# Patient Record
Sex: Female | Born: 1965 | Race: White | Hispanic: No | Marital: Married | Smoking: Never smoker
Health system: Southern US, Community
[De-identification: ages and names within clinical notes are randomized; demographics above are authoritative.]

## PROBLEM LIST (undated history)

## (undated) DIAGNOSIS — F3281 Premenstrual dysphoric disorder: Secondary | ICD-10-CM

## (undated) DIAGNOSIS — C801 Malignant (primary) neoplasm, unspecified: Secondary | ICD-10-CM

## (undated) DIAGNOSIS — E039 Hypothyroidism, unspecified: Secondary | ICD-10-CM

## (undated) DIAGNOSIS — M1990 Unspecified osteoarthritis, unspecified site: Secondary | ICD-10-CM

## (undated) DIAGNOSIS — G4730 Sleep apnea, unspecified: Secondary | ICD-10-CM

## (undated) DIAGNOSIS — E7800 Pure hypercholesterolemia, unspecified: Secondary | ICD-10-CM

## (undated) DIAGNOSIS — T7840XA Allergy, unspecified, initial encounter: Secondary | ICD-10-CM

## (undated) DIAGNOSIS — E079 Disorder of thyroid, unspecified: Secondary | ICD-10-CM

## (undated) DIAGNOSIS — D649 Anemia, unspecified: Secondary | ICD-10-CM

## (undated) HISTORY — PX: BASAL CELL CARCINOMA EXCISION: SHX1214

## (undated) HISTORY — DX: Premenstrual dysphoric disorder: F32.81

## (undated) HISTORY — DX: Allergy, unspecified, initial encounter: T78.40XA

## (undated) HISTORY — DX: Pure hypercholesterolemia, unspecified: E78.00

## (undated) HISTORY — DX: Unspecified osteoarthritis, unspecified site: M19.90

## (undated) HISTORY — PX: TONSILLECTOMY: SUR1361

## (undated) HISTORY — DX: Sleep apnea, unspecified: G47.30

---

## 1998-01-03 ENCOUNTER — Other Ambulatory Visit: Admission: RE | Admit: 1998-01-03 | Discharge: 1998-01-03 | Payer: Self-pay | Admitting: Obstetrics and Gynecology

## 1998-02-18 ENCOUNTER — Other Ambulatory Visit: Admission: RE | Admit: 1998-02-18 | Discharge: 1998-02-18 | Payer: Self-pay | Admitting: Obstetrics and Gynecology

## 1998-03-07 ENCOUNTER — Encounter: Payer: Self-pay | Admitting: Obstetrics and Gynecology

## 1998-03-07 ENCOUNTER — Ambulatory Visit (HOSPITAL_COMMUNITY): Admission: RE | Admit: 1998-03-07 | Discharge: 1998-03-07 | Payer: Self-pay | Admitting: Obstetrics and Gynecology

## 1998-04-03 ENCOUNTER — Inpatient Hospital Stay (HOSPITAL_COMMUNITY): Admission: AD | Admit: 1998-04-03 | Discharge: 1998-04-03 | Payer: Self-pay | Admitting: Gynecology

## 1998-04-10 ENCOUNTER — Inpatient Hospital Stay (HOSPITAL_COMMUNITY): Admission: AD | Admit: 1998-04-10 | Discharge: 1998-04-10 | Payer: Self-pay | Admitting: Obstetrics and Gynecology

## 1998-05-25 ENCOUNTER — Inpatient Hospital Stay (HOSPITAL_COMMUNITY): Admission: AD | Admit: 1998-05-25 | Discharge: 1998-05-25 | Payer: Self-pay | Admitting: Obstetrics and Gynecology

## 1998-06-01 ENCOUNTER — Inpatient Hospital Stay (HOSPITAL_COMMUNITY): Admission: AD | Admit: 1998-06-01 | Discharge: 1998-06-01 | Payer: Self-pay | Admitting: Obstetrics and Gynecology

## 1998-07-15 ENCOUNTER — Other Ambulatory Visit: Admission: RE | Admit: 1998-07-15 | Discharge: 1998-07-15 | Payer: Self-pay | Admitting: Gynecology

## 1999-02-12 ENCOUNTER — Inpatient Hospital Stay (HOSPITAL_COMMUNITY): Admission: AD | Admit: 1999-02-12 | Discharge: 1999-02-14 | Payer: Self-pay | Admitting: Obstetrics and Gynecology

## 1999-02-16 ENCOUNTER — Encounter: Admission: RE | Admit: 1999-02-16 | Discharge: 1999-05-17 | Payer: Self-pay | Admitting: Gynecology

## 1999-03-28 ENCOUNTER — Other Ambulatory Visit: Admission: RE | Admit: 1999-03-28 | Discharge: 1999-03-28 | Payer: Self-pay | Admitting: Gynecology

## 1999-11-07 ENCOUNTER — Ambulatory Visit (HOSPITAL_COMMUNITY): Admission: RE | Admit: 1999-11-07 | Discharge: 1999-11-07 | Payer: Self-pay | Admitting: Obstetrics and Gynecology

## 1999-11-07 ENCOUNTER — Encounter: Payer: Self-pay | Admitting: Obstetrics and Gynecology

## 2000-03-27 ENCOUNTER — Other Ambulatory Visit: Admission: RE | Admit: 2000-03-27 | Discharge: 2000-03-27 | Payer: Self-pay | Admitting: Obstetrics and Gynecology

## 2000-11-19 ENCOUNTER — Ambulatory Visit (HOSPITAL_COMMUNITY): Admission: RE | Admit: 2000-11-19 | Discharge: 2000-11-19 | Payer: Self-pay | Admitting: Obstetrics and Gynecology

## 2000-11-19 ENCOUNTER — Encounter: Payer: Self-pay | Admitting: Obstetrics and Gynecology

## 2001-04-02 ENCOUNTER — Other Ambulatory Visit: Admission: RE | Admit: 2001-04-02 | Discharge: 2001-04-02 | Payer: Self-pay | Admitting: Obstetrics and Gynecology

## 2001-12-02 ENCOUNTER — Ambulatory Visit (HOSPITAL_COMMUNITY): Admission: RE | Admit: 2001-12-02 | Discharge: 2001-12-02 | Payer: Self-pay | Admitting: Obstetrics and Gynecology

## 2001-12-02 ENCOUNTER — Encounter: Payer: Self-pay | Admitting: Obstetrics and Gynecology

## 2001-12-06 ENCOUNTER — Inpatient Hospital Stay (HOSPITAL_COMMUNITY): Admission: AD | Admit: 2001-12-06 | Discharge: 2001-12-06 | Payer: Self-pay | Admitting: Obstetrics and Gynecology

## 2002-05-06 ENCOUNTER — Other Ambulatory Visit: Admission: RE | Admit: 2002-05-06 | Discharge: 2002-05-06 | Payer: Self-pay | Admitting: Obstetrics and Gynecology

## 2003-01-29 ENCOUNTER — Ambulatory Visit (HOSPITAL_COMMUNITY): Admission: RE | Admit: 2003-01-29 | Discharge: 2003-01-29 | Payer: Self-pay | Admitting: Obstetrics and Gynecology

## 2003-01-29 ENCOUNTER — Encounter: Payer: Self-pay | Admitting: Obstetrics and Gynecology

## 2003-05-17 ENCOUNTER — Other Ambulatory Visit: Admission: RE | Admit: 2003-05-17 | Discharge: 2003-05-17 | Payer: Self-pay | Admitting: Obstetrics and Gynecology

## 2004-02-01 ENCOUNTER — Ambulatory Visit (HOSPITAL_COMMUNITY): Admission: RE | Admit: 2004-02-01 | Discharge: 2004-02-01 | Payer: Self-pay | Admitting: Obstetrics and Gynecology

## 2004-05-30 ENCOUNTER — Other Ambulatory Visit: Admission: RE | Admit: 2004-05-30 | Discharge: 2004-05-30 | Payer: Self-pay | Admitting: Obstetrics and Gynecology

## 2005-02-01 ENCOUNTER — Ambulatory Visit (HOSPITAL_COMMUNITY): Admission: RE | Admit: 2005-02-01 | Discharge: 2005-02-01 | Payer: Self-pay | Admitting: Obstetrics and Gynecology

## 2005-03-08 ENCOUNTER — Ambulatory Visit: Payer: Self-pay | Admitting: Internal Medicine

## 2005-06-11 ENCOUNTER — Other Ambulatory Visit: Admission: RE | Admit: 2005-06-11 | Discharge: 2005-06-11 | Payer: Self-pay | Admitting: Obstetrics and Gynecology

## 2006-02-04 ENCOUNTER — Ambulatory Visit (HOSPITAL_COMMUNITY): Admission: RE | Admit: 2006-02-04 | Discharge: 2006-02-04 | Payer: Self-pay | Admitting: Obstetrics and Gynecology

## 2006-03-08 ENCOUNTER — Ambulatory Visit: Payer: Self-pay | Admitting: Internal Medicine

## 2006-04-10 ENCOUNTER — Ambulatory Visit: Payer: Self-pay | Admitting: Internal Medicine

## 2006-06-12 ENCOUNTER — Other Ambulatory Visit: Admission: RE | Admit: 2006-06-12 | Discharge: 2006-06-12 | Payer: Self-pay | Admitting: Obstetrics and Gynecology

## 2006-07-09 ENCOUNTER — Ambulatory Visit: Payer: Self-pay | Admitting: Internal Medicine

## 2007-02-10 ENCOUNTER — Encounter: Admission: RE | Admit: 2007-02-10 | Discharge: 2007-02-10 | Payer: Self-pay | Admitting: Obstetrics and Gynecology

## 2007-07-16 ENCOUNTER — Other Ambulatory Visit: Admission: RE | Admit: 2007-07-16 | Discharge: 2007-07-16 | Payer: Self-pay | Admitting: Obstetrics and Gynecology

## 2007-12-31 ENCOUNTER — Encounter: Admission: RE | Admit: 2007-12-31 | Discharge: 2007-12-31 | Payer: Self-pay | Admitting: Obstetrics and Gynecology

## 2008-02-18 ENCOUNTER — Encounter: Admission: RE | Admit: 2008-02-18 | Discharge: 2008-02-18 | Payer: Self-pay | Admitting: Obstetrics and Gynecology

## 2008-02-27 ENCOUNTER — Encounter: Admission: RE | Admit: 2008-02-27 | Discharge: 2008-02-27 | Payer: Self-pay | Admitting: Obstetrics and Gynecology

## 2008-03-04 ENCOUNTER — Ambulatory Visit: Payer: Self-pay | Admitting: Internal Medicine

## 2008-07-29 ENCOUNTER — Ambulatory Visit: Payer: Self-pay | Admitting: Obstetrics and Gynecology

## 2008-07-29 ENCOUNTER — Encounter: Payer: Self-pay | Admitting: Obstetrics and Gynecology

## 2008-07-29 ENCOUNTER — Other Ambulatory Visit: Admission: RE | Admit: 2008-07-29 | Discharge: 2008-07-29 | Payer: Self-pay | Admitting: Obstetrics and Gynecology

## 2009-01-10 ENCOUNTER — Encounter: Payer: Self-pay | Admitting: Internal Medicine

## 2009-03-08 ENCOUNTER — Encounter: Admission: RE | Admit: 2009-03-08 | Discharge: 2009-03-08 | Payer: Self-pay | Admitting: Obstetrics and Gynecology

## 2009-06-22 ENCOUNTER — Ambulatory Visit: Payer: Self-pay | Admitting: Obstetrics and Gynecology

## 2009-08-03 ENCOUNTER — Other Ambulatory Visit: Admission: RE | Admit: 2009-08-03 | Discharge: 2009-08-03 | Payer: Self-pay | Admitting: Obstetrics and Gynecology

## 2009-08-03 ENCOUNTER — Ambulatory Visit: Payer: Self-pay | Admitting: Obstetrics and Gynecology

## 2010-03-20 ENCOUNTER — Encounter: Admission: RE | Admit: 2010-03-20 | Discharge: 2010-03-20 | Payer: Self-pay | Admitting: Obstetrics and Gynecology

## 2010-05-21 LAB — CONVERTED CEMR LAB
ALT: 16 units/L (ref 0–40)
Alkaline Phosphatase: 46 units/L (ref 39–117)
Basophils Absolute: 0 10*3/uL (ref 0.0–0.1)
CO2: 26 meq/L (ref 19–32)
Calcium: 9.2 mg/dL (ref 8.4–10.5)
GFR calc non Af Amer: 73 mL/min
Glomerular Filtration Rate, Af Am: 89 mL/min/{1.73_m2}
Glucose, Bld: 83 mg/dL (ref 70–99)
Lymphocytes Relative: 16.5 % (ref 12.0–46.0)
MCV: 83.2 fL (ref 78.0–100.0)
Monocytes Absolute: 0.2 10*3/uL (ref 0.2–0.7)
Monocytes Relative: 3.1 % (ref 3.0–11.0)
Platelets: 259 10*3/uL (ref 150–400)
Potassium: 4.2 meq/L (ref 3.5–5.1)
RBC: 4.44 M/uL (ref 3.87–5.11)
Sodium: 140 meq/L (ref 135–145)
TSH: 2.34 microintl units/mL (ref 0.35–5.50)
Total Protein: 6.7 g/dL (ref 6.0–8.3)
WBC: 7.8 10*3/uL (ref 4.5–10.5)

## 2010-05-23 ENCOUNTER — Other Ambulatory Visit: Payer: Self-pay | Admitting: Dermatology

## 2010-08-30 ENCOUNTER — Other Ambulatory Visit (HOSPITAL_COMMUNITY)
Admission: RE | Admit: 2010-08-30 | Discharge: 2010-08-30 | Disposition: A | Discharge status: Home / Self Care | Payer: BC Managed Care – PPO | Source: Ambulatory Visit | Attending: Obstetrics and Gynecology | Admitting: Obstetrics and Gynecology

## 2010-08-30 ENCOUNTER — Encounter (INDEPENDENT_AMBULATORY_CARE_PROVIDER_SITE_OTHER): Payer: BC Managed Care – PPO | Admitting: Obstetrics and Gynecology

## 2010-08-30 ENCOUNTER — Other Ambulatory Visit: Payer: Self-pay | Admitting: Obstetrics and Gynecology

## 2010-08-30 DIAGNOSIS — Z01419 Encounter for gynecological examination (general) (routine) without abnormal findings: Secondary | ICD-10-CM

## 2010-08-30 DIAGNOSIS — Z124 Encounter for screening for malignant neoplasm of cervix: Secondary | ICD-10-CM | POA: Insufficient documentation

## 2010-08-30 DIAGNOSIS — B373 Candidiasis of vulva and vagina: Secondary | ICD-10-CM

## 2010-09-25 ENCOUNTER — Other Ambulatory Visit (INDEPENDENT_AMBULATORY_CARE_PROVIDER_SITE_OTHER): Payer: BC Managed Care – PPO

## 2010-09-25 DIAGNOSIS — Z13220 Encounter for screening for lipoid disorders: Secondary | ICD-10-CM

## 2011-01-04 ENCOUNTER — Other Ambulatory Visit: Payer: Self-pay | Admitting: *Deleted

## 2011-01-04 MED ORDER — FLUTICASONE PROPIONATE 50 MCG/ACT NA SUSP: 1.0000 | Freq: Every day | NASAL | Status: DC

## 2011-02-14 ENCOUNTER — Other Ambulatory Visit: Payer: Self-pay | Admitting: Obstetrics and Gynecology

## 2011-02-14 DIAGNOSIS — Z1231 Encounter for screening mammogram for malignant neoplasm of breast: Secondary | ICD-10-CM

## 2011-03-29 ENCOUNTER — Ambulatory Visit
Admission: RE | Admit: 2011-03-29 | Discharge: 2011-03-29 | Disposition: A | Discharge status: Home / Self Care | Payer: BC Managed Care – PPO | Source: Ambulatory Visit | Attending: Obstetrics and Gynecology | Admitting: Obstetrics and Gynecology

## 2011-03-29 DIAGNOSIS — Z1231 Encounter for screening mammogram for malignant neoplasm of breast: Secondary | ICD-10-CM

## 2011-04-11 ENCOUNTER — Ambulatory Visit (INDEPENDENT_AMBULATORY_CARE_PROVIDER_SITE_OTHER): Payer: BC Managed Care – PPO | Admitting: Family Medicine

## 2011-04-11 ENCOUNTER — Telehealth: Payer: Self-pay

## 2011-04-11 ENCOUNTER — Encounter: Payer: Self-pay | Admitting: Family Medicine

## 2011-04-11 VITALS — BP 110/65 | HR 87 | Temp 99.2°F | Ht 63.5 in | Wt 195.6 lb

## 2011-04-11 DIAGNOSIS — J111 Influenza due to unidentified influenza virus with other respiratory manifestations: Secondary | ICD-10-CM

## 2011-04-11 MED ORDER — OSELTAMIVIR PHOSPHATE 75 MG PO CAPS: 75.0000 mg | ORAL_CAPSULE | Freq: Two times a day (BID) | ORAL | Status: AC

## 2011-04-11 MED ORDER — GUAIFENESIN-CODEINE 100-10 MG/5ML PO SYRP: 10.0000 mL | ORAL_SOLUTION | Freq: Three times a day (TID) | ORAL | Status: AC | PRN

## 2011-04-11 NOTE — Patient Instructions (Signed)
This is the flu You can return to work 24 hrs after you are fever free Alternate tylenol and ibuprofen every 4 hrs as needed for pain and fever Drink plenty of fluids REST! Take the Tamiflu as directed Use the cough meds as needed Call with any questions or concerns Hang in there! Happy Holidays!

## 2011-04-11 NOTE — Progress Notes (Signed)
  Subjective:    Patient ID: Jordan Vang, female    DOB: 08-Apr-1966, 45 y.o.   MRN: [id number]  HPI Flu- sxs started Monday morning.  + cough, body aches, chills, watery eyes.  Tm- 'low 100s'.  No known sick contacts.   Review of Systems For ROS see HPI     Objective:   Physical Exam  Vitals reviewed. Constitutional: She appears well-developed and well-nourished. No distress.  HENT:  Head: Normocephalic and atraumatic.  Nose: Nose normal.  Mouth/Throat: Oropharynx is clear and moist. No oropharyngeal exudate.       TMs normal bilaterally No TTP over sinuses  Eyes: Conjunctivae and EOM are normal. Pupils are equal, round, and reactive to light.  Neck: Normal range of motion. Neck supple.  Pulmonary/Chest: Effort normal and breath sounds normal. No respiratory distress. She has no wheezes. She has no rales.       + dry cough  Lymphadenopathy:    She has no cervical adenopathy.  Skin: Skin is warm.          Assessment & Plan:

## 2011-04-11 NOTE — Telephone Encounter (Signed)
Message left on voicemail: Flu-like symptoms, ? If OV needed  I called and spoke with patient, patient with body aches,fever,chills and cough since Monday, patient advise she will need OV to be tested for flu. Patient to see Dr.Tabori at 2:15 today.

## 2011-04-15 NOTE — Assessment & Plan Note (Signed)
Flu test +.  Start Tamiflu.  Reviewed supportive care and red flags that should prompt return.  Pt expressed understanding and is in agreement w/ plan.

## 2011-05-04 ENCOUNTER — Telehealth: Payer: Self-pay

## 2011-05-04 NOTE — Telephone Encounter (Signed)
Pt states she was seen on 04/11/11 and diagnosed with flu.  Pt states she was given Tamiflu and is feeling better.  Pt would like to know if she needs to get a flu shot.  Pt aware the flu shot is still needed.

## 2011-06-30 LAB — HM PAP SMEAR

## 2011-08-22 ENCOUNTER — Encounter: Payer: Self-pay | Admitting: Gynecology

## 2011-08-22 DIAGNOSIS — F3281 Premenstrual dysphoric disorder: Secondary | ICD-10-CM | POA: Insufficient documentation

## 2011-08-24 ENCOUNTER — Telehealth: Payer: Self-pay | Admitting: Internal Medicine

## 2011-08-24 ENCOUNTER — Ambulatory Visit (INDEPENDENT_AMBULATORY_CARE_PROVIDER_SITE_OTHER): Payer: BC Managed Care – PPO | Admitting: Internal Medicine

## 2011-08-24 ENCOUNTER — Encounter: Payer: Self-pay | Admitting: Internal Medicine

## 2011-08-24 VITALS — BP 118/76 | HR 61 | Temp 98.2°F | Wt 191.4 lb

## 2011-08-24 DIAGNOSIS — T148XXA Other injury of unspecified body region, initial encounter: Secondary | ICD-10-CM

## 2011-08-24 DIAGNOSIS — W57XXXA Bitten or stung by nonvenomous insect and other nonvenomous arthropods, initial encounter: Secondary | ICD-10-CM

## 2011-08-24 DIAGNOSIS — J310 Chronic rhinitis: Secondary | ICD-10-CM

## 2011-08-24 DIAGNOSIS — H1010 Acute atopic conjunctivitis, unspecified eye: Secondary | ICD-10-CM

## 2011-08-24 DIAGNOSIS — T148 Other injury of unspecified body region: Secondary | ICD-10-CM

## 2011-08-24 MED ORDER — DOXYCYCLINE HYCLATE 100 MG PO TABS: 100.0000 mg | ORAL_TABLET | Freq: Two times a day (BID) | ORAL | Status: AC

## 2011-08-24 NOTE — Telephone Encounter (Signed)
Caller: Rekisha/Patient; PCP: Marga Melnick; CB#: [phone]; Call regarding Tick Bite Monday- 08/09/11 found tick on waist. She pulled it out and now has a little red mark. Flushed tick, not sure if she got whole thing.  Having Symptoms of Fatigue, achiness, Sore throat and Congestion onset- 08/19/11. Taking Muscinex- headaches on and off but was having them before bite d/t vision/ problems with contacts. Care advice per Bites and Stings Protocol and appnt scheduled for 1100- 08/24/11.

## 2011-08-24 NOTE — Progress Notes (Signed)
  Subjective:    Patient ID: Jordan Vang, female    DOB: 1966-01-27, 46 y.o.   MRN: [id number]  HPI 15 days ago she was bitten by a tick; she had no significant sequela until 4/29 when she developed myalgias , arthralgias as well as fatigue. Subsequently she developed some head & chest  congestion    Review of Systems she denies frontal headache, facial pain, dental pain, earache or otic discharge. She's had some yellow nasal drainage in the morning. She describes chest tightness without sputum production. She's had some nausea related to postnasal drainage. She states "I would not be here except for the tick bite".  She is using Allegra and Flonase for itchy eyes and sneezing. She also has eye drops.     Objective:   Physical Exam General appearance:good health ;well nourished; no acute distress or increased work of breathing is present.  No  lymphadenopathy about the head, neck, or axilla noted.   Eyes: No conjunctival inflammation or lid edema is present.   Ears:  External ear exam shows no significant lesions or deformities.  Otoscopic examination reveals clear canals, tympanic membranes are intact bilaterally without bulging, retraction, inflammation or discharge.  Nose:  External nasal examination shows no deformity or inflammation. Nasal mucosa are pink and moist without lesions or exudates. No septal dislocation or deviation.No obstruction to airflow.   Oral exam: Dental hygiene is good; lips and gums are healthy appearing.There is no oropharyngeal erythema or exudate noted.   Neck:  No deformities, masses, or tenderness noted.   Supple with full range of motion without pain.   Heart:  Normal rate and regular rhythm. S1 and S2 normal without gallop, murmur, click, rub or other extra sounds.   Lungs:Chest clear to auscultation; no wheezes, rhonchi,rales ,or rubs present.No increased work of breathing.    Extremities:  No cyanosis, edema, or clubbing  noted    Skin: Warm & dry  . 4 x 3 mm area of erythema above the right hip          Assessment & Plan:  #1 tick bite 15 days ago; no significant cellulitis  #2 respiratory tract symptoms with significant extrinsic component.  Plan: See orders and recommendations

## 2011-08-24 NOTE — Patient Instructions (Signed)
Plain Mucinex for thick secretions ;force NON dairy fluids . Use a Neti pot daily as needed for sinus congestion; going from open side to congested side . Nasal cleansing in the shower as discussed. Make sure that all residual soap is removed to prevent irritation. Fluticasone 1 spray in each nostril twice a day as needed. Use the "crossover" technique as discussed.  Avoid sun while on the doxycycline

## 2011-09-04 ENCOUNTER — Ambulatory Visit (INDEPENDENT_AMBULATORY_CARE_PROVIDER_SITE_OTHER): Payer: BC Managed Care – PPO | Admitting: Obstetrics and Gynecology

## 2011-09-04 ENCOUNTER — Encounter: Payer: Self-pay | Admitting: Obstetrics and Gynecology

## 2011-09-04 VITALS — BP 120/76 | Ht 63.0 in | Wt 189.0 lb

## 2011-09-04 DIAGNOSIS — Z01419 Encounter for gynecological examination (general) (routine) without abnormal findings: Secondary | ICD-10-CM

## 2011-09-04 DIAGNOSIS — E7800 Pure hypercholesterolemia, unspecified: Secondary | ICD-10-CM

## 2011-09-04 LAB — CBC WITH DIFFERENTIAL/PLATELET
Basophils Absolute: 0.1 10*3/uL (ref 0.0–0.1)
Basophils Relative: 1 % (ref 0–1)
HCT: 37.6 % (ref 36.0–46.0)
MCHC: 32.7 g/dL (ref 30.0–36.0)
Monocytes Absolute: 0.6 10*3/uL (ref 0.1–1.0)
Neutro Abs: 5.5 10*3/uL (ref 1.7–7.7)
Neutrophils Relative %: 65 % (ref 43–77)
Platelets: 309 10*3/uL (ref 150–400)
RDW: 13.8 % (ref 11.5–15.5)

## 2011-09-04 MED ORDER — FLUTICASONE PROPIONATE 50 MCG/ACT NA SUSP: 1.0000 | Freq: Every day | NASAL | Status: DC

## 2011-09-04 MED ORDER — DROSPIRENONE-ETHINYL ESTRADIOL 3-0.03 MG PO TABS: 1.0000 | ORAL_TABLET | Freq: Every day | ORAL | Status: DC

## 2011-09-04 MED ORDER — SERTRALINE HCL 25 MG PO TABS: 25.0000 mg | ORAL_TABLET | Freq: Every day | ORAL | Status: DC

## 2011-09-04 NOTE — Patient Instructions (Signed)
Return fasting for lipid profile. 

## 2011-09-04 NOTE — Progress Notes (Signed)
Patient came to see me today for her annual GYN exam. Her periods are fine on birth control pills. She is having no abnormal bleeding. She is having no pelvic pain. She takes Zoloft for PMDD with excellent results but it makes her tired. She really only has 2 days of PMDD but takes Zoloft every day of the year. She is up-to-date on mammograms. Last year her cholesterol was fine but her triglycerides were elevated at 349. The patient did not fast today. She is having no other GYN problems. Patient uses Flonase for seasonally with excellent results.  Physical examination: Kennon Portela present. HEENT within normal limits. Neck: Thyroid not large. No masses. Supraclavicular nodes: not enlarged. Breasts: Examined in both sitting and lying  position. No skin changes and no masses. Abdomen: Soft no guarding rebound or masses or hernia. Pelvic: External: Within normal limits. BUS: Within normal limits. Vaginal:within normal limits. Good estrogen effect. No evidence of cystocele rectocele or enterocele. Cervix: clean. Uterus: Normal size and shape. Adnexa: No masses. Rectovaginal exam: Confirmatory and negative. Extremities: Within normal limits.  Assessment: #1. PMDD #2. Elevated triglycerides #3. Allergies  Plan: Patient will only take her Zoloft cyclically to avoid the fatigue. She will continue yearly mammograms. She'll return fasting for lipid profile. She will continue Flonase that she uses seasonally.

## 2011-09-05 LAB — URINALYSIS W MICROSCOPIC + REFLEX CULTURE
Bacteria, UA: NONE SEEN
Bilirubin Urine: NEGATIVE
Casts: NONE SEEN
Crystals: NONE SEEN
Glucose, UA: NEGATIVE mg/dL
Hgb urine dipstick: NEGATIVE
Ketones, ur: NEGATIVE mg/dL
Specific Gravity, Urine: 1.018 (ref 1.005–1.030)
pH: 6 (ref 5.0–8.0)

## 2011-09-12 ENCOUNTER — Telehealth: Payer: Self-pay | Admitting: *Deleted

## 2011-09-12 NOTE — Telephone Encounter (Signed)
Pt informed with the below note. 

## 2011-09-12 NOTE — Telephone Encounter (Signed)
I. Would try just to switch to peak times now. I doubt if she'll have side effects. If she starts to feel funny in any way call me back and we will try differently.

## 2011-09-12 NOTE — Telephone Encounter (Signed)
Pt is currently taking her Zoloft 25 mg for her PMDD. She said at OV you spoke with her about taking medication only peak times of the months, she would like to know if she would need to wean herself on medication or could she just stop Zoloft and take at the peak times of the month? She has been taking medication daily for sometime now. Please advise

## 2011-09-19 ENCOUNTER — Other Ambulatory Visit: Payer: BC Managed Care – PPO

## 2011-09-19 DIAGNOSIS — E7800 Pure hypercholesterolemia, unspecified: Secondary | ICD-10-CM

## 2011-09-19 LAB — LIPID PANEL
LDL Cholesterol: 128 mg/dL — ABNORMAL HIGH (ref 0–99)
Triglycerides: 203 mg/dL — ABNORMAL HIGH (ref <150)

## 2011-09-20 ENCOUNTER — Other Ambulatory Visit: Payer: Self-pay | Admitting: *Deleted

## 2011-09-20 DIAGNOSIS — E7800 Pure hypercholesterolemia, unspecified: Secondary | ICD-10-CM

## 2011-11-01 ENCOUNTER — Other Ambulatory Visit: Payer: Self-pay | Admitting: Dermatology

## 2012-02-22 ENCOUNTER — Other Ambulatory Visit: Payer: Self-pay | Admitting: Obstetrics and Gynecology

## 2012-02-22 DIAGNOSIS — Z1231 Encounter for screening mammogram for malignant neoplasm of breast: Secondary | ICD-10-CM

## 2012-05-23 ENCOUNTER — Ambulatory Visit: Payer: BC Managed Care – PPO

## 2012-05-30 ENCOUNTER — Ambulatory Visit
Admission: RE | Admit: 2012-05-30 | Discharge: 2012-05-30 | Disposition: A | Discharge status: Home / Self Care | Payer: BC Managed Care – PPO | Source: Ambulatory Visit | Attending: Obstetrics and Gynecology | Admitting: Obstetrics and Gynecology

## 2012-05-30 DIAGNOSIS — Z1231 Encounter for screening mammogram for malignant neoplasm of breast: Secondary | ICD-10-CM

## 2012-08-29 ENCOUNTER — Encounter: Payer: Self-pay | Admitting: Gynecology

## 2012-09-16 ENCOUNTER — Encounter: Payer: Self-pay | Admitting: Gynecology

## 2012-09-16 ENCOUNTER — Ambulatory Visit (INDEPENDENT_AMBULATORY_CARE_PROVIDER_SITE_OTHER): Payer: BC Managed Care – PPO | Admitting: Gynecology

## 2012-09-16 VITALS — BP 124/76 | Ht 63.0 in | Wt 189.0 lb

## 2012-09-16 DIAGNOSIS — Z309 Encounter for contraceptive management, unspecified: Secondary | ICD-10-CM

## 2012-09-16 DIAGNOSIS — F3281 Premenstrual dysphoric disorder: Secondary | ICD-10-CM

## 2012-09-16 DIAGNOSIS — N951 Menopausal and female climacteric states: Secondary | ICD-10-CM

## 2012-09-16 DIAGNOSIS — N943 Premenstrual tension syndrome: Secondary | ICD-10-CM

## 2012-09-16 DIAGNOSIS — Z01419 Encounter for gynecological examination (general) (routine) without abnormal findings: Secondary | ICD-10-CM

## 2012-09-16 DIAGNOSIS — Z13220 Encounter for screening for lipoid disorders: Secondary | ICD-10-CM

## 2012-09-16 LAB — CBC WITH DIFFERENTIAL/PLATELET
Basophils Relative: 1 % (ref 0–1)
HCT: 36.8 % (ref 36.0–46.0)
Hemoglobin: 12.5 g/dL (ref 12.0–15.0)
Lymphocytes Relative: 26 % (ref 12–46)
MCHC: 34 g/dL (ref 30.0–36.0)
MCV: 79.8 fL (ref 78.0–100.0)
Monocytes Absolute: 0.5 10*3/uL (ref 0.1–1.0)
Monocytes Relative: 6 % (ref 3–12)
Neutro Abs: 5 10*3/uL (ref 1.7–7.7)

## 2012-09-16 LAB — COMPREHENSIVE METABOLIC PANEL
Albumin: 3.6 g/dL (ref 3.5–5.2)
Alkaline Phosphatase: 51 U/L (ref 39–117)
BUN: 12 mg/dL (ref 6–23)
Creat: 0.75 mg/dL (ref 0.50–1.10)
Glucose, Bld: 83 mg/dL (ref 70–99)
Potassium: 4.1 mEq/L (ref 3.5–5.3)
Total Bilirubin: 0.4 mg/dL (ref 0.3–1.2)

## 2012-09-16 LAB — LIPID PANEL
Cholesterol: 235 mg/dL — ABNORMAL HIGH (ref 0–200)
HDL: 57 mg/dL (ref >39)
Triglycerides: 179 mg/dL — ABNORMAL HIGH (ref <150)
VLDL: 36 mg/dL (ref 0–40)

## 2012-09-16 MED ORDER — SERTRALINE HCL 25 MG PO TABS: 25.0000 mg | ORAL_TABLET | Freq: Every day | ORAL | Status: DC

## 2012-09-16 MED ORDER — DROSPIRENONE-ETHINYL ESTRADIOL 3-0.03 MG PO TABS: 1.0000 | ORAL_TABLET | Freq: Every day | ORAL | Status: DC

## 2012-09-16 NOTE — Patient Instructions (Signed)
Followup in 1 year for exam. Followup for Mirena IUD if you choose.

## 2012-09-16 NOTE — Progress Notes (Signed)
Jordan Vang 08/12/1965 [id number]        47 y.o.  G1P1001 for annual exam.  Former patient of Dr. Eda Vang. Several issues noted below.  Past medical history,surgical history, medications, allergies, family history and social history were all reviewed and documented in the EPIC chart. ROS:  Was performed and pertinent positives and negatives are included in the history.  Exam: Jordan Vang assistant Filed Vitals:   09/16/12 0959  BP: 124/76  Height: 5\' 3"  (1.6 m)  Weight: 189 lb (85.73 kg)   General appearance  Normal Skin grossly normal Head/Neck normal with no cervical or supraclavicular adenopathy thyroid normal Lungs  clear Cardiac RR, without RMG Abdominal  soft, nontender, without masses, organomegaly or hernia Breasts  examined lying and sitting without masses, retractions, discharge or axillary adenopathy. Pelvic  Ext/BUS/vagina  normal   Cervix  normal   Uterus  anteverted, normal size, shape and contour, midline and mobile nontender   Adnexa  Without masses or tenderness    Anus and perineum  normal   Rectovaginal  normal sphincter tone without palpated masses or tenderness.    Assessment/Plan:  47 y.o. G28P1001 female for annual exam.   1. Yaz BCPs. Patient on Yaz equivalent BCPs.  Has been for years. I reviewed the issues and risks of BCPs in general and possible increased risk with Yaz to include stroke heart attack DVT. Alternative contraception options were reviewed to include IUD. I gave her literature on the Mirena IUD to consider. I refilled her Dianah Field today as she wants to continue and accepts the risks. Options for every other to every third month withdrawal discussed. Off brand labeling issues reviewed. 2. PMDD. Takes Zoloft 25 mg daily. Does well with this and wants to continue. Has been using for several years. Attempted to try taking only premenstrually and had side effects when she stopped them and prefers to take them daily.  I refilled her time to year. Having no  side effect issues with these. 3. Occasional hot flash. Does not seem worse during the pill free week. We'll check baseline TSH. 4. Mammography 05/2012. Continued annual mammography. SBE monthly reviewed. 5. Pap smear 2012. No Pap smear done today. No history of abnormal Pap smears. Plan repeat Pap smear next year at a 3 year interval. 6. Health maintenance. Baseline CBC comprehensive metabolic panel lipid profile urinalysis ordered along with her TSH. Followup 1 year, sooner she wants to proceed with the Mirena IUD.    Jordan Lords MD, 10:37 AM 09/16/2012  #6a

## 2012-09-17 ENCOUNTER — Other Ambulatory Visit: Payer: Self-pay | Admitting: Gynecology

## 2012-09-17 ENCOUNTER — Telehealth: Payer: Self-pay | Admitting: Gynecology

## 2012-09-17 DIAGNOSIS — E785 Hyperlipidemia, unspecified: Secondary | ICD-10-CM

## 2012-09-17 LAB — URINALYSIS W MICROSCOPIC + REFLEX CULTURE
Bacteria, UA: NONE SEEN
Bilirubin Urine: NEGATIVE
Casts: NONE SEEN
Crystals: NONE SEEN
Hgb urine dipstick: NEGATIVE
Ketones, ur: NEGATIVE mg/dL
Specific Gravity, Urine: 1.023 (ref 1.005–1.030)
Urobilinogen, UA: 0.2 mg/dL (ref 0.0–1.0)

## 2012-09-17 NOTE — Telephone Encounter (Signed)
09/17/12-Pt was advised today that her [facility] will pay for Mirena only after she has met her $5,450.00 deductible. To date only $370.86 met so she would have to pay $1,455.00 the total cost of the Mirena & insertion. She will get back to [place] if she wants to proceed.WL

## 2012-09-30 ENCOUNTER — Telehealth: Payer: Self-pay | Admitting: Internal Medicine

## 2012-09-30 MED ORDER — CIPROFLOXACIN HCL 500 MG PO TABS: 500.0000 mg | ORAL_TABLET | Freq: Two times a day (BID) | ORAL | Status: DC

## 2012-09-30 NOTE — Telephone Encounter (Signed)
Same cipro Rx OK

## 2012-09-30 NOTE — Telephone Encounter (Signed)
Left message on VM informing patient rx sent in

## 2012-09-30 NOTE — Telephone Encounter (Signed)
Jordan Vang/Patient [phone] called to request Rx for Cipro.  Leaving 10/04/12 for week in friend's home in [place].  Friend suggested she have Cipro in case of illness.  [employer]. Please call back to advise if MD approves or disapproves having Rx on hand.

## 2012-12-29 ENCOUNTER — Other Ambulatory Visit: Payer: Self-pay

## 2012-12-29 MED ORDER — FLUTICASONE PROPIONATE 50 MCG/ACT NA SUSP: 1.0000 | Freq: Every day | NASAL | Status: DC

## 2012-12-29 NOTE — Telephone Encounter (Signed)
Former Patient of Dr. Reece Agar.  Saw Dr. Velvet Bathe on 09/15/12 for CE.  At 09/04/11 CE Dr. Reece Agar wrote "Patient uses Flonase for seasonally with excellent results."

## 2013-03-11 ENCOUNTER — Other Ambulatory Visit: Payer: BC Managed Care – PPO

## 2013-03-11 DIAGNOSIS — E785 Hyperlipidemia, unspecified: Secondary | ICD-10-CM

## 2013-03-11 LAB — LIPID PANEL: HDL: 60 mg/dL (ref >39)

## 2013-04-23 HISTORY — PX: KNEE SURGERY: SHX244

## 2013-04-29 ENCOUNTER — Other Ambulatory Visit: Payer: Self-pay

## 2013-04-29 DIAGNOSIS — Z1231 Encounter for screening mammogram for malignant neoplasm of breast: Secondary | ICD-10-CM

## 2013-06-05 ENCOUNTER — Ambulatory Visit: Payer: BC Managed Care – PPO

## 2013-06-19 ENCOUNTER — Ambulatory Visit: Payer: BC Managed Care – PPO

## 2013-06-29 ENCOUNTER — Encounter: Payer: Self-pay | Admitting: Internal Medicine

## 2013-06-29 ENCOUNTER — Ambulatory Visit (INDEPENDENT_AMBULATORY_CARE_PROVIDER_SITE_OTHER): Payer: BC Managed Care – PPO | Admitting: Internal Medicine

## 2013-06-29 VITALS — BP 110/65 | Temp 98.0°F | Resp 14

## 2013-06-29 DIAGNOSIS — J209 Acute bronchitis, unspecified: Secondary | ICD-10-CM

## 2013-06-29 MED ORDER — AZITHROMYCIN 250 MG PO TABS: ORAL_TABLET | ORAL | Status: DC

## 2013-06-29 MED ORDER — HYDROCODONE-HOMATROPINE 5-1.5 MG/5ML PO SYRP: 5.0000 mL | ORAL_SOLUTION | Freq: Four times a day (QID) | ORAL | Status: DC | PRN

## 2013-06-29 NOTE — Progress Notes (Signed)
   Subjective:    Patient ID: Jordan Vang, female    DOB: 16-Jan-1966, 48 y.o.   MRN: [id number]  HPI  Symptoms began 06/23/13 as minor rhinitis, itchy, watery eyes, and sneezing as well as low-grade fever, chills, sweats. Significant symptoms included sore throat and chest congestion. Her cough is productive of clear to yellow sputum.  She had been exposed to sick work associates  Delsym, Human resources officer, Porcupine, DayQuil, Mucinex were of minimal benefit. Nonsteroidals were of more help than the other agents  At this time she continues to have some mild frontal sinus headache pain as well as some maxillary sinus area pain. Sore throat continues. The major issue remains the cough with clear yellow sputum. She's had some mild wheezing shortness of breath.  She also describes myalgias and arthralgias. She did take the flu shot   Review of Systems   There is no associated otic pain or discharge  The sinus symptoms are pressure without severe pain or nasal purulence.    Objective:   Physical Exam  General appearance:good health ;well nourished; no acute distress or increased work of breathing is present.  No  lymphadenopathy about the head, neck, or axilla noted.   Eyes: No conjunctival inflammation or lid edema is present. There is no scleral icterus.  Ears:  External ear exam shows no significant lesions or deformities.  Otoscopic examination reveals clear canals, tympanic membranes are intact bilaterally without bulging, retraction, inflammation or discharge.  Nose:  External nasal examination shows no deformity or inflammation. Nasal mucosa are pink and moist without lesions or exudates. No septal dislocation or deviation.No obstruction to airflow.   Oral exam: Dental hygiene is good; lips and gums are healthy appearing.There is no oropharyngeal erythema or exudate noted.   Neck:  No deformities, thyromegaly, masses, or tenderness noted.   Supple with full range of motion without pain.    Heart:  Normal rate and regular rhythm. S1 and S2 normal without gallop, murmur, click, rub or other extra sounds.   Lungs:Chest clear to auscultation; no wheezes, rhonchi,rales ,or rubs present.No increased work of breathing. Paroxysms of severe racking cough which was nonproductive    Extremities:  No cyanosis, edema, or clubbing  noted    Skin: Warm & dry w/o jaundice or tenting.          Assessment & Plan:   #1 acute bronchitis  #2 upper respiratory tract congestion without rhinosinusitis  Plan: See orders

## 2013-06-29 NOTE — Patient Instructions (Signed)

## 2013-06-29 NOTE — Progress Notes (Signed)
Pre visit review using our clinic review tool, if applicable. No additional management support is needed unless otherwise documented below in the visit note. 

## 2013-07-02 ENCOUNTER — Other Ambulatory Visit: Payer: Self-pay | Admitting: Internal Medicine

## 2013-07-02 ENCOUNTER — Telehealth: Payer: Self-pay | Admitting: Internal Medicine

## 2013-07-02 MED ORDER — BENZONATATE 200 MG PO CAPS: 200.0000 mg | ORAL_CAPSULE | Freq: Three times a day (TID) | ORAL | Status: DC | PRN

## 2013-07-02 NOTE — Telephone Encounter (Signed)
Patient called to inquire about being rx's Tessalon cough syrup to manage her cough during the day. She says that she does not take the Hydrocodone cough syrup during the day because she is afraid she might get dizzy. She has been told by friends that Tessalon might help and wants to know if Dr. Linna Darner will prescribe. Please advise.

## 2013-07-06 ENCOUNTER — Telehealth: Payer: Self-pay | Admitting: *Deleted

## 2013-07-06 NOTE — Telephone Encounter (Signed)
Pt called requesting last Tetanus.  After review of chart there is no Tetanus documented.  Left message for pt to return cal to [facility].

## 2013-07-10 ENCOUNTER — Ambulatory Visit: Payer: BC Managed Care – PPO

## 2013-07-28 ENCOUNTER — Ambulatory Visit (INDEPENDENT_AMBULATORY_CARE_PROVIDER_SITE_OTHER): Payer: BC Managed Care – PPO | Admitting: Gynecology

## 2013-07-28 DIAGNOSIS — Z23 Encounter for immunization: Secondary | ICD-10-CM

## 2013-07-30 ENCOUNTER — Ambulatory Visit
Admission: RE | Admit: 2013-07-30 | Discharge: 2013-07-30 | Disposition: A | Discharge status: Home / Self Care | Payer: BC Managed Care – PPO | Source: Ambulatory Visit

## 2013-07-30 DIAGNOSIS — Z1231 Encounter for screening mammogram for malignant neoplasm of breast: Secondary | ICD-10-CM

## 2013-09-18 ENCOUNTER — Encounter: Payer: Self-pay | Admitting: Gynecology

## 2013-09-18 ENCOUNTER — Ambulatory Visit (INDEPENDENT_AMBULATORY_CARE_PROVIDER_SITE_OTHER): Payer: BC Managed Care – PPO | Admitting: Gynecology

## 2013-09-18 ENCOUNTER — Other Ambulatory Visit (HOSPITAL_COMMUNITY)
Admission: RE | Admit: 2013-09-18 | Discharge: 2013-09-18 | Disposition: A | Discharge status: Home / Self Care | Payer: BC Managed Care – PPO | Source: Ambulatory Visit | Attending: Gynecology | Admitting: Gynecology

## 2013-09-18 VITALS — BP 120/76 | Ht 63.0 in | Wt 197.0 lb

## 2013-09-18 DIAGNOSIS — F3281 Premenstrual dysphoric disorder: Secondary | ICD-10-CM

## 2013-09-18 DIAGNOSIS — Z1151 Encounter for screening for human papillomavirus (HPV): Secondary | ICD-10-CM | POA: Insufficient documentation

## 2013-09-18 DIAGNOSIS — Z01419 Encounter for gynecological examination (general) (routine) without abnormal findings: Secondary | ICD-10-CM | POA: Insufficient documentation

## 2013-09-18 DIAGNOSIS — N943 Premenstrual tension syndrome: Secondary | ICD-10-CM

## 2013-09-18 MED ORDER — DROSPIRENONE-ETHINYL ESTRADIOL 3-0.03 MG PO TABS: 1.0000 | ORAL_TABLET | Freq: Every day | ORAL | Status: DC

## 2013-09-18 MED ORDER — SERTRALINE HCL 25 MG PO TABS: 25.0000 mg | ORAL_TABLET | Freq: Every day | ORAL | Status: DC

## 2013-09-18 NOTE — Progress Notes (Signed)
Jordan Vang 19-Apr-1966 [id number]        48 y.o.  G1P1001 for annual exam.  Doing well. Several issues noted below.  Past medical history,surgical history, problem list, medications, allergies, family history and social history were all reviewed and documented as reviewed in the EPIC chart.  ROS:  12 system ROS performed with pertinent positives and negatives included in the history, assessment and plan.  Included Systems: General, HEENT, Neck, Cardiovascular, Pulmonary, Gastrointestinal, Genitourinary, Musculoskeletal, Dermatologic, Endocrine, Hematological, Neurologic, Psychiatric Additional significant findings :  None   Exam: Kim assistant Filed Vitals:   09/18/13 0914  BP: 120/76  Height: 5\' 3"  (1.6 m)  Weight: 197 lb (89.359 kg)   General appearance:  Normal affect, orientation and appearance. Skin: Grossly normal HEENT: Without gross lesions.  No cervical or supraclavicular adenopathy. Thyroid normal.  Lungs:  Clear without wheezing, rales or rhonchi Cardiac: RR, without RMG Abdominal:  Soft, nontender, without masses, guarding, rebound, organomegaly or hernia Breasts:  Examined lying and sitting without masses, retractions, discharge or axillary adenopathy. Pelvic:  Ext/BUS/vagina normal  Cervix normal. Pap/HPV  Uterus anteverted, normal size, shape and contour, midline and mobile nontender   Adnexa  Without masses or tenderness    Anus and perineum  Normal   Rectovaginal  Normal sphincter tone without palpated masses or tenderness.    Assessment/Plan:  48 y.o. G26P1001 female for annual exam with regular menses, Yaz BCPs.   1. Yaz BCPs. I again reviewed the increased risk of stroke heart attack DVT associated with oral contraceptives, possibly advancing age and drospirenone. Options for alternative contraception reviewed or switching to a different pill. Patient has been on this for years, doing well and wants to continue. She clearly understands the issues and risks and  I refilled her x1 year. 2. PMDD. On Zoloft 25 mg daily doing well and wanting to continue. Refill x1 year prescribed. 3. Pap smear 2012. Pap smear/HPV today. No history of abnormal Pap smears previously. Plan repeat in 3-5 year interval is normal. 4. Mammography 07/2013. Continue with annual mammography. SBE monthly reviewed. 5. Health maintenance. Is in the process of arranging appointment with primary physician and will have her lab work done at her office. She does have a borderline cholesterol/LDL she can followup with them for this. Followup one year, sooner as needed.   Note: This document was prepared with digital dictation and possible smart phrase technology. Any transcriptional errors that result from this process are unintentional.   Anastasio Auerbach MD, 9:55 AM 09/18/2013

## 2013-09-18 NOTE — Addendum Note (Signed)
Addended by: Nelva Nay on: 09/18/2013 10:09 AM   Modules accepted: Orders

## 2013-09-18 NOTE — Addendum Note (Signed)
Addended by: Nelva Nay on: 09/18/2013 10:05 AM   Modules accepted: Orders

## 2013-09-18 NOTE — Patient Instructions (Signed)
Drospirenone; Ethinyl Estradiol tablets What is this medicine? DROSPIRENONE; ETHINYL ESTRADIOL (dro SPY re nown; ETH in il es tra DYE ole) is an oral contraceptive (birth control pill). This medicine combines two types of female hormones, an estrogen and a progestin. It is used to prevent ovulation and pregnancy. This medicine may be used for other purposes; ask your health care provider or pharmacist if you have questions. COMMON BRAND NAME(S): Angelica Chessman  What should I tell my health care provider before I take this medicine? They need to know if you have or ever had any of these conditions: -abnormal vaginal bleeding -adrenal gland disease -blood vessel disease or blood clots -breast, cervical, endometrial, ovarian, liver, or uterine cancer -diabetes -gallbladder disease -heart disease or recent heart attack -high blood pressure -high cholesterol -high potassium level -kidney disease -liver disease -migraine headaches -stroke -systemic lupus erythematosus (SLE) -tobacco smoker -an unusual or allergic reaction to estrogens, progestins, or other medicines, foods, dyes, or preservatives -pregnant or trying to get pregnant -breast-feeding How should I use this medicine? Take this medicine by mouth. To reduce nausea, this medicine may be taken with food. Follow the directions on the prescription label. Take this medicine at the same time each day and in the order directed on the package. Do not take your medicine more often than directed. A patient package insert for the product will be given with each prescription and refill. Read this sheet carefully each time. The sheet may change frequently. Talk to your pediatrician regarding the use of this medicine in children. Special care may be needed. This medicine has been used in female children who have started having menstrual periods. Overdosage: If you think you have taken too much of  this medicine contact a poison control center or emergency room at once. NOTE: This medicine is only for you. Do not share this medicine with others. What if I miss a dose? If you miss a dose, refer to the patient information sheet you received with your medicine for direction. If you miss more than one pill, this medicine may not be as effective and you may need to use another form of birth control. What may interact with this medicine? -acetaminophen -antibiotics or medicines for infections, especially rifampin, rifabutin, rifapentine, and griseofulvin, and possibly penicillins or tetracyclines -aprepitant -ascorbic acid (vitamin C) -atorvastatin -barbiturate medicines, such as phenobarbital -bosentan -carbamazepine -caffeine -clofibrate -cyclosporine -dantrolene -doxercalciferol -felbamate -grapefruit juice -hydrocortisone -medicines for anxiety or sleeping problems, such as diazepam or temazepam -medicines for diabetes, including pioglitazone -mineral oil -modafinil -mycophenolate -nefazodone -oxcarbazepine -phenytoin -prednisolone -ritonavir or other medicines for HIV infection or AIDS -rosuvastatin -selegiline -soy isoflavones supplements -St. John's wort -tamoxifen or raloxifene -theophylline -thyroid hormones -topiramate -warfarin This product is different from other birth control pills because it contains the progestin drospirenone. Drospirenone may increase potassium levels. Interactions with other drugs may increase the chance of an elevated potassium level. You may need blood tests to check your potassium level. Drugs that can increase the potassium level include: -certain medications for high blood pressure or heart conditions (examples include ACE-inhibitors and also Angiotensin-II receptor blockers, and Eplerenone -dietary salt substitutes (these may contain potassium) -heparin -NSAIDs (antiinflammatory drugs), if they are taken long-term and daily, like for  arthritis -potassium supplements -some 'water pills' (diuretics like amiloride, spironolactone or triamterene) This list may not describe all possible interactions. Give your health care provider a list of all the medicines, herbs, non-prescription drugs, or  dietary supplements you use. Also tell them if you smoke, drink alcohol, or use illegal drugs. Some items may interact with your medicine. What should I watch for while using this medicine? Visit your doctor or health care professional for regular checks on your progress. You will need a regular breast and pelvic exam and Pap smear while on this medicine. Use an additional method of contraception during the first cycle that you take these tablets. If you have any reason to think you are pregnant, stop taking this medicine right away and contact your doctor or health care professional. If you are taking this medicine for hormone related problems, it may take several cycles of use to see improvement in your condition. Smoking increases the risk of getting a blood clot or having a stroke while you are taking birth control pills, especially if you are more than 49 years old. You are strongly advised not to smoke. This medicine can make your body retain fluid, making your fingers, hands, or ankles swell. Your blood pressure can go up. Contact your doctor or health care professional if you feel you are retaining fluid. This medicine can make you more sensitive to the sun. Keep out of the sun. If you cannot avoid being in the sun, wear protective clothing and use sunscreen. Do not use sun lamps or tanning beds/booths. If you wear contact lenses and notice visual changes, or if the lenses begin to feel uncomfortable, consult your eye care specialist. In some women, tenderness, swelling, or minor bleeding of the gums may occur. Notify your dentist if this happens. Brushing and flossing your teeth regularly may help limit this. See your dentist regularly and  inform your dentist of the medicines you are taking. If you are going to have elective surgery, you may need to stop taking this medicine before the surgery. Consult your health care professional for advice. This medicine does not protect you against HIV infection (AIDS) or any other sexually transmitted diseases. What side effects may I notice from receiving this medicine? Side effects that you should report to your doctor or health care professional as soon as possible: -allergic reactions like skin rash, itching or hives, swelling of the face, lips, or tongue -breast tissue changes or discharge -changes in vision -chest pain -confusion, trouble speaking or understanding -dark urine -general ill feeling or flu-like symptoms -light-colored stools -nausea, vomiting -pain, swelling, warmth in the leg -right upper belly pain -severe headaches -shortness of breath -sudden numbness or weakness of the face, arm or leg -trouble walking, dizziness, loss of balance or coordination -unusual vaginal bleeding -yellowing of the eyes or skin Side effects that usually do not require medical attention (report to your doctor or health care professional if they continue or are bothersome): -acne -brown spots on the face -change in appetite -change in sexual desire -depressed mood or mood swings -fluid retention and swelling -stomach cramps or bloating -unusually weak or tired -weight gain This list may not describe all possible side effects. Call your doctor for medical advice about side effects. You may report side effects to FDA at 1-800-FDA-1088. Where should I keep my medicine? Keep out of the reach of children. Store at room temperature between 15 and 30 degrees C (59 and 86 degrees F). Throw away any unused medicine after the expiration date. NOTE: This sheet is a summary. It may not cover all possible information. If you have questions about this medicine, talk to your doctor, pharmacist,  or health care provider.    2014, [employer]. (2008-03-25 13:02:54)  

## 2013-09-19 LAB — URINALYSIS W MICROSCOPIC + REFLEX CULTURE
BACTERIA UA: NONE SEEN
Bilirubin Urine: NEGATIVE
Casts: NONE SEEN
Crystals: NONE SEEN
GLUCOSE, UA: NEGATIVE mg/dL
HGB URINE DIPSTICK: NEGATIVE
KETONES UR: NEGATIVE mg/dL
Leukocytes, UA: NEGATIVE
Nitrite: NEGATIVE
PH: 6 (ref 5.0–8.0)
Protein, ur: NEGATIVE mg/dL
Specific Gravity, Urine: 1.03 (ref 1.005–1.030)
Squamous Epithelial / LPF: NONE SEEN
Urobilinogen, UA: 0.2 mg/dL (ref 0.0–1.0)

## 2013-10-28 ENCOUNTER — Encounter: Payer: Self-pay | Admitting: Internal Medicine

## 2013-10-28 ENCOUNTER — Ambulatory Visit (INDEPENDENT_AMBULATORY_CARE_PROVIDER_SITE_OTHER): Payer: BC Managed Care – PPO | Admitting: Internal Medicine

## 2013-10-28 VITALS — BP 102/80 | HR 82 | Temp 98.5°F | Ht 63.0 in | Wt 196.2 lb

## 2013-10-28 DIAGNOSIS — C4401 Basal cell carcinoma of skin of lip: Secondary | ICD-10-CM | POA: Insufficient documentation

## 2013-10-28 DIAGNOSIS — Z0000 Encounter for general adult medical examination without abnormal findings: Secondary | ICD-10-CM

## 2013-10-28 DIAGNOSIS — E669 Obesity, unspecified: Secondary | ICD-10-CM | POA: Insufficient documentation

## 2013-10-28 DIAGNOSIS — E7800 Pure hypercholesterolemia, unspecified: Secondary | ICD-10-CM

## 2013-10-28 NOTE — Patient Instructions (Signed)
Your next office appointment will be determined based upon review of your pending labs . Those instructions will be transmitted to you through My Chart  OR  by mail 

## 2013-10-28 NOTE — Progress Notes (Signed)
Pre visit review using our clinic review tool, if applicable. No additional management support is needed unless otherwise documented below in the visit note. 

## 2013-10-28 NOTE — Progress Notes (Signed)
   Subjective:    Patient ID: Jordan Vang, female    DOB: November 29, 1965, 48 y.o.   MRN: [id number]  HPI  She is here for a physical;acute issues denied.  A modified heart healthy diet is followed; exercise encompasses 30-45 minutes 2-4 times per week as  walking without symptoms.  Family history is ? for premature coronary disease. Advanced cholesterol testing reveals  LDL goal is less than 100 ; ideally < 70. To date no statin.  Low dose ASA not taken   Review of Systems Specifically denied are  chest pain, palpitations, dyspnea,edema, PND or claudication.        Objective:   Physical Exam Gen.: Healthy and well-nourished in appearance. Alert, appropriate and cooperative throughout exam. Appears younger than stated age  Head: Normocephalic without obvious abnormalities Eyes: No corneal or conjunctival inflammation noted. Pupils equal round reactive to light and accommodation. Extraocular motion intact. Ears: External  ear exam reveals no significant lesions or deformities. Canals clear .TMs normal. Hearing is grossly normal bilaterally. Nose: External nasal exam reveals no deformity or inflammation. Nasal mucosa are pink and moist. No lesions or exudates noted.   Mouth: Oral mucosa and oropharynx reveal no lesions or exudates. Teeth in good repair. Neck: No deformities, masses, or tenderness noted. Range of motion & Thyroid normal.. Lungs: Normal respiratory effort; chest expands symmetrically. Lungs are clear to auscultation without rales, wheezes, or increased work of breathing. Heart: Normal rate and rhythm. Normal S1 and S2. No gallop, click, or rub. S4 w/o murmur. Abdomen: Bowel sounds normal; abdomen soft and nontender. No masses, organomegaly or hernias noted. Genitalia:  as per Gyn                                  Musculoskeletal/extremities: No deformity or scoliosis noted of  the thoracic or lumbar spine.  No clubbing, cyanosis, edema, or significant extremity  deformity  noted. Range of motion normal .Tone & strength normal. Hand joints normal  Fingernail / toenail health good. Able to lie down & sit up w/o help. Negative SLR bilaterally Vascular: Carotid, radial artery, dorsalis pedis and  posterior tibial pulses are full and equal. No bruits present. Neurologic: Alert and oriented x3. Deep tendon reflexes symmetrical and normal.  Gait normal.      Skin: Intact without suspicious lesions or rashes.Vascular nevus 5th L toe medially. Lymph: No cervical, axillary lymphadenopathy present. Psych: Mood and affect are normal. Normally interactive                                                                                        Assessment & Plan:  #1 comprehensive physical exam; no acute findings  Plan: see Orders  & Recommendations

## 2013-11-06 ENCOUNTER — Other Ambulatory Visit (INDEPENDENT_AMBULATORY_CARE_PROVIDER_SITE_OTHER): Payer: BC Managed Care – PPO

## 2013-11-06 DIAGNOSIS — Z0000 Encounter for general adult medical examination without abnormal findings: Secondary | ICD-10-CM

## 2013-11-06 LAB — CBC WITH DIFFERENTIAL/PLATELET
BASOS ABS: 0 10*3/uL (ref 0.0–0.1)
Basophils Relative: 0.4 % (ref 0.0–3.0)
EOS ABS: 0.1 10*3/uL (ref 0.0–0.7)
Eosinophils Relative: 1.6 % (ref 0.0–5.0)
HCT: 39.1 % (ref 36.0–46.0)
Hemoglobin: 13.1 g/dL (ref 12.0–15.0)
LYMPHS PCT: 26.8 % (ref 12.0–46.0)
Lymphs Abs: 2.5 10*3/uL (ref 0.7–4.0)
MCHC: 33.5 g/dL (ref 30.0–36.0)
MCV: 82.8 fl (ref 78.0–100.0)
MONOS PCT: 6.2 % (ref 3.0–12.0)
Monocytes Absolute: 0.6 10*3/uL (ref 0.1–1.0)
NEUTROS ABS: 6.1 10*3/uL (ref 1.4–7.7)
Neutrophils Relative %: 65 % (ref 43.0–77.0)
PLATELETS: 308 10*3/uL (ref 150.0–400.0)
RBC: 4.73 Mil/uL (ref 3.87–5.11)
RDW: 13.6 % (ref 11.5–15.5)
WBC: 9.4 10*3/uL (ref 4.0–10.5)

## 2013-11-06 LAB — LIPID PANEL
CHOLESTEROL: 224 mg/dL — AB (ref 0–200)
HDL: 61.2 mg/dL (ref >39.00)
LDL Cholesterol: 119 mg/dL — ABNORMAL HIGH (ref 0–99)
NONHDL: 162.8
Total CHOL/HDL Ratio: 4
Triglycerides: 220 mg/dL — ABNORMAL HIGH (ref 0.0–149.0)
VLDL: 44 mg/dL — ABNORMAL HIGH (ref 0.0–40.0)

## 2013-11-06 LAB — BASIC METABOLIC PANEL
BUN: 16 mg/dL (ref 6–23)
CHLORIDE: 107 meq/L (ref 96–112)
CO2: 24 meq/L (ref 19–32)
Calcium: 9.3 mg/dL (ref 8.4–10.5)
Creatinine, Ser: 0.8 mg/dL (ref 0.4–1.2)
GFR: 77.76 mL/min (ref >60.00)
GLUCOSE: 92 mg/dL (ref 70–99)
POTASSIUM: 4.3 meq/L (ref 3.5–5.1)
Sodium: 137 mEq/L (ref 135–145)

## 2013-11-06 LAB — HEPATIC FUNCTION PANEL
ALBUMIN: 3.5 g/dL (ref 3.5–5.2)
ALK PHOS: 58 U/L (ref 39–117)
ALT: 15 U/L (ref 0–35)
AST: 13 U/L (ref 0–37)
BILIRUBIN TOTAL: 0.4 mg/dL (ref 0.2–1.2)
Bilirubin, Direct: 0.1 mg/dL (ref 0.0–0.3)
Total Protein: 6.9 g/dL (ref 6.0–8.3)

## 2013-11-06 LAB — TSH: TSH: 6.22 u[IU]/mL — ABNORMAL HIGH (ref 0.35–4.50)

## 2013-11-13 ENCOUNTER — Other Ambulatory Visit: Payer: Self-pay | Admitting: Internal Medicine

## 2013-11-13 ENCOUNTER — Telehealth: Payer: Self-pay

## 2013-11-13 DIAGNOSIS — R946 Abnormal results of thyroid function studies: Secondary | ICD-10-CM

## 2013-11-13 NOTE — Telephone Encounter (Signed)
Message copied by Shelly Coss on Fri Nov 13, 2013  8:09 AM ------      Message from: Hendricks Limes      Created: Fri Nov 13, 2013  7:22 AM       For some reason EMR won't let me release labs.Please copy labs & add following comments:      Risk of premature heart attack or stroke increases as LDL or BAD cholesterol rises, especially if there is family history of heart attack in males before 71 or women before 50. Based on your prior advanced testing, your LDL goal is < 100 , ideally < 70.Your present LDL increases long term heart attack or stroke risk 19 %.The best dietary  information on cholesterol is Dr Nunzio Cory book Eat, Drink & Be Healthy.      The most common cause of elevated triglycerides (TG) is the ingestion of sugar from high fructose corn syrup sources added to processed foods & drinks.  Eat a low-fat diet with lots of fruits and vegetables, up to 7-9 servings per day. Consume less than  30  Grams (preferably ZERO) of sugar per day from foods & drinks with High Fructose Corn Syrup (HFCS) sugar as #1,2,3 or # 4 on label.[employer], [employer] do not carry products with HFCS.      TSH (Thyroid Stimulating Hormone) normal range = 0.35- 4.50. Ideal value is 1-3.         A value > 4.50 suggests possible early  HYPOthyroid state from decreased production of thyroid gland hormones. Please repeat TSH with Free T4 to confirm thyroid function status.Hopp       ------

## 2013-11-13 NOTE — Telephone Encounter (Signed)
Mailed along with labs

## 2013-11-19 ENCOUNTER — Other Ambulatory Visit (INDEPENDENT_AMBULATORY_CARE_PROVIDER_SITE_OTHER): Payer: BC Managed Care – PPO

## 2013-11-19 DIAGNOSIS — R946 Abnormal results of thyroid function studies: Secondary | ICD-10-CM

## 2013-11-19 LAB — TSH: TSH: 4.57 u[IU]/mL — ABNORMAL HIGH (ref 0.35–4.50)

## 2013-11-19 LAB — T4, FREE: Free T4: 0.8 ng/dL (ref 0.60–1.60)

## 2013-11-21 ENCOUNTER — Other Ambulatory Visit: Payer: Self-pay | Admitting: Internal Medicine

## 2013-11-21 DIAGNOSIS — R946 Abnormal results of thyroid function studies: Secondary | ICD-10-CM

## 2014-01-27 ENCOUNTER — Other Ambulatory Visit: Payer: Self-pay | Admitting: Gynecology

## 2014-01-27 NOTE — Telephone Encounter (Signed)
Dr.Gottesgen prescribed on OV 09/04/2011 "Patient uses Flonase for seasonally with excellent results."

## 2014-02-22 ENCOUNTER — Encounter: Payer: Self-pay | Admitting: Internal Medicine

## 2014-05-27 ENCOUNTER — Other Ambulatory Visit: Payer: Self-pay | Admitting: Internal Medicine

## 2014-05-27 DIAGNOSIS — Z20828 Contact with and (suspected) exposure to other viral communicable diseases: Secondary | ICD-10-CM

## 2014-05-27 MED ORDER — OSELTAMIVIR PHOSPHATE 75 MG PO CAPS: 75.0000 mg | ORAL_CAPSULE | Freq: Every day | ORAL | Status: DC

## 2014-06-28 ENCOUNTER — Other Ambulatory Visit: Payer: Self-pay

## 2014-06-28 DIAGNOSIS — Z1231 Encounter for screening mammogram for malignant neoplasm of breast: Secondary | ICD-10-CM

## 2014-06-28 DIAGNOSIS — Z803 Family history of malignant neoplasm of breast: Secondary | ICD-10-CM

## 2014-07-06 ENCOUNTER — Ambulatory Visit (INDEPENDENT_AMBULATORY_CARE_PROVIDER_SITE_OTHER): Payer: BLUE CROSS/BLUE SHIELD

## 2014-07-06 ENCOUNTER — Ambulatory Visit (INDEPENDENT_AMBULATORY_CARE_PROVIDER_SITE_OTHER): Payer: BLUE CROSS/BLUE SHIELD | Admitting: Podiatry

## 2014-07-06 ENCOUNTER — Encounter: Payer: Self-pay | Admitting: Podiatry

## 2014-07-06 VITALS — BP 108/67 | HR 81 | Resp 18

## 2014-07-06 DIAGNOSIS — M205X1 Other deformities of toe(s) (acquired), right foot: Secondary | ICD-10-CM | POA: Diagnosis not present

## 2014-07-06 DIAGNOSIS — R52 Pain, unspecified: Secondary | ICD-10-CM

## 2014-07-06 DIAGNOSIS — M779 Enthesopathy, unspecified: Secondary | ICD-10-CM | POA: Diagnosis not present

## 2014-07-06 MED ORDER — TRIAMCINOLONE ACETONIDE 10 MG/ML IJ SUSP
10.0000 mg | Freq: Once | INTRAMUSCULAR | Status: AC
  Administered 2014-07-06: 10 mg

## 2014-07-06 NOTE — Progress Notes (Signed)
Subjective:     Patient ID: Jordan Vang, female   DOB: Aug 28, 1965, 49 y.o.   MRN: [id number]  HPI patient states I have a lot of pain on the outside of my right foot that makes shoe gear at times uncomfortable and there has been fluid in it. It started about 2-3 months ago when I was in [place] other systems reviewed and are negative.      Objective:   Physical Exam  Constitutional: She is oriented to person, place, and time.  Cardiovascular: Intact distal pulses.   Musculoskeletal: Normal range of motion.  Neurological: She is oriented to person, place, and time.  Skin: Skin is warm.  Nursing note and vitals reviewed.  neurovascular status intact with muscle strength adequate and range of motion subtalar and midtarsal joint within normal limits. Patient's noted to have inflammation and fluid around the fifth metatarsal head right and it is mildly to moderately tender when pressed and red and sore especially with certain types of shoes. 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 Today I went ahead and I discussed treatment options and discussed wider shoes and soaks. I did inject the capsule with 3 mg Kenalog 5 mg Xylocaine and advised on possible bursal resection or tailor bunionectomy if symptoms persist

## 2014-07-06 NOTE — Progress Notes (Signed)
   Subjective:    Patient ID: Jordan Vang, female    DOB: Apr 03, 1966, 49 y.o.   MRN: [id number]  HPI LATE January THIS YEAR WE WENT TO [place] AND A PLACE POPPED UP ON MY RIGHT 5TH TOE AND HURTS WHEN I WALK AND IS SORE AND TENDER    Review of Systems     Objective:   Physical Exam        Assessment & Plan:

## 2014-07-14 ENCOUNTER — Ambulatory Visit: Payer: BLUE CROSS/BLUE SHIELD | Admitting: Podiatry

## 2014-07-19 ENCOUNTER — Ambulatory Visit: Payer: BLUE CROSS/BLUE SHIELD | Admitting: Podiatry

## 2014-08-02 ENCOUNTER — Encounter: Payer: Self-pay | Admitting: Internal Medicine

## 2014-08-02 ENCOUNTER — Ambulatory Visit (INDEPENDENT_AMBULATORY_CARE_PROVIDER_SITE_OTHER): Payer: BLUE CROSS/BLUE SHIELD | Admitting: Internal Medicine

## 2014-08-02 VITALS — BP 120/70 | HR 77 | Temp 98.2°F | Ht 63.0 in | Wt 200.2 lb

## 2014-08-02 DIAGNOSIS — R7989 Other specified abnormal findings of blood chemistry: Secondary | ICD-10-CM | POA: Insufficient documentation

## 2014-08-02 DIAGNOSIS — R946 Abnormal results of thyroid function studies: Secondary | ICD-10-CM | POA: Diagnosis not present

## 2014-08-02 DIAGNOSIS — E7800 Pure hypercholesterolemia, unspecified: Secondary | ICD-10-CM

## 2014-08-02 NOTE — Patient Instructions (Signed)
Please follow a Mediaterranean type diet  (many good cook books readily available) or review Dr Nunzio Cory book Eat, Alcorn for best  dietary cholesterol information & options.   Continue excellent cardiovascular exercise program 30-45 minutes 3-4 times per week.   An advanced cholesterol panel (NMR Lipoprofile Lipid Panel)  & TSH after 4 months of nutritional & exercise changes is best way to optimally assess long LDL risk.

## 2014-08-02 NOTE — Progress Notes (Signed)
Pre visit review using our clinic review tool, if applicable. No additional management support is needed unless otherwise documented below in the visit note. 

## 2014-08-02 NOTE — Progress Notes (Signed)
   Subjective:    Patient ID: Jordan Vang, female    DOB: 11/08/1965, 49 y.o.   MRN: [id number]  HPI She is going to [place] in  July for a mission trip.She is inquiring as to  which immunizations & medications are indicated. I have recommended that she go to the [facility] for optimal assessment and recommendations.  In reviewing the chart she does have significant dyslipidemia. Based on an NMR LipoProfile 2007 her LDL goal is less than 100, ideally less than 70. Her most recent lipid panel in July 2015 revealed triglycerides of 220; HDL 61.2; LDL of 119. TSH was therapeutic at that time.TSH was high normal with value of 4.57.  She is on modified heart healthy diet eating red meat once a week. She is walking biking and engaged in yoga 5-6 hours per week without symptoms.  Her father had a stroke at 72. Maternal grandfather had heart disease ; she's unsure of his age @ onset.  Review of Systems  Chest pain, palpitations, tachycardia, exertional dyspnea, paroxysmal nocturnal dyspnea, claudication or edema are absent.      Objective:   Physical Exam  Appears healthy and well-nourished & in no acute distress  No carotid bruits are present.No neck vein distention present at 10 - 15 degrees. Thyroid normal to palpation  Heart rhythm and rate are normal with no gallop or murmur  Chest is clear with no increased work of breathing  There is no evidence of aortic aneurysm or renal artery bruits  Abdomen soft with no organomegaly or masses. No HJR  No clubbing, cyanosis or edema present.  Pedal pulses are intact   No ischemic skin changes are present . Fingernails healthy   Alert and oriented. Strength, tone, DTRs reflexes normal        Assessment & Plan:  #1 pending mission trip; referral made to the travel clinic  #2 dyslipidemia  Plan: See after visit summary

## 2014-08-06 ENCOUNTER — Ambulatory Visit
Admission: RE | Admit: 2014-08-06 | Discharge: 2014-08-06 | Disposition: A | Discharge status: Home / Self Care | Payer: BLUE CROSS/BLUE SHIELD | Source: Ambulatory Visit

## 2014-08-06 DIAGNOSIS — Z1231 Encounter for screening mammogram for malignant neoplasm of breast: Secondary | ICD-10-CM

## 2014-08-06 DIAGNOSIS — Z803 Family history of malignant neoplasm of breast: Secondary | ICD-10-CM

## 2014-08-10 ENCOUNTER — Other Ambulatory Visit: Payer: Self-pay | Admitting: Gynecology

## 2014-08-10 DIAGNOSIS — R928 Other abnormal and inconclusive findings on diagnostic imaging of breast: Secondary | ICD-10-CM

## 2014-08-12 ENCOUNTER — Ambulatory Visit
Admission: RE | Admit: 2014-08-12 | Discharge: 2014-08-12 | Disposition: A | Discharge status: Home / Self Care | Payer: BLUE CROSS/BLUE SHIELD | Source: Ambulatory Visit | Attending: Gynecology | Admitting: Gynecology

## 2014-08-12 DIAGNOSIS — R928 Other abnormal and inconclusive findings on diagnostic imaging of breast: Secondary | ICD-10-CM

## 2014-09-17 ENCOUNTER — Encounter: Payer: Self-pay | Admitting: Podiatry

## 2014-09-17 ENCOUNTER — Ambulatory Visit (INDEPENDENT_AMBULATORY_CARE_PROVIDER_SITE_OTHER): Payer: BLUE CROSS/BLUE SHIELD | Admitting: Podiatry

## 2014-09-17 VITALS — BP 111/65 | HR 69 | Resp 14

## 2014-09-17 DIAGNOSIS — M205X1 Other deformities of toe(s) (acquired), right foot: Secondary | ICD-10-CM

## 2014-09-17 DIAGNOSIS — M779 Enthesopathy, unspecified: Secondary | ICD-10-CM

## 2014-09-17 MED ORDER — TRIAMCINOLONE ACETONIDE 10 MG/ML IJ SUSP
10.0000 mg | Freq: Once | INTRAMUSCULAR | Status: AC
  Administered 2014-09-17: 10 mg

## 2014-09-18 NOTE — Progress Notes (Signed)
Subjective:     Patient ID: Jordan Vang, female   DOB: 03-17-1966, 49 y.o.   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

## 2014-09-24 ENCOUNTER — Ambulatory Visit (INDEPENDENT_AMBULATORY_CARE_PROVIDER_SITE_OTHER): Payer: BLUE CROSS/BLUE SHIELD | Admitting: Gynecology

## 2014-09-24 ENCOUNTER — Encounter: Payer: Self-pay | Admitting: Gynecology

## 2014-09-24 VITALS — BP 132/88 | Ht 63.0 in | Wt 198.0 lb

## 2014-09-24 DIAGNOSIS — Z01419 Encounter for gynecological examination (general) (routine) without abnormal findings: Secondary | ICD-10-CM

## 2014-09-24 MED ORDER — DROSPIRENONE-ETHINYL ESTRADIOL 3-0.03 MG PO TABS: 1.0000 | ORAL_TABLET | Freq: Every day | ORAL | Status: DC

## 2014-09-24 MED ORDER — SERTRALINE HCL 25 MG PO TABS: 25.0000 mg | ORAL_TABLET | Freq: Every day | ORAL | Status: DC

## 2014-09-24 MED ORDER — FLUTICASONE PROPIONATE 50 MCG/ACT NA SUSP: NASAL | Status: DC

## 2014-09-24 NOTE — Patient Instructions (Signed)
Recheck your blood pressure. Follow up if you decide to choose the Mirena IUD Follow up with Dr. Linna Darner in reference to your lab work and thyroid.  You may obtain a copy of any labs that were done today by logging onto MyChart as outlined in the instructions provided with your AVS (after visit summary). The office will not call with normal lab results but certainly if there are any significant abnormalities then we will contact you.   Health Maintenance, Female A healthy lifestyle and preventative care can promote health and wellness.  Maintain regular health, dental, and eye exams.  Eat a healthy diet. Foods like vegetables, fruits, whole grains, low-fat dairy products, and lean protein foods contain the nutrients you need without too many calories. Decrease your intake of foods high in solid fats, added sugars, and salt. Get information about a proper diet from your caregiver, if necessary.  Regular physical exercise is one of the most important things you can do for your health. Most adults should get at least 150 minutes of moderate-intensity exercise (any activity that increases your heart rate and causes you to sweat) each week. In addition, most adults need muscle-strengthening exercises on 2 or more days a week.   Maintain a healthy weight. The body mass index (BMI) is a screening tool to identify possible weight problems. It provides an estimate of body fat based on height and weight. Your caregiver can help determine your BMI, and can help you achieve or maintain a healthy weight. For adults 20 years and older:  A BMI below 18.5 is considered underweight.  A BMI of 18.5 to 24.9 is normal.  A BMI of 25 to 29.9 is considered overweight.  A BMI of 30 and above is considered obese.  Maintain normal blood lipids and cholesterol by exercising and minimizing your intake of saturated fat. Eat a balanced diet with plenty of fruits and vegetables. Blood tests for lipids and cholesterol  should begin at age 52 and be repeated every 5 years. If your lipid or cholesterol levels are high, you are over 50, or you are a high risk for heart disease, you may need your cholesterol levels checked more frequently.Ongoing high lipid and cholesterol levels should be treated with medicines if diet and exercise are not effective.  If you smoke, find out from your caregiver how to quit. If you do not use tobacco, do not start.  Lung cancer screening is recommended for adults aged 36 80 years who are at high risk for developing lung cancer because of a history of smoking. Yearly low-dose computed tomography (CT) is recommended for people who have at least a 30-pack-year history of smoking and are a current smoker or have quit within the past 15 years. A pack year of smoking is smoking an average of 1 pack of cigarettes a day for 1 year (for example: 1 pack a day for 30 years or 2 packs a day for 15 years). Yearly screening should continue until the smoker has stopped smoking for at least 15 years. Yearly screening should also be stopped for people who develop a health problem that would prevent them from having lung cancer treatment.  If you are pregnant, do not drink alcohol. If you are breastfeeding, be very cautious about drinking alcohol. If you are not pregnant and choose to drink alcohol, do not exceed 1 drink per day. One drink is considered to be 12 ounces (355 mL) of beer, 5 ounces (148 mL) of wine, or 1.5  ounces (44 mL) of liquor.  Avoid use of street drugs. Do not share needles with anyone. Ask for help if you need support or instructions about stopping the use of drugs.  High blood pressure causes heart disease and increases the risk of stroke. Blood pressure should be checked at least every 1 to 2 years. Ongoing high blood pressure should be treated with medicines, if weight loss and exercise are not effective.  If you are 96 to 50 years old, ask your caregiver if you should take aspirin  to prevent strokes.  Diabetes screening involves taking a blood sample to check your fasting blood sugar level. This should be done once every 3 years, after age 29, if you are within normal weight and without risk factors for diabetes. Testing should be considered at a younger age or be carried out more frequently if you are overweight and have at least 1 risk factor for diabetes.  Breast cancer screening is essential preventative care for women. You should practice "breast self-awareness." This means understanding the normal appearance and feel of your breasts and may include breast self-examination. Any changes detected, no matter how small, should be reported to a caregiver. Women in their 71s and 30s should have a clinical breast exam (CBE) by a caregiver as part of a regular health exam every 1 to 3 years. After age 49, women should have a CBE every year. Starting at age 32, women should consider having a mammogram (breast X-ray) every year. Women who have a family history of breast cancer should talk to their caregiver about genetic screening. Women at a high risk of breast cancer should talk to their caregiver about having an MRI and a mammogram every year.  Breast cancer gene (BRCA)-related cancer risk assessment is recommended for women who have family members with BRCA-related cancers. BRCA-related cancers include breast, ovarian, tubal, and peritoneal cancers. Having family members with these cancers may be associated with an increased risk for harmful changes (mutations) in the breast cancer genes BRCA1 and BRCA2. Results of the assessment will determine the need for genetic counseling and BRCA1 and BRCA2 testing.  The Pap test is a screening test for cervical cancer. Women should have a Pap test starting at age 33. Between ages 43 and 51, Pap tests should be repeated every 2 years. Beginning at age 36, you should have a Pap test every 3 years as long as the past 3 Pap tests have been normal. If  you had a hysterectomy for a problem that was not cancer or a condition that could lead to cancer, then you no longer need Pap tests. If you are between ages 75 and 34, and you have had normal Pap tests going back 10 years, you no longer need Pap tests. If you have had past treatment for cervical cancer or a condition that could lead to cancer, you need Pap tests and screening for cancer for at least 20 years after your treatment. If Pap tests have been discontinued, risk factors (such as a new sexual partner) need to be reassessed to determine if screening should be resumed. Some women have medical problems that increase the chance of getting cervical cancer. In these cases, your caregiver may recommend more frequent screening and Pap tests.  The human papillomavirus (HPV) test is an additional test that may be used for cervical cancer screening. The HPV test looks for the virus that can cause the cell changes on the cervix. The cells collected during the Pap test  can be tested for HPV. The HPV test could be used to screen women aged 34 years and older, and should be used in women of any age who have unclear Pap test results. After the age of 74, women should have HPV testing at the same frequency as a Pap test.  Colorectal cancer can be detected and often prevented. Most routine colorectal cancer screening begins at the age of 2 and continues through age 85. However, your caregiver may recommend screening at an earlier age if you have risk factors for colon cancer. On a yearly basis, your caregiver may provide home test kits to check for hidden blood in the stool. Use of a small camera at the end of a tube, to directly examine the colon (sigmoidoscopy or colonoscopy), can detect the earliest forms of colorectal cancer. Talk to your caregiver about this at age 8, when routine screening begins. Direct examination of the colon should be repeated every 5 to 10 years through age 28, unless early forms of  pre-cancerous polyps or small growths are found.  Hepatitis C blood testing is recommended for all people born from [birth year] through 1966 and any individual with known risks for hepatitis C.  Practice safe sex. Use condoms and avoid high-risk sexual practices to reduce the spread of sexually transmitted infections (STIs). Sexually active women aged 31 and younger should be checked for Chlamydia, which is a common sexually transmitted infection. Older women with new or multiple partners should also be tested for Chlamydia. Testing for other STIs is recommended if you are sexually active and at increased risk.  Osteoporosis is a disease in which the bones lose minerals and strength with aging. This can result in serious bone fractures. The risk of osteoporosis can be identified using a bone density scan. Women ages 59 and over and women at risk for fractures or osteoporosis should discuss screening with their caregivers. Ask your caregiver whether you should be taking a calcium supplement or vitamin D to reduce the rate of osteoporosis.  Menopause can be associated with physical symptoms and risks. Hormone replacement therapy is available to decrease symptoms and risks. You should talk to your caregiver about whether hormone replacement therapy is right for you.  Use sunscreen. Apply sunscreen liberally and repeatedly throughout the day. You should seek shade when your shadow is shorter than you. Protect yourself by wearing long sleeves, pants, a wide-brimmed hat, and sunglasses year round, whenever you are outdoors.  Notify your caregiver of new moles or changes in moles, especially if there is a change in shape or color. Also notify your caregiver if a mole is larger than the size of a pencil eraser.  Stay current with your immunizations. Document Released: 10/23/2010 Document Revised: 08/04/2012 Document Reviewed: 10/23/2010 [facility] Patient Information 2014 [place].

## 2014-09-24 NOTE — Progress Notes (Signed)
Jordan Vang Sep 19, 1965 [id number]        49 y.o.  G1P1001 for annual exam.  Several issues noted below.  Past medical history,surgical history, problem list, medications, allergies, family history and social history were all reviewed and documented as reviewed in the EPIC chart.  ROS:  Performed with pertinent positives and negatives included in the history, assessment and plan.   Additional significant findings :  none   Exam: Jordan Vang Vitals:   09/24/14 0925  BP: 132/88  Height: 5\' 3"  (1.6 m)  Weight: 198 lb (89.812 kg)   General appearance:  Normal affect, orientation and appearance. Skin: Grossly normal HEENT: Without gross lesions.  No cervical or supraclavicular adenopathy. Thyroid normal.  Lungs:  Clear without wheezing, rales or rhonchi Cardiac: RR, without RMG Abdominal:  Soft, nontender, without masses, guarding, rebound, organomegaly or hernia Breasts:  Examined lying and sitting without masses, retractions, discharge or axillary adenopathy. Pelvic:  Ext/BUS/vagina normal  Cervix normal  Uterus anteverted, normal size, shape and contour, midline and mobile nontender   Adnexa  Without masses or tenderness    Anus and perineum  Normal   Rectovaginal  Normal sphincter tone without palpated masses or tenderness.    Assessment/Plan:  49 y.o. [name] for annual exam with menses every 3 months on Yaz BCP equivalent.   1. Birth control.  Patient using Yaz equivalent with every 3 month withdrawal. I again reviewed the whole issue of birth control pills with increasing age and increasing risk of thrombosis such as stroke heart attack DVT. Also reviewed increased risk in drospirenone pills. Alternatives to include switching to a different pill or using a different form of contraception such as IUD was reviewed. I discussed renal IUD in detail and placement procedure. Patient wants to think of this but at this point wants to continue on the as clearly  understanding the issues and risks. Refill 1 year provided. 2. Pap smear/HPV negative 2015. No history of significant abnormal Pap smears previously. Plan repeat Pap smear at 3-5 year interval per current screening guidelines. 3. Mammography 07/2014. Continue with annual mammography. 4. PMDD. Patient using Zoloft 25 mg daily with great control of her mood swings. Wants to continue. Refill 1 year provided. 5. Flonase refill provided at her request. Has used chronically with good results. 6. Health maintenance. Patient actively sees Dr. Linna Darner. Is due to go back and have her cholesterol rechecked and her thyroid. She has had a mildly elevated TSH of the last 2 years. She is going to discuss this with him.  Blood pressure mildly elevated today at 132/88. Reports never having an issue before. A bastard have this rechecked in the non-exam situation. If it remains elevated she'll follow up with Dr. Linna Darner reference to this. Follow up for IUD if she decides to pursue this otherwise annually.     Anastasio Auerbach MD, 9:50 AM 09/24/2014

## 2014-10-18 ENCOUNTER — Telehealth: Payer: Self-pay | Admitting: Internal Medicine

## 2014-10-18 NOTE — Telephone Encounter (Signed)
Patient is traveling to [place] 10/30/2014. The travel nurse recommended she get her Hep B vaccine. She's never had one before. She has two questions:  1- can she get it for this reason??  2- since she will only have time for the first portion before 10/30/2014, will she still have protection without the second dose?   Please call the patient to let her know.

## 2014-10-18 NOTE — Telephone Encounter (Signed)
Please advise 

## 2014-10-18 NOTE — Telephone Encounter (Signed)
The [facility] is the best option as it can provide up to the minute assessments of any communicable disease risk in that area.  I did that before going to [place] and [place]. [employer]

## 2015-02-10 ENCOUNTER — Ambulatory Visit (INDEPENDENT_AMBULATORY_CARE_PROVIDER_SITE_OTHER): Payer: BLUE CROSS/BLUE SHIELD

## 2015-02-10 ENCOUNTER — Telehealth: Payer: Self-pay | Admitting: Internal Medicine

## 2015-02-10 DIAGNOSIS — Z23 Encounter for immunization: Secondary | ICD-10-CM | POA: Diagnosis not present

## 2015-03-31 ENCOUNTER — Telehealth: Payer: Self-pay | Admitting: Internal Medicine

## 2015-03-31 NOTE — Telephone Encounter (Signed)
LVM informing pt that labs were entered back in April for TSH and Cholesterol

## 2015-03-31 NOTE — Telephone Encounter (Signed)
Is requesting lab orders to be entered to recheck thyroid level and cholesterol.

## 2015-04-08 ENCOUNTER — Other Ambulatory Visit (INDEPENDENT_AMBULATORY_CARE_PROVIDER_SITE_OTHER): Payer: BLUE CROSS/BLUE SHIELD

## 2015-04-08 ENCOUNTER — Other Ambulatory Visit: Payer: Self-pay | Admitting: Internal Medicine

## 2015-04-08 ENCOUNTER — Telehealth: Payer: Self-pay | Admitting: Emergency Medicine

## 2015-04-08 DIAGNOSIS — R7989 Other specified abnormal findings of blood chemistry: Secondary | ICD-10-CM

## 2015-04-08 DIAGNOSIS — E7800 Pure hypercholesterolemia, unspecified: Secondary | ICD-10-CM | POA: Diagnosis not present

## 2015-04-08 DIAGNOSIS — R946 Abnormal results of thyroid function studies: Secondary | ICD-10-CM | POA: Diagnosis not present

## 2015-04-08 LAB — BASIC METABOLIC PANEL
BUN: 13 mg/dL (ref 6–23)
CALCIUM: 9.3 mg/dL (ref 8.4–10.5)
CO2: 26 meq/L (ref 19–32)
Chloride: 105 mEq/L (ref 96–112)
Creatinine, Ser: 0.84 mg/dL (ref 0.40–1.20)
GFR: 76.25 mL/min (ref >60.00)
GLUCOSE: 85 mg/dL (ref 70–99)
POTASSIUM: 4.2 meq/L (ref 3.5–5.1)
SODIUM: 138 meq/L (ref 135–145)

## 2015-04-08 LAB — LIPID PANEL
CHOL/HDL RATIO: 4
Cholesterol: 211 mg/dL — ABNORMAL HIGH (ref 0–200)
HDL: 56.5 mg/dL (ref >39.00)
NONHDL: 154.67
Triglycerides: 209 mg/dL — ABNORMAL HIGH (ref 0.0–149.0)
VLDL: 41.8 mg/dL — AB (ref 0.0–40.0)

## 2015-04-08 LAB — LDL CHOLESTEROL, DIRECT: LDL DIRECT: 120 mg/dL

## 2015-04-08 LAB — HEPATIC FUNCTION PANEL
ALT: 10 U/L (ref 0–35)
AST: 11 U/L (ref 0–37)
Albumin: 3.6 g/dL (ref 3.5–5.2)
Alkaline Phosphatase: 55 U/L (ref 39–117)
BILIRUBIN TOTAL: 0.3 mg/dL (ref 0.2–1.2)
Bilirubin, Direct: 0 mg/dL (ref 0.0–0.3)
Total Protein: 6.6 g/dL (ref 6.0–8.3)

## 2015-04-08 LAB — TSH: TSH: 3.79 u[IU]/mL (ref 0.35–4.50)

## 2015-04-08 NOTE — Telephone Encounter (Signed)
Pt came into lab, labs were expired, labs have been reordered.

## 2015-04-08 NOTE — Telephone Encounter (Signed)
Per Dr Linus Orn, Lipid Panel added due to insurance purposes.

## 2015-04-12 LAB — NMR LIPOPROFILE WITH LIPIDS
CHOLESTEROL, TOTAL: 222 mg/dL — AB (ref 100–199)
HDL Particle Number: 32.5 umol/L (ref >=30.5)
HDL Size: 9.7 nm (ref >=9.2)
HDL-C: 62 mg/dL (ref >39)
LARGE HDL: 14.8 umol/L (ref >=4.8)
LARGE VLDL-P: 3.6 nmol/L — AB (ref <=2.7)
LDL (calc): 117 mg/dL — ABNORMAL HIGH (ref 0–99)
LDL PARTICLE NUMBER: 1933 nmol/L — AB (ref <1000)
LDL SIZE: 20.8 nm (ref >=20.8)
LP-IR Score: 41 (ref <=45)
SMALL LDL PARTICLE NUMBER: 630 nmol/L — AB (ref <=527)
TRIGLYCERIDES: 217 mg/dL — AB (ref 0–149)
VLDL Size: 52.7 nm — ABNORMAL HIGH (ref <=46.6)

## 2015-07-11 ENCOUNTER — Other Ambulatory Visit: Payer: Self-pay

## 2015-07-11 DIAGNOSIS — Z1231 Encounter for screening mammogram for malignant neoplasm of breast: Secondary | ICD-10-CM

## 2015-08-11 ENCOUNTER — Ambulatory Visit
Admission: RE | Admit: 2015-08-11 | Discharge: 2015-08-11 | Disposition: A | Discharge status: Home / Self Care | Payer: BLUE CROSS/BLUE SHIELD | Source: Ambulatory Visit

## 2015-08-11 DIAGNOSIS — Z1231 Encounter for screening mammogram for malignant neoplasm of breast: Secondary | ICD-10-CM

## 2015-08-15 ENCOUNTER — Ambulatory Visit (INDEPENDENT_AMBULATORY_CARE_PROVIDER_SITE_OTHER): Payer: BLUE CROSS/BLUE SHIELD | Admitting: Internal Medicine

## 2015-08-15 ENCOUNTER — Other Ambulatory Visit (INDEPENDENT_AMBULATORY_CARE_PROVIDER_SITE_OTHER): Payer: BLUE CROSS/BLUE SHIELD

## 2015-08-15 ENCOUNTER — Encounter: Payer: Self-pay | Admitting: Internal Medicine

## 2015-08-15 VITALS — BP 118/84 | HR 73 | Temp 98.5°F | Resp 18 | Ht 63.0 in | Wt 202.0 lb

## 2015-08-15 DIAGNOSIS — R5383 Other fatigue: Secondary | ICD-10-CM

## 2015-08-15 LAB — CBC
HCT: 37.9 % (ref 36.0–46.0)
Hemoglobin: 12.8 g/dL (ref 12.0–15.0)
MCHC: 33.8 g/dL (ref 30.0–36.0)
MCV: 80.8 fl (ref 78.0–100.0)
PLATELETS: 302 10*3/uL (ref 150.0–400.0)
RBC: 4.69 Mil/uL (ref 3.87–5.11)
RDW: 13.7 % (ref 11.5–15.5)
WBC: 10.2 10*3/uL (ref 4.0–10.5)

## 2015-08-15 LAB — T4, FREE: FREE T4: 0.7 ng/dL (ref 0.60–1.60)

## 2015-08-15 LAB — VITAMIN B12: VITAMIN B 12: 207 pg/mL — AB (ref 211–911)

## 2015-08-15 LAB — FERRITIN: Ferritin: 25.5 ng/mL (ref 10.0–291.0)

## 2015-08-15 LAB — FOLATE: FOLATE: 18.4 ng/mL (ref >5.9)

## 2015-08-15 LAB — TSH: TSH: 4 u[IU]/mL (ref 0.35–4.50)

## 2015-08-15 NOTE — Patient Instructions (Signed)
We will check the blood work today and get you in for the colonoscopy.  Iron-Rich Diet Iron is a mineral that helps your body to produce hemoglobin. Hemoglobin is a protein in your red blood cells that carries oxygen to your body's tissues. Eating too little iron may cause you to feel weak and tired, and it can increase your risk for infection. Eating enough iron is necessary for your body's metabolism, muscle function, and nervous system. Iron is naturally found in many foods. It can also be added to foods or fortified in foods. There are two types of dietary iron:  Heme iron. Heme iron is absorbed by the body more easily than nonheme iron. Heme iron is found in meat, poultry, and fish.  Nonheme iron. Nonheme iron is found in dietary supplements, iron-fortified grains, beans, and vegetables. You may need to follow an iron-rich diet if:  You have been diagnosed with iron deficiency or iron-deficiency anemia.  You have a condition that prevents you from absorbing dietary iron, such as:  Infection in your intestines.  Celiac disease. This involves long-lasting (chronic) inflammation of your intestines.  You do not eat enough iron.  You eat a diet that is high in foods that impair iron absorption.  You have lost a lot of blood.  You have heavy bleeding during your menstrual cycle.  You are pregnant. WHAT IS MY PLAN? Your health care provider may help you to determine how much iron you need per day based on your condition. Generally, when a person consumes sufficient amounts of iron in the diet, the following iron needs are met:  Men.  29-52 years old: 11 mg per day.  1-67 years old: 8 mg per day.  Women.   33-36 years old: 15 mg per day.  21-86 years old: 18 mg per day.  Over 54 years old: 8 mg per day.  Pregnant women: 27 mg per day.  Breastfeeding women: 9 mg per day. WHAT DO I NEED TO KNOW ABOUT AN IRON-RICH DIET?  Eat fresh fruits and vegetables that are high in  vitamin C along with foods that are high in iron. This will help increase the amount of iron that your body absorbs from food, especially with foods containing nonheme iron. Foods that are high in vitamin C include oranges, peppers, tomatoes, and mango.  Take iron supplements only as directed by your health care provider. Overdose of iron can be life-threatening. If you were prescribed iron supplements, take them with orange juice or a vitamin C supplement.  Cook foods in pots and pans that are made from iron.   Eat nonheme iron-containing foods alongside foods that are high in heme iron. This helps to improve your iron absorption.   Certain foods and drinks contain compounds that impair iron absorption. Avoid eating these foods in the same meal as iron-rich foods or with iron supplements. These include:  Coffee, black tea, and red wine.  Milk, dairy products, and foods that are high in calcium.  Beans, soybeans, and peas.  Whole grains.  When eating foods that contain both nonheme iron and compounds that impair iron absorption, follow these tips to absorb iron better.   Soak beans overnight before cooking.  Soak whole grains overnight and drain them before using.  Ferment flours before baking, such as using yeast in bread dough. WHAT FOODS CAN I EAT? Grains Iron-fortified breakfast cereal. Iron-fortified whole-wheat bread. Enriched rice. Sprouted grains. Vegetables Spinach. Potatoes with skin. Green peas. Broccoli. Red and green  bell peppers. Fermented vegetables. Fruits Prunes. Raisins. Oranges. Strawberries. Mango. Grapefruit. Meats and Other Protein Sources Beef liver. Oysters. Beef. Shrimp. [place]. Chicken. [place]mer. Sardines. Chickpeas. Nuts. Tofu. Beverages Tomato juice. Fresh orange juice. Prune juice. Hibiscus tea. Fortified instant breakfast shakes. Condiments Tahini. Fermented soy sauce. Sweets and Desserts Black-strap molasses.  Other Wheat germ. The  items listed above may not be a complete list of recommended foods or beverages. Contact your dietitian for more options. WHAT FOODS ARE NOT RECOMMENDED? Grains Whole grains. Bran cereal. Bran flour. Oats. Vegetables Artichokes. Brussels sprouts. Kale. Fruits Blueberries. Raspberries. Strawberries. Figs. Meats and Other Protein Sources Soybeans. Products made from soy protein. Dairy Milk. Cream. Cheese. Yogurt. Cottage cheese. Beverages Coffee. Black tea. Red wine. Sweets and Desserts Cocoa. Chocolate. Ice cream. Other Basil. Oregano. Parsley. The items listed above may not be a complete list of foods and beverages to avoid. Contact your dietitian for more information.   This information is not intended to replace advice given to you by your health care provider. Make sure you discuss any questions you have with your health care provider.   Document Released: 11/21/2004 Document Revised: 04/30/2014 Document Reviewed: 11/04/2013 Elsevier Interactive Patient Education [employer].

## 2015-08-15 NOTE — Progress Notes (Signed)
Pre visit review using our clinic review tool, if applicable. No additional management support is needed unless otherwise documented below in the visit note. 

## 2015-08-16 DIAGNOSIS — R5383 Other fatigue: Secondary | ICD-10-CM | POA: Insufficient documentation

## 2015-08-16 NOTE — Assessment & Plan Note (Signed)
Checking thyroid levels, blood counts and iron and B12 levels. Referral to GI for colonoscopy as she is due. If she does have iron deficiency this would be another reason to get colonoscopy.

## 2015-08-16 NOTE — Progress Notes (Signed)
   Subjective:    Patient ID: Jordan Vang, female    DOB: Jan 15, 1966, 50 y.o.   MRN: [id number]  HPI The patient is a 50 YO female coming in for some fatigue and exhaustion. She has switched vitamins in the last several months and hers now does not have iron in it. She is taking birth control for 3 packages then takes 1 week off and has light bleeding. No other source of blood loss. No blood in stool or black stools. Has also had some abnormal thyroid labs in the past but never needed treatment. She does have family history of thyroid problems. No other new complaints. Does also have some PMDD which is under good control and no signs of depression or anxiety that she admits to.   Review of Systems  Constitutional: Positive for activity change and fatigue. Negative for fever, chills, appetite change and unexpected weight change.  HENT: Negative.   Eyes: Negative.   Respiratory: Negative.   Cardiovascular: Negative.   Gastrointestinal: Negative.   Musculoskeletal: Negative.   Skin: Negative.   Neurological: Negative.   Psychiatric/Behavioral: Negative.       Objective:   Physical Exam  Constitutional: She is oriented to person, place, and time. She appears well-developed and well-nourished.  HENT:  Head: Normocephalic and atraumatic.  Eyes: EOM are normal.  Neck: Normal range of motion.  Cardiovascular: Normal rate and regular rhythm.   Pulmonary/Chest: Effort normal and breath sounds normal. No respiratory distress. She has no wheezes. She has no rales.  Abdominal: Soft. Bowel sounds are normal. She exhibits no distension. There is no tenderness. There is no rebound.  Musculoskeletal: She exhibits no edema.  Neurological: She is alert and oriented to person, place, and time. Coordination normal.  Skin: Skin is warm and dry.  Psychiatric: She has a normal mood and affect.   Filed Vitals:   08/15/15 1503  BP: 118/84  Pulse: 73  Temp: 98.5 F (36.9 C)  TempSrc: Oral  Resp: 18    Height: 5\' 3"  (1.6 m)  Weight: 202 lb (91.627 kg)  SpO2: 98%      Assessment & Plan:

## 2015-08-18 ENCOUNTER — Telehealth: Payer: Self-pay | Admitting: Internal Medicine

## 2015-08-18 NOTE — Telephone Encounter (Signed)
Please call patient back with lab results

## 2015-08-19 ENCOUNTER — Other Ambulatory Visit: Payer: Self-pay | Admitting: Internal Medicine

## 2015-08-19 ENCOUNTER — Telehealth: Payer: Self-pay

## 2015-08-19 MED ORDER — VITAMIN B-12 1000 MCG PO TABS: 1000.0000 ug | ORAL_TABLET | Freq: Every day | ORAL | Status: DC

## 2015-08-19 NOTE — Telephone Encounter (Signed)
[facility] called and said that Jordan Vang came in and said a Vit D RX should of been sent in. They do not see it. Could you please send it again. Thank you.

## 2015-08-19 NOTE — Telephone Encounter (Signed)
Patient saw her lab results online and will start taking B12 daily.

## 2015-08-22 ENCOUNTER — Encounter: Payer: Self-pay | Admitting: Internal Medicine

## 2015-08-22 NOTE — Telephone Encounter (Signed)
Called patient, unable to reach left message to give us a call back. 

## 2015-08-23 ENCOUNTER — Encounter: Payer: Self-pay | Admitting: Internal Medicine

## 2015-09-12 ENCOUNTER — Other Ambulatory Visit: Payer: Self-pay | Admitting: Gynecology

## 2015-09-12 NOTE — Telephone Encounter (Signed)
Annual scheduled on 10/13/15

## 2015-09-29 ENCOUNTER — Ambulatory Visit (AMBULATORY_SURGERY_CENTER): Payer: Self-pay

## 2015-09-29 VITALS — Ht 63.0 in | Wt 200.0 lb

## 2015-09-29 DIAGNOSIS — Z1211 Encounter for screening for malignant neoplasm of colon: Secondary | ICD-10-CM

## 2015-09-29 MED ORDER — NA SULFATE-K SULFATE-MG SULF 17.5-3.13-1.6 GM/177ML PO SOLN: 1.0000 | Freq: Once | ORAL | Status: DC

## 2015-09-29 NOTE — Progress Notes (Signed)
No egg or soy allergy.  No previous complications from anesthesia. No home O2. No diet meds. 

## 2015-10-07 ENCOUNTER — Encounter: Payer: Self-pay | Admitting: Internal Medicine

## 2015-10-13 ENCOUNTER — Ambulatory Visit (INDEPENDENT_AMBULATORY_CARE_PROVIDER_SITE_OTHER): Payer: BLUE CROSS/BLUE SHIELD | Admitting: Gynecology

## 2015-10-13 ENCOUNTER — Encounter: Payer: Self-pay | Admitting: Gynecology

## 2015-10-13 VITALS — BP 118/76 | Ht 63.0 in | Wt 200.0 lb

## 2015-10-13 DIAGNOSIS — Z3009 Encounter for other general counseling and advice on contraception: Secondary | ICD-10-CM | POA: Diagnosis not present

## 2015-10-13 DIAGNOSIS — Z01419 Encounter for gynecological examination (general) (routine) without abnormal findings: Secondary | ICD-10-CM

## 2015-10-13 DIAGNOSIS — N943 Premenstrual tension syndrome: Secondary | ICD-10-CM

## 2015-10-13 MED ORDER — DROSPIRENONE-ETHINYL ESTRADIOL 3-0.03 MG PO TABS: ORAL_TABLET | ORAL | Status: DC

## 2015-10-13 MED ORDER — SERTRALINE HCL 25 MG PO TABS: 25.0000 mg | ORAL_TABLET | Freq: Every day | ORAL | Status: DC

## 2015-10-13 NOTE — Patient Instructions (Signed)
Return at the end of your pill free week to check the blood level to test for early menopause.  You may obtain a copy of any labs that were done today by logging onto MyChart as outlined in the instructions provided with your AVS (after visit summary). The office will not call with normal lab results but certainly if there are any significant abnormalities then we will contact you.   Health Maintenance Adopting a healthy lifestyle and getting preventive care can go a long way to promote health and wellness. Talk with your health care provider about what schedule of regular examinations is right for you. This is a good chance for you to check in with your provider about disease prevention and staying healthy. In between checkups, there are plenty of things you can do on your own. Experts have done a lot of research about which lifestyle changes and preventive measures are most likely to keep you healthy. Ask your health care provider for more information. WEIGHT AND DIET  Eat a healthy diet  Be sure to include plenty of vegetables, fruits, low-fat dairy products, and lean protein.  Do not eat a lot of foods high in solid fats, added sugars, or salt.  Get regular exercise. This is one of the most important things you can do for your health.  Most adults should exercise for at least 150 minutes each week. The exercise should increase your heart rate and make you sweat (moderate-intensity exercise).  Most adults should also do strengthening exercises at least twice a week. This is in addition to the moderate-intensity exercise.  Maintain a healthy weight  Body mass index (BMI) is a measurement that can be used to identify possible weight problems. It estimates body fat based on height and weight. Your health care provider can help determine your BMI and help you achieve or maintain a healthy weight.  For females 78 years of age and older:   A BMI below 18.5 is considered underweight.  A BMI  of 18.5 to 24.9 is normal.  A BMI of 25 to 29.9 is considered overweight.  A BMI of 30 and above is considered obese.  Watch levels of cholesterol and blood lipids  You should start having your blood tested for lipids and cholesterol at 51 years of age, then have this test every 5 years.  You may need to have your cholesterol levels checked more often if:  Your lipid or cholesterol levels are high.  You are older than 51 years of age.  You are at high risk for heart disease.  CANCER SCREENING   Lung Cancer  Lung cancer screening is recommended for adults 3-59 years old who are at high risk for lung cancer because of a history of smoking.  A yearly low-dose CT scan of the lungs is recommended for people who:  Currently smoke.  Have quit within the past 15 years.  Have at least a 30-pack-year history of smoking. A pack year is smoking an average of one pack of cigarettes a day for 1 year.  Yearly screening should continue until it has been 15 years since you quit.  Yearly screening should stop if you develop a health problem that would prevent you from having lung cancer treatment.  Breast Cancer  Practice breast self-awareness. This means understanding how your breasts normally appear and feel.  It also means doing regular breast self-exams. Let your health care provider know about any changes, no matter how small.  If you are  in your 40s or 30s, you should have a clinical breast exam (CBE) by a health care provider every 1-3 years as part of a regular health exam.  If you are 59 or older, have a CBE every year. Also consider having a breast X-ray (mammogram) every year.  If you have a family history of breast cancer, talk to your health care provider about genetic screening.  If you are at high risk for breast cancer, talk to your health care provider about having an MRI and a mammogram every year.  Breast cancer gene (BRCA) assessment is recommended for women who  have family members with BRCA-related cancers. BRCA-related cancers include:  Breast.  Ovarian.  Tubal.  Peritoneal cancers.  Results of the assessment will determine the need for genetic counseling and BRCA1 and BRCA2 testing. Cervical Cancer Routine pelvic examinations to screen for cervical cancer are no longer recommended for nonpregnant women who are considered low risk for cancer of the pelvic organs (ovaries, uterus, and vagina) and who do not have symptoms. A pelvic examination may be necessary if you have symptoms including those associated with pelvic infections. Ask your health care provider if a screening pelvic exam is right for you.   The Pap test is the screening test for cervical cancer for women who are considered at risk.  If you had a hysterectomy for a problem that was not cancer or a condition that could lead to cancer, then you no longer need Pap tests.  If you are older than 65 years, and you have had normal Pap tests for the past 10 years, you no longer need to have Pap tests.  If you have had past treatment for cervical cancer or a condition that could lead to cancer, you need Pap tests and screening for cancer for at least 20 years after your treatment.  If you no longer get a Pap test, assess your risk factors if they change (such as having a new sexual partner). This can affect whether you should start being screened again.  Some women have medical problems that increase their chance of getting cervical cancer. If this is the case for you, your health care provider may recommend more frequent screening and Pap tests.  The human papillomavirus (HPV) test is another test that may be used for cervical cancer screening. The HPV test looks for the virus that can cause cell changes in the cervix. The cells collected during the Pap test can be tested for HPV.  The HPV test can be used to screen women 12 years of age and older. Getting tested for HPV can extend the  interval between normal Pap tests from three to five years.  An HPV test also should be used to screen women of any age who have unclear Pap test results.  After 51 years of age, women should have HPV testing as often as Pap tests.  Colorectal Cancer  This type of cancer can be detected and often prevented.  Routine colorectal cancer screening usually begins at 51 years of age and continues through 51 years of age.  Your health care provider may recommend screening at an earlier age if you have risk factors for colon cancer.  Your health care provider may also recommend using home test kits to check for hidden blood in the stool.  A small camera at the end of a tube can be used to examine your colon directly (sigmoidoscopy or colonoscopy). This is done to check for the earliest forms  of colorectal cancer.  Routine screening usually begins at age 37.  Direct examination of the colon should be repeated every 5-10 years through 51 years of age. However, you may need to be screened more often if early forms of precancerous polyps or small growths are found. Skin Cancer  Check your skin from head to toe regularly.  Tell your health care provider about any new moles or changes in moles, especially if there is a change in a mole's shape or color.  Also tell your health care provider if you have a mole that is larger than the size of a pencil eraser.  Always use sunscreen. Apply sunscreen liberally and repeatedly throughout the day.  Protect yourself by wearing long sleeves, pants, a wide-brimmed hat, and sunglasses whenever you are outside. HEART DISEASE, DIABETES, AND HIGH BLOOD PRESSURE   Have your blood pressure checked at least every 1-2 years. High blood pressure causes heart disease and increases the risk of stroke.  If you are between 80 years and 2 years old, ask your health care provider if you should take aspirin to prevent strokes.  Have regular diabetes screenings. This  involves taking a blood sample to check your fasting blood sugar level.  If you are at a normal weight and have a low risk for diabetes, have this test once every three years after 51 years of age.  If you are overweight and have a high risk for diabetes, consider being tested at a younger age or more often. PREVENTING INFECTION  Hepatitis B  If you have a higher risk for hepatitis B, you should be screened for this virus. You are considered at high risk for hepatitis B if:  You were born in a country where hepatitis B is common. Ask your health care provider which countries are considered high risk.  Your parents were born in a high-risk country, and you have not been immunized against hepatitis B (hepatitis B vaccine).  You have HIV or AIDS.  You use needles to inject street drugs.  You live with someone who has hepatitis B.  You have had sex with someone who has hepatitis B.  You get hemodialysis treatment.  You take certain medicines for conditions, including cancer, organ transplantation, and autoimmune conditions. Hepatitis C  Blood testing is recommended for:  Everyone born from [birth year] through 1966.  Anyone with known risk factors for hepatitis C. Sexually transmitted infections (STIs)  You should be screened for sexually transmitted infections (STIs) including gonorrhea and chlamydia if:  You are sexually active and are younger than 51 years of age.  You are older than 51 years of age and your health care provider tells you that you are at risk for this type of infection.  Your sexual activity has changed since you were last screened and you are at an increased risk for chlamydia or gonorrhea. Ask your health care provider if you are at risk.  If you do not have HIV, but are at risk, it may be recommended that you take a prescription medicine daily to prevent HIV infection. This is called pre-exposure prophylaxis (PrEP). You are considered at risk if:  You are  sexually active and do not regularly use condoms or know the HIV status of your partner(s).  You take drugs by injection.  You are sexually active with a partner who has HIV. Talk with your health care provider about whether you are at high risk of being infected with HIV. If you choose to begin  PrEP, you should first be tested for HIV. You should then be tested every 3 months for as long as you are taking PrEP.  PREGNANCY   If you are premenopausal and you may become pregnant, ask your health care provider about preconception counseling.  If you may become pregnant, take 400 to 800 micrograms (mcg) of folic acid every day.  If you want to prevent pregnancy, talk to your health care provider about birth control (contraception). OSTEOPOROSIS AND MENOPAUSE   Osteoporosis is a disease in which the bones lose minerals and strength with aging. This can result in serious bone fractures. Your risk for osteoporosis can be identified using a bone density scan.  If you are 20 years of age or older, or if you are at risk for osteoporosis and fractures, ask your health care provider if you should be screened.  Ask your health care provider whether you should take a calcium or vitamin D supplement to lower your risk for osteoporosis.  Menopause may have certain physical symptoms and risks.  Hormone replacement therapy may reduce some of these symptoms and risks. Talk to your health care provider about whether hormone replacement therapy is right for you.  HOME CARE INSTRUCTIONS   Schedule regular health, dental, and eye exams.  Stay current with your immunizations.   Do not use any tobacco products including cigarettes, chewing tobacco, or electronic cigarettes.  If you are pregnant, do not drink alcohol.  If you are breastfeeding, limit how much and how often you drink alcohol.  Limit alcohol intake to no more than 1 drink per day for nonpregnant women. One drink equals 12 ounces of beer, 5  ounces of wine, or 1 ounces of hard liquor.  Do not use street drugs.  Do not share needles.  Ask your health care provider for help if you need support or information about quitting drugs.  Tell your health care provider if you often feel depressed.  Tell your health care provider if you have ever been abused or do not feel safe at home. Document Released: 10/23/2010 Document Revised: 08/24/2013 Document Reviewed: 03/11/2013 [facility] Patient Information 2015 [place], [place]. This information is not intended to replace advice given to you by your health care provider. Make sure you discuss any questions you have with your health care provider.

## 2015-10-13 NOTE — Progress Notes (Signed)
    Jordan Vang [birth year] [id number]        49 y.o.  G1P1001  for annual exam.  Doing well.  Past medical history,surgical history, problem list, medications, allergies, family history and social history were all reviewed and documented as reviewed in the EPIC chart.  ROS:  Performed with pertinent positives and negatives included in the history, assessment and plan.   Additional significant findings :  Fatigue as evaluated by her primary physician   Exam: Caryn Bee assistant Filed Vitals:   10/13/15 1159  BP: 118/76  Height: 5\' 3"  (1.6 m)  Weight: 200 lb (90.719 kg)   General appearance:  Normal affect, orientation and appearance. Skin: Grossly normal HEENT: Without gross lesions.  No cervical or supraclavicular adenopathy. Thyroid normal.  Lungs:  Clear without wheezing, rales or rhonchi Cardiac: RR, without RMG Abdominal:  Soft, nontender, without masses, guarding, rebound, organomegaly or hernia Breasts:  Examined lying and sitting without masses, retractions, discharge or axillary adenopathy. Pelvic:  Ext/BUS/vagina normal  Cervix normal  Uterus anteverted, normal size, shape and contour, midline and mobile nontender   Adnexa without masses or tenderness    Anus and perineum normal   Rectovaginal normal sphincter tone without palpated masses or tenderness.    Assessment/Plan:  50 y.o. G68P1001 female for annual exam with extended withdrawal menses, oral contraceptives.   1. Patient continues on Yaz equivalent with every three-month withdrawal. Does not notice significant menopausal symptoms during her pill free week. I again reviewed the issues and risks to include increased risk of stroke heart attack DVT. Alternative birth control including IUDs reviewed. At this point the patient's comfortable continuing. Will check [place] at the end of her pill free week with future order placed. Refill 1 year prescribed. Patient is not being followed for any issues as far as  hypertension diabetes and never smoked. 2. PMS. Patient using Zoloft 25 mg daily for PMS symptoms and has done so for years. Prefers to continue and refill 1 year provided. 3. Fatigue. Currently being evaluated by her primary physician. She is going to follow up with her in reference to this. 4. Mammography 07/2015. Continue with annual mammography when due. SBE monthly reviewed. 5. Pap smear/HPV 08/2013. No Pap smear done today. No history of significant abnormal Pap smears. Discussed current screening guidelines and will repeat approaching 5 year interval. 6. Colonoscopy scheduled next week. 7. Health maintenance. No routine lab work done as this is done elsewhere. Follow up 1 year, sooner as needed.   Anastasio Auerbach MD, 12:50 PM 10/13/2015

## 2015-10-20 ENCOUNTER — Ambulatory Visit (AMBULATORY_SURGERY_CENTER): Payer: BLUE CROSS/BLUE SHIELD | Admitting: Internal Medicine

## 2015-10-20 ENCOUNTER — Encounter: Payer: Self-pay | Admitting: Internal Medicine

## 2015-10-20 VITALS — BP 108/55 | HR 53 | Temp 98.3°F | Resp 12 | Ht 63.0 in | Wt 200.0 lb

## 2015-10-20 DIAGNOSIS — Z1211 Encounter for screening for malignant neoplasm of colon: Secondary | ICD-10-CM

## 2015-10-20 MED ORDER — SODIUM CHLORIDE 0.9 % IV SOLN: 500.0000 mL | INTRAVENOUS | Status: DC

## 2015-10-20 NOTE — Op Note (Signed)
[place] Patient Name: Jordan Vang Procedure Date: 10/20/2015 9:01 AM MRN: [id number] Endoscopist: Jerene Bears , MD Age: 50 Referring MD:  Date of Birth: 15-Feb-1966 Gender: Female Account #: [id number] Procedure:                Colonoscopy Indications:              Screening for colorectal malignant neoplasm, This                            is the patient's first colonoscopy Medicines:                Monitored Anesthesia Care Procedure:                Pre-Anesthesia Assessment:                           - Prior to the procedure, a History and Physical                            was performed, and patient medications and                            allergies were reviewed. The patient's tolerance of                            previous anesthesia was also reviewed. The risks                            and benefits of the procedure and the sedation                            options and risks were discussed with the patient.                            All questions were answered, and informed consent                            was obtained. Prior Anticoagulants: The patient has                            taken no previous anticoagulant or antiplatelet                            agents. ASA Grade Assessment: II - A patient with                            mild systemic disease. After reviewing the risks                            and benefits, the patient was deemed in                            satisfactory condition to undergo the procedure.  After obtaining informed consent, the colonoscope                            was passed under direct vision. Throughout the                            procedure, the patient's blood pressure, pulse, and                            oxygen saturations were monitored continuously. The                            Model PCF-H190L [id number]) scope was introduced                            through the anus and advanced  to the the cecum,                            identified by appendiceal orifice and ileocecal                            valve. The colonoscopy was performed without                            difficulty. The patient tolerated the procedure                            well. The quality of the bowel preparation was                            excellent. The ileocecal valve, appendiceal                            orifice, and rectum were photographed. Scope In: 9:11:10 AM Scope Out: 9:19:56 AM Scope Withdrawal Time: 0 hours 6 minutes 33 seconds  Total Procedure Duration: 0 hours 8 minutes 46 seconds  Findings:                 The perianal and digital rectal examinations were                            normal.                           The entire examined colon appeared normal.                           Internal hemorrhoids were found during                            retroflexion. The hemorrhoids were small. Complications:            No immediate complications. Estimated Blood Loss:     Estimated blood loss: none. Impression:               - The entire examined colon is normal.                           -  Small internal hemorrhoids.                           - No specimens collected. Recommendation:           - Patient has a contact number available for                            emergencies. The signs and symptoms of potential                            delayed complications were discussed with the                            patient. Return to normal activities tomorrow.                            Written discharge instructions were provided to the                            patient.                           - Resume previous diet.                           - Continue present medications.                           - Repeat colonoscopy in 10 years for screening                            purposes. Jerene Bears, MD 10/20/2015 9:26:01 AM This report has been signed electronically.

## 2015-10-20 NOTE — Progress Notes (Signed)
Report to PACU, RN, vss, BBS= Clear.  

## 2015-10-20 NOTE — Patient Instructions (Signed)
YOU HAD AN ENDOSCOPIC PROCEDURE TODAY AT THE [facility]:   Refer to the procedure report that was given to you for any specific questions about what was found during the examination.  If the procedure report does not answer your questions, please call your gastroenterologist to clarify.  If you requested that your care partner not be given the details of your procedure findings, then the procedure report has been included in a sealed envelope for you to review at your convenience later.  YOU SHOULD EXPECT: Some feelings of bloating in the abdomen. Passage of more gas than usual.  Walking can help get rid of the air that was put into your GI tract during the procedure and reduce the bloating. If you had a lower endoscopy (such as a colonoscopy or flexible sigmoidoscopy) you may notice spotting of blood in your stool or on the toilet paper. If you underwent a bowel prep for your procedure, you may not have a normal bowel movement for a few days.  Please Note:  You might notice some irritation and congestion in your nose or some drainage.  This is from the oxygen used during your procedure.  There is no need for concern and it should clear up in a day or so.  SYMPTOMS TO REPORT IMMEDIATELY:   Following lower endoscopy (colonoscopy or flexible sigmoidoscopy):  Excessive amounts of blood in the stool  Significant tenderness or worsening of abdominal pains  Swelling of the abdomen that is new, acute  Fever of 100F or higher    For urgent or emergent issues, a gastroenterologist can be reached at any hour by calling [phone].   DIET: Your first meal following the procedure should be a small meal and then it is ok to progress to your normal diet. Heavy or fried foods are harder to digest and may make you feel nauseous or bloated.  Likewise, meals heavy in dairy and vegetables can increase bloating.  Drink plenty of fluids but you should avoid alcoholic beverages for 24  hours.  ACTIVITY:  You should plan to take it easy for the rest of today and you should NOT DRIVE or use heavy machinery until tomorrow (because of the sedation medicines used during the test).    FOLLOW UP: Our staff will call the number listed on your records the next business day following your procedure to check on you and address any questions or concerns that you may have regarding the information given to you following your procedure. If we do not reach you, we will leave a message.  However, if you are feeling well and you are not experiencing any problems, there is no need to return our call.  We will assume that you have returned to your regular daily activities without incident.  If any biopsies were taken you will be contacted by phone or by letter within the next 1-3 weeks.  Please call us at [phone] if you have not heard about the biopsies in 3 weeks.    SIGNATURES/CONFIDENTIALITY: You and/or your care partner have signed paperwork which will be entered into your electronic medical record.  These signatures attest to the fact that that the information above on your After Visit Summary has been reviewed and is understood.  Full responsibility of the confidentiality of this discharge information lies with you and/or your care-partner.   Resume medications. Information given on hemorrhoids and high fiber diet. 

## 2015-10-21 ENCOUNTER — Telehealth: Payer: Self-pay

## 2015-10-21 NOTE — Telephone Encounter (Signed)
  Follow up Call-  Call back number 10/20/2015  Post procedure Call Back phone  # [phone]  Permission to leave phone message Yes     Patient questions:  Do you have a fever, pain , or abdominal swelling? No. Pain Score  0 *  Have you tolerated food without any problems? Yes.    Have you been able to return to your normal activities? Yes.    Do you have any questions about your discharge instructions: Diet   No. Medications  No. Follow up visit  No.  Do you have questions or concerns about your Care? No.  Actions: * If pain score is 4 or above: No action needed, pain <4.

## 2015-10-26 DIAGNOSIS — L910 Hypertrophic scar: Secondary | ICD-10-CM | POA: Diagnosis not present

## 2015-10-26 DIAGNOSIS — L918 Other hypertrophic disorders of the skin: Secondary | ICD-10-CM | POA: Diagnosis not present

## 2015-10-26 DIAGNOSIS — L570 Actinic keratosis: Secondary | ICD-10-CM | POA: Diagnosis not present

## 2015-10-29 ENCOUNTER — Other Ambulatory Visit: Payer: Self-pay | Admitting: Gynecology

## 2015-11-21 ENCOUNTER — Other Ambulatory Visit (INDEPENDENT_AMBULATORY_CARE_PROVIDER_SITE_OTHER): Payer: BLUE CROSS/BLUE SHIELD

## 2015-11-21 ENCOUNTER — Ambulatory Visit (INDEPENDENT_AMBULATORY_CARE_PROVIDER_SITE_OTHER): Payer: BLUE CROSS/BLUE SHIELD | Admitting: Internal Medicine

## 2015-11-21 VITALS — BP 120/78 | HR 75 | Temp 98.2°F | Wt 201.0 lb

## 2015-11-21 DIAGNOSIS — R5383 Other fatigue: Secondary | ICD-10-CM

## 2015-11-21 DIAGNOSIS — E538 Deficiency of other specified B group vitamins: Secondary | ICD-10-CM

## 2015-11-21 DIAGNOSIS — R7989 Other specified abnormal findings of blood chemistry: Secondary | ICD-10-CM

## 2015-11-21 DIAGNOSIS — R946 Abnormal results of thyroid function studies: Secondary | ICD-10-CM | POA: Diagnosis not present

## 2015-11-21 LAB — VITAMIN B12: VITAMIN B 12: 413 pg/mL (ref 211–911)

## 2015-11-21 LAB — T4, FREE: Free T4: 0.68 ng/dL (ref 0.60–1.60)

## 2015-11-21 LAB — TSH: TSH: 4.58 u[IU]/mL — AB (ref 0.35–4.50)

## 2015-11-21 NOTE — Patient Instructions (Signed)
We are checking the B12 and thyroid levels today.   If the b12 is better we will try thyroid medicine to see if this helps. If we do this you take it in the morning (no food or drink for 30 minutes after) and then come back to the lab in about 4-6 weeks to check the levels.   If this is not helpful we can think about getting you checked for sleep apnea as well.

## 2015-11-21 NOTE — Progress Notes (Signed)
Pre visit review using our clinic review tool, if applicable. No additional management support is needed unless otherwise documented below in the visit note. 

## 2015-11-21 NOTE — Progress Notes (Signed)
   Subjective:    Patient ID: Jordan Vang, female    DOB: 1965/06/08, 50 y.o.   MRN: [id number]  HPI The patient is a 50 YO female coming in for follow up of her fatigue and B12 deficiency. She has started taking oral B12 medication since our last visit but is still tired. Sometimes fatigued early in the day. Feels sleepy in the afternoon as well. She felt like it was helping at first but then it has faded some. She denies other changes since last visit. No weight change. Some bowel slowness but denies constipation. Family history of thyroid problems.   Review of Systems  Constitutional: Positive for activity change and fatigue. Negative for appetite change, chills, fever and unexpected weight change.  Respiratory: Negative.   Cardiovascular: Negative.   Gastrointestinal: Negative.   Musculoskeletal: Negative.   Skin: Negative.   Neurological: Negative.       Objective:   Physical Exam  Constitutional: She is oriented to person, place, and time. She appears well-developed and well-nourished.  HENT:  Head: Normocephalic and atraumatic.  Eyes: EOM are normal.  Neck: Normal range of motion.  Cardiovascular: Normal rate and regular rhythm.   Pulmonary/Chest: Effort normal and breath sounds normal. No respiratory distress. She has no wheezes. She has no rales.  Abdominal: Soft. She exhibits no distension. There is no tenderness. There is no rebound.  Musculoskeletal: She exhibits no edema.  Neurological: She is alert and oriented to person, place, and time. Coordination normal.  Skin: Skin is warm and dry.   Vitals:   11/21/15 1607  BP: 120/78  Pulse: 75  Temp: 98.2 F (36.8 C)  SpO2: 98%  Weight: 201 lb (91.2 kg)      Assessment & Plan:

## 2015-11-22 ENCOUNTER — Other Ambulatory Visit: Payer: Self-pay | Admitting: Internal Medicine

## 2015-11-22 ENCOUNTER — Encounter: Payer: Self-pay | Admitting: Internal Medicine

## 2015-11-22 DIAGNOSIS — E038 Other specified hypothyroidism: Secondary | ICD-10-CM

## 2015-11-22 DIAGNOSIS — E538 Deficiency of other specified B group vitamins: Secondary | ICD-10-CM | POA: Insufficient documentation

## 2015-11-22 MED ORDER — LEVOTHYROXINE SODIUM 50 MCG PO TABS: 50.0000 ug | ORAL_TABLET | Freq: Every day | ORAL | 3 refills | Status: DC

## 2015-11-22 NOTE — Assessment & Plan Note (Signed)
She is taking oral replacement and checking levels today. If adequate will try thyroid replacement. If inadequate will start injections to help with levels.

## 2015-11-22 NOTE — Assessment & Plan Note (Signed)
We did discuss the fact that her levels are on the low end of normal and she may feel better with replacement.

## 2015-11-25 ENCOUNTER — Other Ambulatory Visit: Payer: BLUE CROSS/BLUE SHIELD

## 2015-11-25 DIAGNOSIS — Z3009 Encounter for other general counseling and advice on contraception: Secondary | ICD-10-CM

## 2015-11-25 LAB — FOLLICLE STIMULATING HORMONE: FSH: 28.5 m[IU]/mL

## 2016-01-03 ENCOUNTER — Other Ambulatory Visit (INDEPENDENT_AMBULATORY_CARE_PROVIDER_SITE_OTHER): Payer: BLUE CROSS/BLUE SHIELD

## 2016-01-03 DIAGNOSIS — E038 Other specified hypothyroidism: Secondary | ICD-10-CM

## 2016-01-03 LAB — TSH: TSH: 2.42 u[IU]/mL (ref 0.35–4.50)

## 2016-01-03 LAB — T4, FREE: FREE T4: 0.93 ng/dL (ref 0.60–1.60)

## 2016-02-03 ENCOUNTER — Ambulatory Visit (INDEPENDENT_AMBULATORY_CARE_PROVIDER_SITE_OTHER): Payer: BLUE CROSS/BLUE SHIELD

## 2016-02-03 DIAGNOSIS — Z23 Encounter for immunization: Secondary | ICD-10-CM | POA: Diagnosis not present

## 2016-03-19 DIAGNOSIS — D1801 Hemangioma of skin and subcutaneous tissue: Secondary | ICD-10-CM | POA: Diagnosis not present

## 2016-03-19 DIAGNOSIS — L814 Other melanin hyperpigmentation: Secondary | ICD-10-CM | POA: Diagnosis not present

## 2016-03-19 DIAGNOSIS — L821 Other seborrheic keratosis: Secondary | ICD-10-CM | POA: Diagnosis not present

## 2016-03-19 DIAGNOSIS — Z85828 Personal history of other malignant neoplasm of skin: Secondary | ICD-10-CM | POA: Diagnosis not present

## 2016-05-02 DIAGNOSIS — H04123 Dry eye syndrome of bilateral lacrimal glands: Secondary | ICD-10-CM | POA: Diagnosis not present

## 2016-05-02 DIAGNOSIS — H5213 Myopia, bilateral: Secondary | ICD-10-CM | POA: Diagnosis not present

## 2016-05-02 DIAGNOSIS — H524 Presbyopia: Secondary | ICD-10-CM | POA: Diagnosis not present

## 2016-06-27 ENCOUNTER — Other Ambulatory Visit: Payer: Self-pay | Admitting: Gynecology

## 2016-06-27 DIAGNOSIS — Z1231 Encounter for screening mammogram for malignant neoplasm of breast: Secondary | ICD-10-CM

## 2016-08-24 ENCOUNTER — Ambulatory Visit: Payer: BLUE CROSS/BLUE SHIELD

## 2016-10-12 ENCOUNTER — Ambulatory Visit
Admission: RE | Admit: 2016-10-12 | Discharge: 2016-10-12 | Disposition: A | Discharge status: Home / Self Care | Payer: BLUE CROSS/BLUE SHIELD | Source: Ambulatory Visit | Attending: Gynecology | Admitting: Gynecology

## 2016-10-12 DIAGNOSIS — Z1231 Encounter for screening mammogram for malignant neoplasm of breast: Secondary | ICD-10-CM | POA: Diagnosis not present

## 2016-10-15 ENCOUNTER — Ambulatory Visit (INDEPENDENT_AMBULATORY_CARE_PROVIDER_SITE_OTHER): Payer: BLUE CROSS/BLUE SHIELD | Admitting: Gynecology

## 2016-10-15 ENCOUNTER — Encounter: Payer: Self-pay | Admitting: Gynecology

## 2016-10-15 VITALS — BP 118/76 | Ht 63.0 in | Wt 193.0 lb

## 2016-10-15 DIAGNOSIS — N943 Premenstrual tension syndrome: Secondary | ICD-10-CM | POA: Diagnosis not present

## 2016-10-15 DIAGNOSIS — Z01419 Encounter for gynecological examination (general) (routine) without abnormal findings: Secondary | ICD-10-CM | POA: Diagnosis not present

## 2016-10-15 MED ORDER — SERTRALINE HCL 25 MG PO TABS: 25.0000 mg | ORAL_TABLET | Freq: Every day | ORAL | 4 refills | Status: DC

## 2016-10-15 NOTE — Progress Notes (Signed)
    Jordan Vang Keep [birth year] [id number]        50 y.o.  G1P1001 for annual exam.  Doing well.  Past medical history,surgical history, problem list, medications, allergies, family history and social history were all reviewed and documented as reviewed in the EPIC chart.  ROS:  Performed with pertinent positives and negatives included in the history, assessment and plan.   Additional significant findings :  None   Exam: Caryn Bee assistant Vitals:   10/15/16 0904  BP: 118/76  Weight: 193 lb (87.5 kg)  Height: 5\' 3"  (1.6 m)   Body mass index is 34.19 kg/m.  General appearance:  Normal affect, orientation and appearance. Skin: Grossly normal HEENT: Without gross lesions.  No cervical or supraclavicular adenopathy. Thyroid normal.  Lungs:  Clear without wheezing, rales or rhonchi Cardiac: RR, without RMG Abdominal:  Soft, nontender, without masses, guarding, rebound, organomegaly or hernia Breasts:  Examined lying and sitting without masses, retractions, discharge or axillary adenopathy. Pelvic:  Ext, BUS, Vagina: Normal  Cervix: Normal  Uterus: Anteverted, normal size, shape and contour, midline and mobile nontender   Adnexa: Without masses or tenderness    Anus and perineum: Normal   Rectovaginal: Normal sphincter tone without palpated masses or tenderness.    Assessment/Plan:  51 y.o. G96P1001 female for annual exam with every three-month menses, Yaz oral contraceptives.   1. Yaz oral contraceptives. Had end of pill free week FSH this past year at 49. Recommended patient stop her birth control pills now and keep a bleeding calendar and symptom calendar. Use backup contraception for now. If remains without menses and without significant menopausal symptoms will follow. 1 year without menses contraceptive recommendations from [employer] discussed. If irregular bleeding or significant symptoms of menopause patient will call to discuss treatment options. Risks of thrombosis with  continuing oral contraceptives discussed to include stroke heart attack DVT. 2. PMS. Uses Zoloft 25 mg daily and has done so for years with good results. Refill 1 year provided. We'll continue for now until perimenopausal timeframe resolved. 3. Mammography 09/2016. Continue with annual mammography next year. SBE monthly reviewed. Breast exam normal today. 4. Pap smear/HPV 2015. No Pap smear done today. No history of abnormal Pap smears. Plan repeat Pap smear at 5 year interval per current screening guidelines. 5. Colonoscopy 2017. Repeat at their recommended interval. 6. Health maintenance. No routine lab work done as patient reports is done elsewhere. Follow up 1 year, sooner as needed.   Anastasio Auerbach MD, 9:31 AM 10/15/2016

## 2016-10-15 NOTE — Patient Instructions (Signed)
Stop the birth control pills as we discussed. Call me if you have any significant symptoms or bleeding.

## 2016-11-22 ENCOUNTER — Telehealth: Payer: Self-pay | Admitting: Internal Medicine

## 2016-11-23 MED ORDER — LEVOTHYROXINE SODIUM 50 MCG PO TABS: 50.0000 ug | ORAL_TABLET | Freq: Every day | ORAL | 1 refills | Status: DC

## 2016-11-23 NOTE — Telephone Encounter (Signed)
Per office policy sent enough med to local pharmacy until appt.../lmb  

## 2016-11-23 NOTE — Telephone Encounter (Signed)
Pt called regarding this  CPE set up for 01/04/27 w/[facility]  Please send in

## 2017-01-03 ENCOUNTER — Encounter: Payer: BLUE CROSS/BLUE SHIELD | Admitting: Internal Medicine

## 2017-02-09 ENCOUNTER — Ambulatory Visit (INDEPENDENT_AMBULATORY_CARE_PROVIDER_SITE_OTHER): Payer: BLUE CROSS/BLUE SHIELD

## 2017-02-09 DIAGNOSIS — Z23 Encounter for immunization: Secondary | ICD-10-CM | POA: Diagnosis not present

## 2017-02-13 ENCOUNTER — Encounter: Payer: BLUE CROSS/BLUE SHIELD | Admitting: Internal Medicine

## 2017-03-07 DIAGNOSIS — G4719 Other hypersomnia: Secondary | ICD-10-CM | POA: Diagnosis not present

## 2017-03-07 DIAGNOSIS — E039 Hypothyroidism, unspecified: Secondary | ICD-10-CM | POA: Diagnosis not present

## 2017-03-07 DIAGNOSIS — E7800 Pure hypercholesterolemia, unspecified: Secondary | ICD-10-CM | POA: Diagnosis not present

## 2017-03-07 DIAGNOSIS — E669 Obesity, unspecified: Secondary | ICD-10-CM | POA: Diagnosis not present

## 2017-04-02 DIAGNOSIS — L821 Other seborrheic keratosis: Secondary | ICD-10-CM | POA: Diagnosis not present

## 2017-04-02 DIAGNOSIS — D1801 Hemangioma of skin and subcutaneous tissue: Secondary | ICD-10-CM | POA: Diagnosis not present

## 2017-04-02 DIAGNOSIS — L814 Other melanin hyperpigmentation: Secondary | ICD-10-CM | POA: Diagnosis not present

## 2017-04-02 DIAGNOSIS — Z85828 Personal history of other malignant neoplasm of skin: Secondary | ICD-10-CM | POA: Diagnosis not present

## 2017-04-03 DIAGNOSIS — R0681 Apnea, not elsewhere classified: Secondary | ICD-10-CM | POA: Diagnosis not present

## 2017-04-03 DIAGNOSIS — G4719 Other hypersomnia: Secondary | ICD-10-CM | POA: Diagnosis not present

## 2017-05-06 DIAGNOSIS — G4733 Obstructive sleep apnea (adult) (pediatric): Secondary | ICD-10-CM | POA: Diagnosis not present

## 2017-05-13 DIAGNOSIS — G4733 Obstructive sleep apnea (adult) (pediatric): Secondary | ICD-10-CM | POA: Diagnosis not present

## 2017-05-16 DIAGNOSIS — G4733 Obstructive sleep apnea (adult) (pediatric): Secondary | ICD-10-CM | POA: Diagnosis not present

## 2017-06-03 ENCOUNTER — Ambulatory Visit
Admission: RE | Admit: 2017-06-03 | Discharge: 2017-06-03 | Disposition: A | Discharge status: Home / Self Care | Payer: BLUE CROSS/BLUE SHIELD | Source: Ambulatory Visit | Attending: Internal Medicine | Admitting: Internal Medicine

## 2017-06-03 ENCOUNTER — Other Ambulatory Visit: Payer: Self-pay | Admitting: Internal Medicine

## 2017-06-03 DIAGNOSIS — M5412 Radiculopathy, cervical region: Secondary | ICD-10-CM | POA: Diagnosis not present

## 2017-06-03 DIAGNOSIS — Z8639 Personal history of other endocrine, nutritional and metabolic disease: Secondary | ICD-10-CM

## 2017-06-03 DIAGNOSIS — M542 Cervicalgia: Secondary | ICD-10-CM | POA: Diagnosis not present

## 2017-06-04 ENCOUNTER — Other Ambulatory Visit: Payer: Self-pay | Admitting: Internal Medicine

## 2017-06-04 DIAGNOSIS — M5412 Radiculopathy, cervical region: Secondary | ICD-10-CM

## 2017-06-06 DIAGNOSIS — G4733 Obstructive sleep apnea (adult) (pediatric): Secondary | ICD-10-CM | POA: Diagnosis not present

## 2017-06-13 ENCOUNTER — Other Ambulatory Visit: Payer: BLUE CROSS/BLUE SHIELD

## 2017-06-13 DIAGNOSIS — G4733 Obstructive sleep apnea (adult) (pediatric): Secondary | ICD-10-CM | POA: Diagnosis not present

## 2017-07-01 DIAGNOSIS — M542 Cervicalgia: Secondary | ICD-10-CM | POA: Diagnosis not present

## 2017-07-01 DIAGNOSIS — M79642 Pain in left hand: Secondary | ICD-10-CM | POA: Diagnosis not present

## 2017-07-01 DIAGNOSIS — M25512 Pain in left shoulder: Secondary | ICD-10-CM | POA: Diagnosis not present

## 2017-07-01 DIAGNOSIS — M79641 Pain in right hand: Secondary | ICD-10-CM | POA: Diagnosis not present

## 2017-07-01 DIAGNOSIS — M25511 Pain in right shoulder: Secondary | ICD-10-CM | POA: Diagnosis not present

## 2017-07-02 DIAGNOSIS — M4722 Other spondylosis with radiculopathy, cervical region: Secondary | ICD-10-CM | POA: Diagnosis not present

## 2017-07-02 DIAGNOSIS — M542 Cervicalgia: Secondary | ICD-10-CM | POA: Diagnosis not present

## 2017-07-08 DIAGNOSIS — M542 Cervicalgia: Secondary | ICD-10-CM | POA: Diagnosis not present

## 2017-07-08 DIAGNOSIS — M4722 Other spondylosis with radiculopathy, cervical region: Secondary | ICD-10-CM | POA: Diagnosis not present

## 2017-07-11 DIAGNOSIS — G4733 Obstructive sleep apnea (adult) (pediatric): Secondary | ICD-10-CM | POA: Diagnosis not present

## 2017-07-11 DIAGNOSIS — M542 Cervicalgia: Secondary | ICD-10-CM | POA: Diagnosis not present

## 2017-07-11 DIAGNOSIS — M4722 Other spondylosis with radiculopathy, cervical region: Secondary | ICD-10-CM | POA: Diagnosis not present

## 2017-07-15 DIAGNOSIS — M542 Cervicalgia: Secondary | ICD-10-CM | POA: Diagnosis not present

## 2017-07-15 DIAGNOSIS — M4722 Other spondylosis with radiculopathy, cervical region: Secondary | ICD-10-CM | POA: Diagnosis not present

## 2017-07-18 DIAGNOSIS — L739 Follicular disorder, unspecified: Secondary | ICD-10-CM | POA: Diagnosis not present

## 2017-07-18 DIAGNOSIS — L821 Other seborrheic keratosis: Secondary | ICD-10-CM | POA: Diagnosis not present

## 2017-07-18 DIAGNOSIS — L309 Dermatitis, unspecified: Secondary | ICD-10-CM | POA: Diagnosis not present

## 2017-07-19 DIAGNOSIS — M542 Cervicalgia: Secondary | ICD-10-CM | POA: Diagnosis not present

## 2017-07-19 DIAGNOSIS — M4722 Other spondylosis with radiculopathy, cervical region: Secondary | ICD-10-CM | POA: Diagnosis not present

## 2017-07-22 DIAGNOSIS — M4722 Other spondylosis with radiculopathy, cervical region: Secondary | ICD-10-CM | POA: Diagnosis not present

## 2017-07-22 DIAGNOSIS — M542 Cervicalgia: Secondary | ICD-10-CM | POA: Diagnosis not present

## 2017-07-25 DIAGNOSIS — M542 Cervicalgia: Secondary | ICD-10-CM | POA: Diagnosis not present

## 2017-07-25 DIAGNOSIS — M4722 Other spondylosis with radiculopathy, cervical region: Secondary | ICD-10-CM | POA: Diagnosis not present

## 2017-07-29 DIAGNOSIS — E7800 Pure hypercholesterolemia, unspecified: Secondary | ICD-10-CM | POA: Diagnosis not present

## 2017-07-29 DIAGNOSIS — M4722 Other spondylosis with radiculopathy, cervical region: Secondary | ICD-10-CM | POA: Diagnosis not present

## 2017-07-29 DIAGNOSIS — G4733 Obstructive sleep apnea (adult) (pediatric): Secondary | ICD-10-CM | POA: Diagnosis not present

## 2017-07-29 DIAGNOSIS — M542 Cervicalgia: Secondary | ICD-10-CM | POA: Diagnosis not present

## 2017-07-29 DIAGNOSIS — R635 Abnormal weight gain: Secondary | ICD-10-CM | POA: Diagnosis not present

## 2017-08-01 DIAGNOSIS — M4722 Other spondylosis with radiculopathy, cervical region: Secondary | ICD-10-CM | POA: Diagnosis not present

## 2017-08-01 DIAGNOSIS — M542 Cervicalgia: Secondary | ICD-10-CM | POA: Diagnosis not present

## 2017-08-11 DIAGNOSIS — G4733 Obstructive sleep apnea (adult) (pediatric): Secondary | ICD-10-CM | POA: Diagnosis not present

## 2017-08-15 ENCOUNTER — Other Ambulatory Visit: Payer: Self-pay | Admitting: Orthopedic Surgery

## 2017-08-15 DIAGNOSIS — M5030 Other cervical disc degeneration, unspecified cervical region: Secondary | ICD-10-CM | POA: Diagnosis not present

## 2017-08-15 DIAGNOSIS — M79601 Pain in right arm: Secondary | ICD-10-CM

## 2017-08-15 DIAGNOSIS — M79602 Pain in left arm: Principal | ICD-10-CM

## 2017-08-20 ENCOUNTER — Ambulatory Visit
Admission: RE | Admit: 2017-08-20 | Discharge: 2017-08-20 | Disposition: A | Discharge status: Home / Self Care | Payer: BLUE CROSS/BLUE SHIELD | Source: Ambulatory Visit | Attending: Orthopedic Surgery | Admitting: Orthopedic Surgery

## 2017-08-20 DIAGNOSIS — M4802 Spinal stenosis, cervical region: Secondary | ICD-10-CM | POA: Diagnosis not present

## 2017-08-20 DIAGNOSIS — M79601 Pain in right arm: Secondary | ICD-10-CM

## 2017-08-20 DIAGNOSIS — M5030 Other cervical disc degeneration, unspecified cervical region: Secondary | ICD-10-CM

## 2017-08-20 DIAGNOSIS — M79602 Pain in left arm: Principal | ICD-10-CM

## 2017-08-27 DIAGNOSIS — M542 Cervicalgia: Secondary | ICD-10-CM | POA: Diagnosis not present

## 2017-08-27 DIAGNOSIS — G5600 Carpal tunnel syndrome, unspecified upper limb: Secondary | ICD-10-CM | POA: Diagnosis not present

## 2017-08-30 ENCOUNTER — Other Ambulatory Visit: Payer: Self-pay | Admitting: Gynecology

## 2017-08-30 DIAGNOSIS — Z1231 Encounter for screening mammogram for malignant neoplasm of breast: Secondary | ICD-10-CM

## 2017-09-02 DIAGNOSIS — G5602 Carpal tunnel syndrome, left upper limb: Secondary | ICD-10-CM | POA: Diagnosis not present

## 2017-09-02 DIAGNOSIS — M25562 Pain in left knee: Secondary | ICD-10-CM | POA: Diagnosis not present

## 2017-09-02 DIAGNOSIS — M542 Cervicalgia: Secondary | ICD-10-CM | POA: Diagnosis not present

## 2017-09-02 DIAGNOSIS — G5601 Carpal tunnel syndrome, right upper limb: Secondary | ICD-10-CM | POA: Diagnosis not present

## 2017-09-05 DIAGNOSIS — M542 Cervicalgia: Secondary | ICD-10-CM | POA: Diagnosis not present

## 2017-09-05 DIAGNOSIS — M25562 Pain in left knee: Secondary | ICD-10-CM | POA: Diagnosis not present

## 2017-09-05 DIAGNOSIS — G5602 Carpal tunnel syndrome, left upper limb: Secondary | ICD-10-CM | POA: Diagnosis not present

## 2017-09-05 DIAGNOSIS — G5601 Carpal tunnel syndrome, right upper limb: Secondary | ICD-10-CM | POA: Diagnosis not present

## 2017-09-10 DIAGNOSIS — G4733 Obstructive sleep apnea (adult) (pediatric): Secondary | ICD-10-CM | POA: Diagnosis not present

## 2017-09-13 DIAGNOSIS — M542 Cervicalgia: Secondary | ICD-10-CM | POA: Diagnosis not present

## 2017-09-13 DIAGNOSIS — M25562 Pain in left knee: Secondary | ICD-10-CM | POA: Diagnosis not present

## 2017-09-13 DIAGNOSIS — G5601 Carpal tunnel syndrome, right upper limb: Secondary | ICD-10-CM | POA: Diagnosis not present

## 2017-09-13 DIAGNOSIS — G5602 Carpal tunnel syndrome, left upper limb: Secondary | ICD-10-CM | POA: Diagnosis not present

## 2017-09-19 DIAGNOSIS — M25562 Pain in left knee: Secondary | ICD-10-CM | POA: Diagnosis not present

## 2017-09-19 DIAGNOSIS — G5602 Carpal tunnel syndrome, left upper limb: Secondary | ICD-10-CM | POA: Diagnosis not present

## 2017-09-19 DIAGNOSIS — G5601 Carpal tunnel syndrome, right upper limb: Secondary | ICD-10-CM | POA: Diagnosis not present

## 2017-09-19 DIAGNOSIS — M542 Cervicalgia: Secondary | ICD-10-CM | POA: Diagnosis not present

## 2017-09-23 DIAGNOSIS — M25562 Pain in left knee: Secondary | ICD-10-CM | POA: Diagnosis not present

## 2017-09-23 DIAGNOSIS — G5601 Carpal tunnel syndrome, right upper limb: Secondary | ICD-10-CM | POA: Diagnosis not present

## 2017-09-23 DIAGNOSIS — M542 Cervicalgia: Secondary | ICD-10-CM | POA: Diagnosis not present

## 2017-09-23 DIAGNOSIS — G5602 Carpal tunnel syndrome, left upper limb: Secondary | ICD-10-CM | POA: Diagnosis not present

## 2017-10-02 DIAGNOSIS — M25562 Pain in left knee: Secondary | ICD-10-CM | POA: Diagnosis not present

## 2017-10-02 DIAGNOSIS — G5601 Carpal tunnel syndrome, right upper limb: Secondary | ICD-10-CM | POA: Diagnosis not present

## 2017-10-02 DIAGNOSIS — M542 Cervicalgia: Secondary | ICD-10-CM | POA: Diagnosis not present

## 2017-10-02 DIAGNOSIS — G5602 Carpal tunnel syndrome, left upper limb: Secondary | ICD-10-CM | POA: Diagnosis not present

## 2017-10-11 DIAGNOSIS — G4733 Obstructive sleep apnea (adult) (pediatric): Secondary | ICD-10-CM | POA: Diagnosis not present

## 2017-10-16 DIAGNOSIS — G5601 Carpal tunnel syndrome, right upper limb: Secondary | ICD-10-CM | POA: Diagnosis not present

## 2017-10-16 DIAGNOSIS — M542 Cervicalgia: Secondary | ICD-10-CM | POA: Diagnosis not present

## 2017-10-16 DIAGNOSIS — G5602 Carpal tunnel syndrome, left upper limb: Secondary | ICD-10-CM | POA: Diagnosis not present

## 2017-10-16 DIAGNOSIS — M25562 Pain in left knee: Secondary | ICD-10-CM | POA: Diagnosis not present

## 2017-10-17 DIAGNOSIS — Z79899 Other long term (current) drug therapy: Secondary | ICD-10-CM | POA: Diagnosis not present

## 2017-10-17 DIAGNOSIS — E7800 Pure hypercholesterolemia, unspecified: Secondary | ICD-10-CM | POA: Diagnosis not present

## 2017-10-17 DIAGNOSIS — E039 Hypothyroidism, unspecified: Secondary | ICD-10-CM | POA: Diagnosis not present

## 2017-10-17 DIAGNOSIS — G4733 Obstructive sleep apnea (adult) (pediatric): Secondary | ICD-10-CM | POA: Diagnosis not present

## 2017-10-17 DIAGNOSIS — Z0000 Encounter for general adult medical examination without abnormal findings: Secondary | ICD-10-CM | POA: Diagnosis not present

## 2017-10-18 ENCOUNTER — Ambulatory Visit
Admission: RE | Admit: 2017-10-18 | Discharge: 2017-10-18 | Disposition: A | Discharge status: Home / Self Care | Payer: BLUE CROSS/BLUE SHIELD | Source: Ambulatory Visit | Attending: Gynecology | Admitting: Gynecology

## 2017-10-18 DIAGNOSIS — Z1231 Encounter for screening mammogram for malignant neoplasm of breast: Secondary | ICD-10-CM

## 2017-10-22 ENCOUNTER — Other Ambulatory Visit: Payer: Self-pay | Admitting: Internal Medicine

## 2017-10-22 DIAGNOSIS — G4733 Obstructive sleep apnea (adult) (pediatric): Secondary | ICD-10-CM | POA: Diagnosis not present

## 2017-10-22 DIAGNOSIS — Z9189 Other specified personal risk factors, not elsewhere classified: Secondary | ICD-10-CM | POA: Diagnosis not present

## 2017-10-22 DIAGNOSIS — Z7189 Other specified counseling: Secondary | ICD-10-CM | POA: Diagnosis not present

## 2017-10-22 DIAGNOSIS — E7800 Pure hypercholesterolemia, unspecified: Secondary | ICD-10-CM

## 2017-10-22 DIAGNOSIS — G4730 Sleep apnea, unspecified: Secondary | ICD-10-CM

## 2017-10-23 DIAGNOSIS — G5601 Carpal tunnel syndrome, right upper limb: Secondary | ICD-10-CM | POA: Diagnosis not present

## 2017-10-23 DIAGNOSIS — M542 Cervicalgia: Secondary | ICD-10-CM | POA: Diagnosis not present

## 2017-10-23 DIAGNOSIS — M25562 Pain in left knee: Secondary | ICD-10-CM | POA: Diagnosis not present

## 2017-10-23 DIAGNOSIS — G5602 Carpal tunnel syndrome, left upper limb: Secondary | ICD-10-CM | POA: Diagnosis not present

## 2017-10-25 ENCOUNTER — Encounter: Payer: Self-pay | Admitting: Gynecology

## 2017-10-25 ENCOUNTER — Ambulatory Visit (INDEPENDENT_AMBULATORY_CARE_PROVIDER_SITE_OTHER): Payer: BLUE CROSS/BLUE SHIELD | Admitting: Gynecology

## 2017-10-25 VITALS — BP 124/80 | Ht 63.0 in | Wt 215.0 lb

## 2017-10-25 DIAGNOSIS — Z01419 Encounter for gynecological examination (general) (routine) without abnormal findings: Secondary | ICD-10-CM | POA: Diagnosis not present

## 2017-10-25 MED ORDER — SERTRALINE HCL 25 MG PO TABS: 25.0000 mg | ORAL_TABLET | Freq: Every day | ORAL | 4 refills | Status: DC

## 2017-10-25 NOTE — Progress Notes (Signed)
    Jordan Vang [birth year] [id number]        51 y.o.  G1P1001 for annual gynecologic exam.  Without gynecologic complaints  Past medical history,surgical history, problem list, medications, allergies, family history and social history were all reviewed and documented as reviewed in the EPIC chart.  ROS:  Performed with pertinent positives and negatives included in the history, assessment and plan.   Additional significant findings : None   Exam: Caryn Bee assistant Vitals:   10/25/17 1033  BP: 124/80  Weight: 215 lb (97.5 kg)  Height: 5\' 3"  (1.6 m)   Body mass index is 38.09 kg/m.  General appearance:  Normal affect, orientation and appearance. Skin: Grossly normal HEENT: Without gross lesions.  No cervical or supraclavicular adenopathy. Thyroid normal.  Lungs:  Clear without wheezing, rales or rhonchi Cardiac: RR, without RMG Abdominal:  Soft, nontender, without masses, guarding, rebound, organomegaly or hernia Breasts:  Examined lying and sitting without masses, retractions, discharge or axillary adenopathy. Pelvic:  Ext, BUS, Vagina: Normal  Cervix: Normal  Uterus: Anteverted, normal size, shape and contour, midline and mobile nontender   Adnexa: Without masses or tenderness    Anus and perineum: Normal   Rectovaginal: Normal sphincter tone without palpated masses or tenderness.    Assessment/Plan:  52 y.o. G64P1001 female for annual gynecologic exam.   1. Postmenopausal.  Has remained without menses since stopping her pills last year.  Has had hot flushes and sweats but these seem to be resolving.  No vaginal bleeding at all.  Continue to monitor and report any issues or bleeding. 2. PMS.  Has been using Zoloft 25 mg with good results.  Now that she has stopped menses the question as to whether she should try to wean was discussed with her.  Patient is going to decide if she wants to go ahead and do this.  Her only child is going to college this fall and I have  recommended she not wean at this point but stay on until at least next winter and then go ahead and try after the holidays.  Refill x1 year provided. 3. Mammography 09/2017.  Continue with annual mammography next year.  Breast exam normal today. 4. Pap smear/HPV 2015.  No Pap smear done today.  No history of abnormal Pap smears previously.  Plan repeat Pap smear/HPV next year at 5-year interval per current screening guidelines. 5. Colonoscopy 2017.  Repeat at their recommended interval. 6. Health maintenance.  No routine lab work done as patient does this elsewhere.  Follow-up 1 year, sooner as needed.   Anastasio Auerbach MD, 11:16 AM 10/25/2017

## 2017-10-25 NOTE — Patient Instructions (Signed)
Follow-up in 1 year for annual exam, sooner if any issues. 

## 2017-10-29 DIAGNOSIS — M25562 Pain in left knee: Secondary | ICD-10-CM | POA: Diagnosis not present

## 2017-10-29 DIAGNOSIS — M542 Cervicalgia: Secondary | ICD-10-CM | POA: Diagnosis not present

## 2017-10-29 DIAGNOSIS — G5601 Carpal tunnel syndrome, right upper limb: Secondary | ICD-10-CM | POA: Diagnosis not present

## 2017-10-29 DIAGNOSIS — G5602 Carpal tunnel syndrome, left upper limb: Secondary | ICD-10-CM | POA: Diagnosis not present

## 2017-11-04 ENCOUNTER — Other Ambulatory Visit: Payer: BLUE CROSS/BLUE SHIELD

## 2017-11-06 DIAGNOSIS — M542 Cervicalgia: Secondary | ICD-10-CM | POA: Diagnosis not present

## 2017-11-06 DIAGNOSIS — G5602 Carpal tunnel syndrome, left upper limb: Secondary | ICD-10-CM | POA: Diagnosis not present

## 2017-11-06 DIAGNOSIS — M25562 Pain in left knee: Secondary | ICD-10-CM | POA: Diagnosis not present

## 2017-11-06 DIAGNOSIS — G5601 Carpal tunnel syndrome, right upper limb: Secondary | ICD-10-CM | POA: Diagnosis not present

## 2017-11-07 ENCOUNTER — Ambulatory Visit
Admission: RE | Admit: 2017-11-07 | Discharge: 2017-11-07 | Disposition: A | Discharge status: Home / Self Care | Payer: No Typology Code available for payment source | Source: Ambulatory Visit | Attending: Internal Medicine | Admitting: Internal Medicine

## 2017-11-07 DIAGNOSIS — G4730 Sleep apnea, unspecified: Secondary | ICD-10-CM

## 2017-11-07 DIAGNOSIS — E7800 Pure hypercholesterolemia, unspecified: Secondary | ICD-10-CM

## 2017-11-10 DIAGNOSIS — G4733 Obstructive sleep apnea (adult) (pediatric): Secondary | ICD-10-CM | POA: Diagnosis not present

## 2017-11-11 DIAGNOSIS — E7800 Pure hypercholesterolemia, unspecified: Secondary | ICD-10-CM | POA: Diagnosis not present

## 2017-11-21 DIAGNOSIS — M542 Cervicalgia: Secondary | ICD-10-CM | POA: Diagnosis not present

## 2017-11-21 DIAGNOSIS — M25562 Pain in left knee: Secondary | ICD-10-CM | POA: Diagnosis not present

## 2017-11-21 DIAGNOSIS — G5602 Carpal tunnel syndrome, left upper limb: Secondary | ICD-10-CM | POA: Diagnosis not present

## 2017-11-21 DIAGNOSIS — G5601 Carpal tunnel syndrome, right upper limb: Secondary | ICD-10-CM | POA: Diagnosis not present

## 2017-11-28 DIAGNOSIS — M25562 Pain in left knee: Secondary | ICD-10-CM | POA: Diagnosis not present

## 2017-11-28 DIAGNOSIS — M542 Cervicalgia: Secondary | ICD-10-CM | POA: Diagnosis not present

## 2017-11-28 DIAGNOSIS — G5601 Carpal tunnel syndrome, right upper limb: Secondary | ICD-10-CM | POA: Diagnosis not present

## 2017-11-28 DIAGNOSIS — G5602 Carpal tunnel syndrome, left upper limb: Secondary | ICD-10-CM | POA: Diagnosis not present

## 2017-12-11 DIAGNOSIS — M542 Cervicalgia: Secondary | ICD-10-CM | POA: Diagnosis not present

## 2017-12-11 DIAGNOSIS — G5602 Carpal tunnel syndrome, left upper limb: Secondary | ICD-10-CM | POA: Diagnosis not present

## 2017-12-11 DIAGNOSIS — G5601 Carpal tunnel syndrome, right upper limb: Secondary | ICD-10-CM | POA: Diagnosis not present

## 2017-12-11 DIAGNOSIS — G4733 Obstructive sleep apnea (adult) (pediatric): Secondary | ICD-10-CM | POA: Diagnosis not present

## 2017-12-11 DIAGNOSIS — M25562 Pain in left knee: Secondary | ICD-10-CM | POA: Diagnosis not present

## 2017-12-12 DIAGNOSIS — G4733 Obstructive sleep apnea (adult) (pediatric): Secondary | ICD-10-CM | POA: Diagnosis not present

## 2017-12-18 DIAGNOSIS — M542 Cervicalgia: Secondary | ICD-10-CM | POA: Diagnosis not present

## 2017-12-18 DIAGNOSIS — G5602 Carpal tunnel syndrome, left upper limb: Secondary | ICD-10-CM | POA: Diagnosis not present

## 2017-12-18 DIAGNOSIS — M25562 Pain in left knee: Secondary | ICD-10-CM | POA: Diagnosis not present

## 2017-12-18 DIAGNOSIS — G5601 Carpal tunnel syndrome, right upper limb: Secondary | ICD-10-CM | POA: Diagnosis not present

## 2017-12-25 DIAGNOSIS — G5602 Carpal tunnel syndrome, left upper limb: Secondary | ICD-10-CM | POA: Diagnosis not present

## 2017-12-25 DIAGNOSIS — M25562 Pain in left knee: Secondary | ICD-10-CM | POA: Diagnosis not present

## 2017-12-25 DIAGNOSIS — M542 Cervicalgia: Secondary | ICD-10-CM | POA: Diagnosis not present

## 2017-12-25 DIAGNOSIS — G5601 Carpal tunnel syndrome, right upper limb: Secondary | ICD-10-CM | POA: Diagnosis not present

## 2018-01-02 DIAGNOSIS — M25562 Pain in left knee: Secondary | ICD-10-CM | POA: Diagnosis not present

## 2018-01-02 DIAGNOSIS — G5601 Carpal tunnel syndrome, right upper limb: Secondary | ICD-10-CM | POA: Diagnosis not present

## 2018-01-02 DIAGNOSIS — M542 Cervicalgia: Secondary | ICD-10-CM | POA: Diagnosis not present

## 2018-01-02 DIAGNOSIS — G5602 Carpal tunnel syndrome, left upper limb: Secondary | ICD-10-CM | POA: Diagnosis not present

## 2018-01-08 DIAGNOSIS — M25562 Pain in left knee: Secondary | ICD-10-CM | POA: Diagnosis not present

## 2018-01-08 DIAGNOSIS — G5602 Carpal tunnel syndrome, left upper limb: Secondary | ICD-10-CM | POA: Diagnosis not present

## 2018-01-08 DIAGNOSIS — G5601 Carpal tunnel syndrome, right upper limb: Secondary | ICD-10-CM | POA: Diagnosis not present

## 2018-01-08 DIAGNOSIS — M542 Cervicalgia: Secondary | ICD-10-CM | POA: Diagnosis not present

## 2018-01-10 DIAGNOSIS — G5601 Carpal tunnel syndrome, right upper limb: Secondary | ICD-10-CM | POA: Diagnosis not present

## 2018-01-10 DIAGNOSIS — G5602 Carpal tunnel syndrome, left upper limb: Secondary | ICD-10-CM | POA: Diagnosis not present

## 2018-01-10 DIAGNOSIS — M542 Cervicalgia: Secondary | ICD-10-CM | POA: Diagnosis not present

## 2018-01-10 DIAGNOSIS — M25562 Pain in left knee: Secondary | ICD-10-CM | POA: Diagnosis not present

## 2018-01-15 DIAGNOSIS — G5602 Carpal tunnel syndrome, left upper limb: Secondary | ICD-10-CM | POA: Diagnosis not present

## 2018-01-15 DIAGNOSIS — M542 Cervicalgia: Secondary | ICD-10-CM | POA: Diagnosis not present

## 2018-01-15 DIAGNOSIS — G5601 Carpal tunnel syndrome, right upper limb: Secondary | ICD-10-CM | POA: Diagnosis not present

## 2018-01-15 DIAGNOSIS — M25562 Pain in left knee: Secondary | ICD-10-CM | POA: Diagnosis not present

## 2018-01-22 DIAGNOSIS — M25562 Pain in left knee: Secondary | ICD-10-CM | POA: Diagnosis not present

## 2018-01-22 DIAGNOSIS — G5602 Carpal tunnel syndrome, left upper limb: Secondary | ICD-10-CM | POA: Diagnosis not present

## 2018-01-22 DIAGNOSIS — G5601 Carpal tunnel syndrome, right upper limb: Secondary | ICD-10-CM | POA: Diagnosis not present

## 2018-01-22 DIAGNOSIS — M542 Cervicalgia: Secondary | ICD-10-CM | POA: Diagnosis not present

## 2018-01-30 DIAGNOSIS — G5601 Carpal tunnel syndrome, right upper limb: Secondary | ICD-10-CM | POA: Diagnosis not present

## 2018-01-30 DIAGNOSIS — M542 Cervicalgia: Secondary | ICD-10-CM | POA: Diagnosis not present

## 2018-01-30 DIAGNOSIS — G5602 Carpal tunnel syndrome, left upper limb: Secondary | ICD-10-CM | POA: Diagnosis not present

## 2018-01-30 DIAGNOSIS — M25562 Pain in left knee: Secondary | ICD-10-CM | POA: Diagnosis not present

## 2018-02-06 DIAGNOSIS — G5602 Carpal tunnel syndrome, left upper limb: Secondary | ICD-10-CM | POA: Diagnosis not present

## 2018-02-06 DIAGNOSIS — G5601 Carpal tunnel syndrome, right upper limb: Secondary | ICD-10-CM | POA: Diagnosis not present

## 2018-02-06 DIAGNOSIS — M25562 Pain in left knee: Secondary | ICD-10-CM | POA: Diagnosis not present

## 2018-02-06 DIAGNOSIS — M542 Cervicalgia: Secondary | ICD-10-CM | POA: Diagnosis not present

## 2018-02-12 DIAGNOSIS — G5602 Carpal tunnel syndrome, left upper limb: Secondary | ICD-10-CM | POA: Diagnosis not present

## 2018-02-12 DIAGNOSIS — M542 Cervicalgia: Secondary | ICD-10-CM | POA: Diagnosis not present

## 2018-02-12 DIAGNOSIS — G5601 Carpal tunnel syndrome, right upper limb: Secondary | ICD-10-CM | POA: Diagnosis not present

## 2018-02-12 DIAGNOSIS — M25562 Pain in left knee: Secondary | ICD-10-CM | POA: Diagnosis not present

## 2018-02-26 DIAGNOSIS — G5601 Carpal tunnel syndrome, right upper limb: Secondary | ICD-10-CM | POA: Diagnosis not present

## 2018-02-26 DIAGNOSIS — G5602 Carpal tunnel syndrome, left upper limb: Secondary | ICD-10-CM | POA: Diagnosis not present

## 2018-02-26 DIAGNOSIS — M25562 Pain in left knee: Secondary | ICD-10-CM | POA: Diagnosis not present

## 2018-02-26 DIAGNOSIS — M542 Cervicalgia: Secondary | ICD-10-CM | POA: Diagnosis not present

## 2018-03-06 DIAGNOSIS — G5602 Carpal tunnel syndrome, left upper limb: Secondary | ICD-10-CM | POA: Diagnosis not present

## 2018-03-06 DIAGNOSIS — M542 Cervicalgia: Secondary | ICD-10-CM | POA: Diagnosis not present

## 2018-03-06 DIAGNOSIS — M25562 Pain in left knee: Secondary | ICD-10-CM | POA: Diagnosis not present

## 2018-03-06 DIAGNOSIS — G5601 Carpal tunnel syndrome, right upper limb: Secondary | ICD-10-CM | POA: Diagnosis not present

## 2018-03-10 DIAGNOSIS — E7800 Pure hypercholesterolemia, unspecified: Secondary | ICD-10-CM | POA: Diagnosis not present

## 2018-03-12 DIAGNOSIS — G5601 Carpal tunnel syndrome, right upper limb: Secondary | ICD-10-CM | POA: Diagnosis not present

## 2018-03-12 DIAGNOSIS — M25562 Pain in left knee: Secondary | ICD-10-CM | POA: Diagnosis not present

## 2018-03-12 DIAGNOSIS — G5602 Carpal tunnel syndrome, left upper limb: Secondary | ICD-10-CM | POA: Diagnosis not present

## 2018-03-12 DIAGNOSIS — M542 Cervicalgia: Secondary | ICD-10-CM | POA: Diagnosis not present

## 2018-03-17 DIAGNOSIS — R911 Solitary pulmonary nodule: Secondary | ICD-10-CM | POA: Diagnosis not present

## 2018-03-17 DIAGNOSIS — E7800 Pure hypercholesterolemia, unspecified: Secondary | ICD-10-CM | POA: Diagnosis not present

## 2018-04-01 DIAGNOSIS — L821 Other seborrheic keratosis: Secondary | ICD-10-CM | POA: Diagnosis not present

## 2018-04-01 DIAGNOSIS — Z85828 Personal history of other malignant neoplasm of skin: Secondary | ICD-10-CM | POA: Diagnosis not present

## 2018-04-01 DIAGNOSIS — L814 Other melanin hyperpigmentation: Secondary | ICD-10-CM | POA: Diagnosis not present

## 2018-04-01 DIAGNOSIS — Z86018 Personal history of other benign neoplasm: Secondary | ICD-10-CM | POA: Diagnosis not present

## 2018-05-16 DIAGNOSIS — H0100B Unspecified blepharitis left eye, upper and lower eyelids: Secondary | ICD-10-CM | POA: Diagnosis not present

## 2018-05-16 DIAGNOSIS — H52203 Unspecified astigmatism, bilateral: Secondary | ICD-10-CM | POA: Diagnosis not present

## 2018-05-16 DIAGNOSIS — H04123 Dry eye syndrome of bilateral lacrimal glands: Secondary | ICD-10-CM | POA: Diagnosis not present

## 2018-05-16 DIAGNOSIS — H0100A Unspecified blepharitis right eye, upper and lower eyelids: Secondary | ICD-10-CM | POA: Diagnosis not present

## 2018-07-13 IMAGING — MR MR CERVICAL SPINE W/O CM
4 of 6 series · 24 of 48 positions shown · non-contrast
Comparison: Cervical spine radiographs 06/03/2017.

CLINICAL DATA: 51-year-old female with 6 months of bilateral arm
and hand pain, tingling, numbness. No known injury.

EXAM:
MRI CERVICAL SPINE WITHOUT CONTRAST
TECHNIQUE: Multiplanar, multisequence MR imaging of the cervical spine was
performed. No intravenous contrast was administered.

[Series 3: T2 · sagittal · 3.3mm · 0.37mm/px · 6 of 12 slices shown (1 of 3)]
[im 1/12]
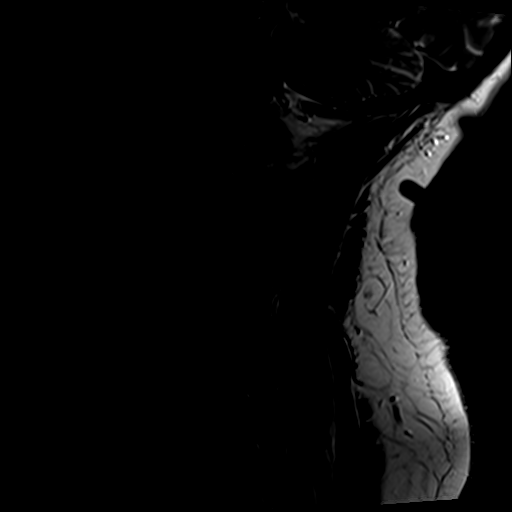
[im 3/12]
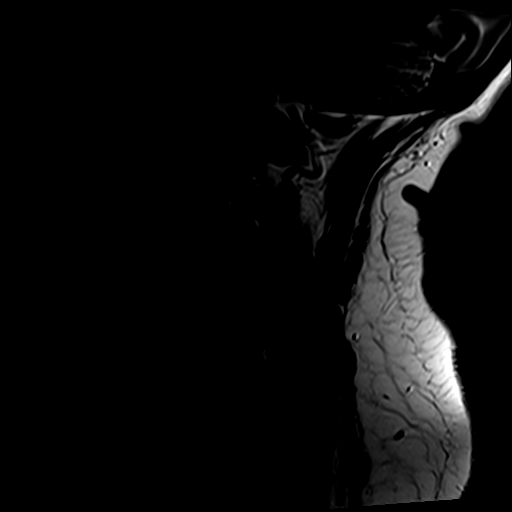
[im 5/12]
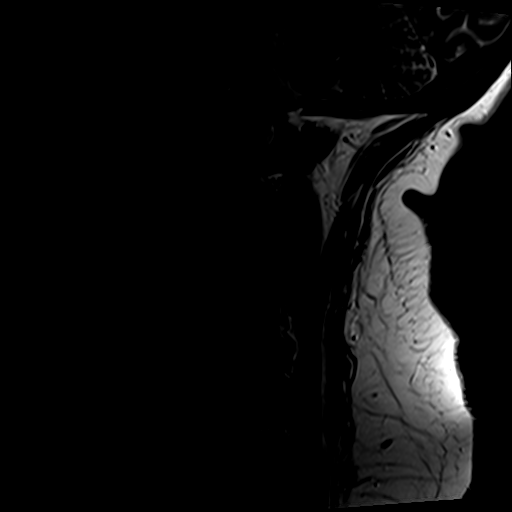
[im 7/12]
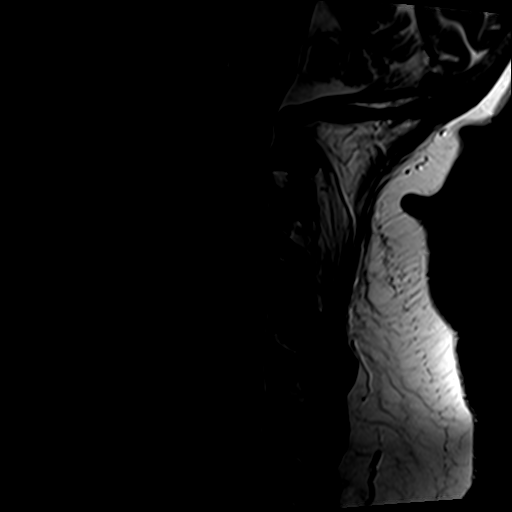
[im 9/12]
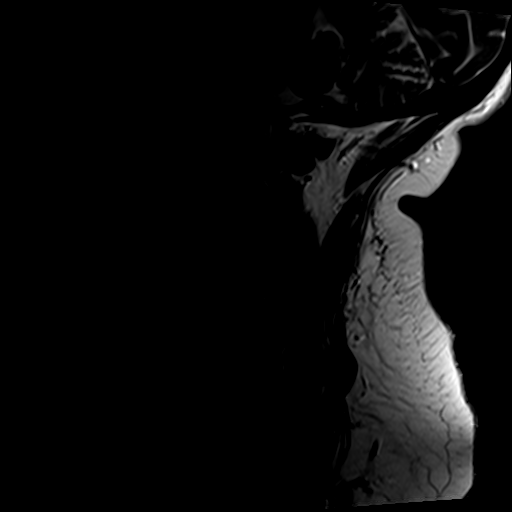
[im 12/12]
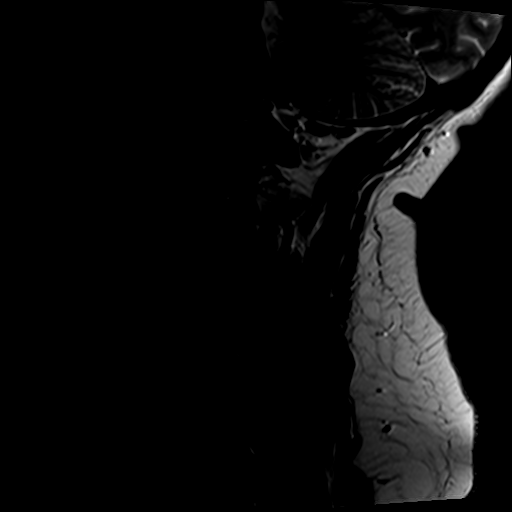

[Series 4: T1 · sagittal · 3.3mm · 0.37mm/px · 3 of 12 slices shown]
[im 3/12]
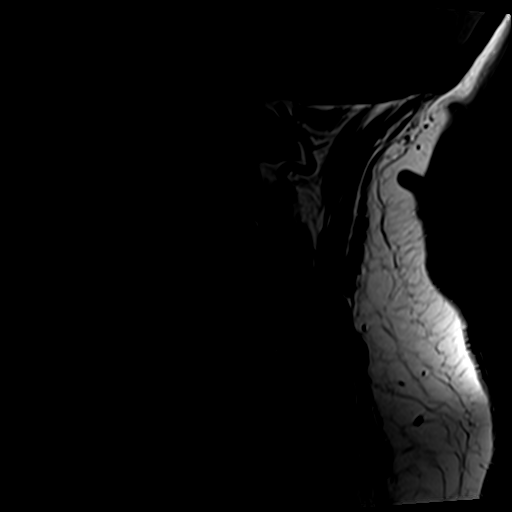
[im 7/12]
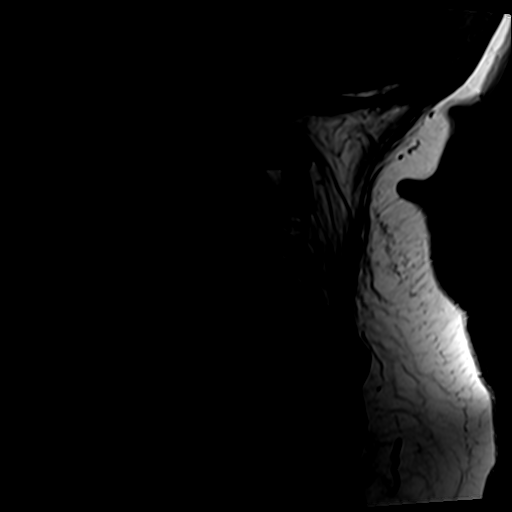
[im 12/12]
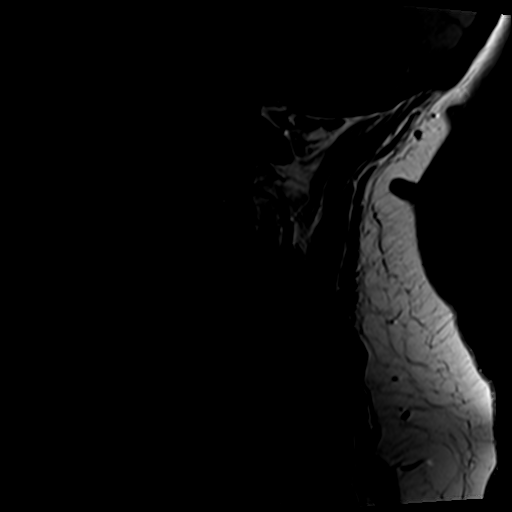

[Series 5: T2 · sagittal · 3.3mm · 0.37mm/px · 6 of 12 slices shown (2 of 3)]
[im 1/12]
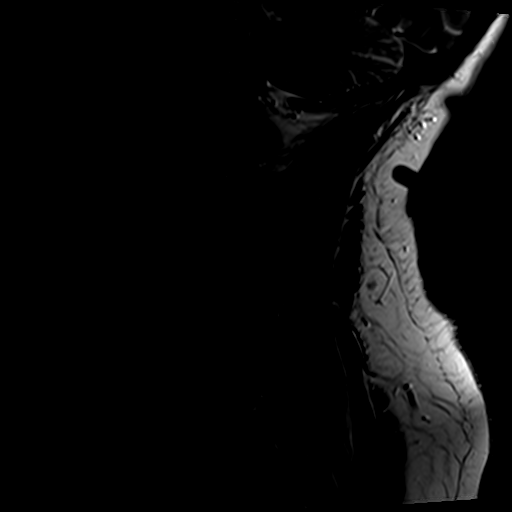
[im 3/12]
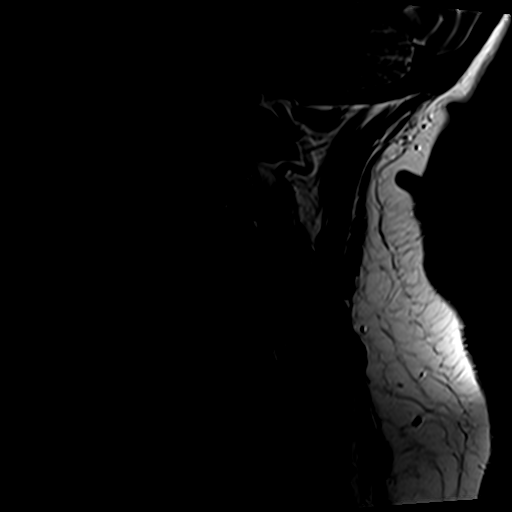
[im 5/12]
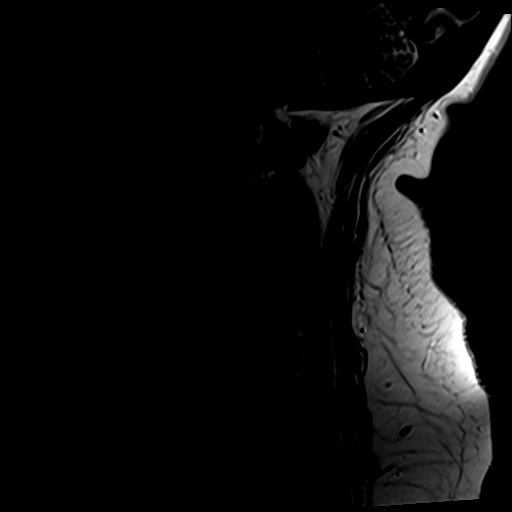
[im 7/12]
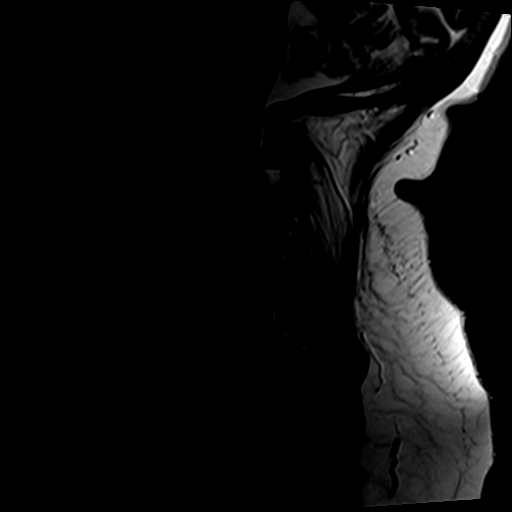
[im 9/12]
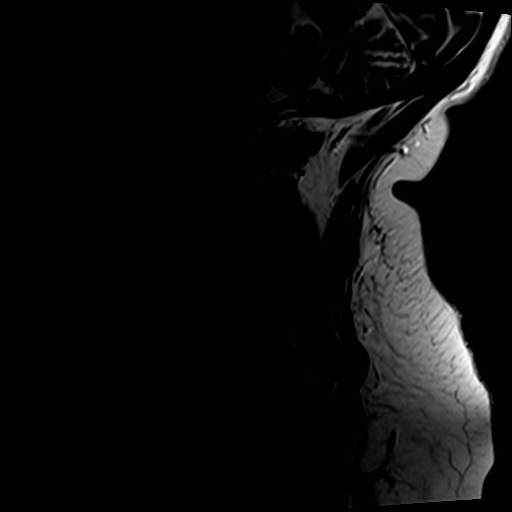
[im 12/12]
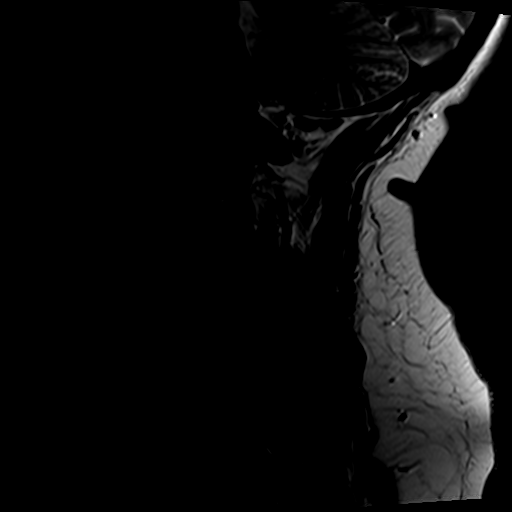

[Series 7: T2 · axial · 3.0mm · 0.70mm/px · z∈[-73,+10]mm · 9 of 24 slices shown (3 of 3)]
[im 1/24]
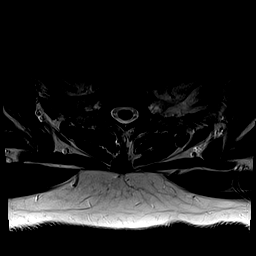
[im 5/24]
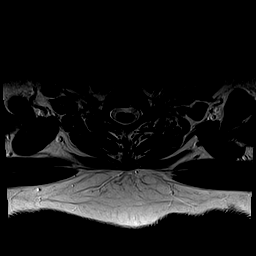
[im 7/24]
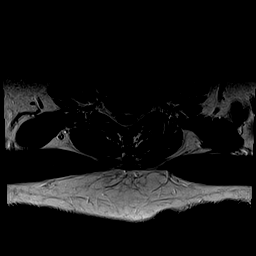
[im 11/24]
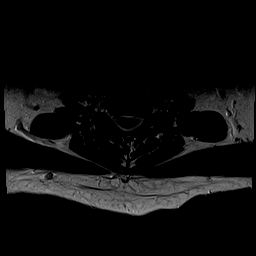
[im 13/24]
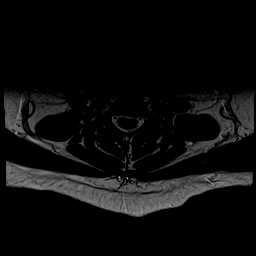
[im 17/24]
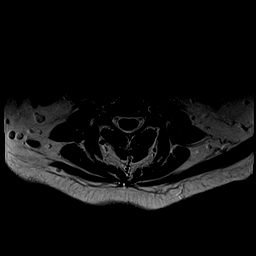
[im 19/24]
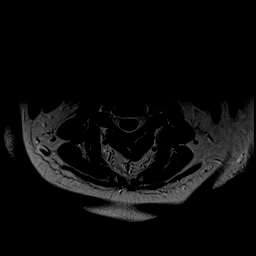
[im 21/24]
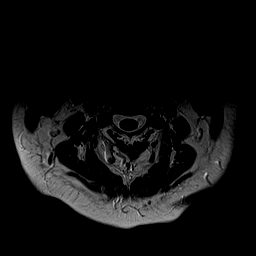
[im 24/24]
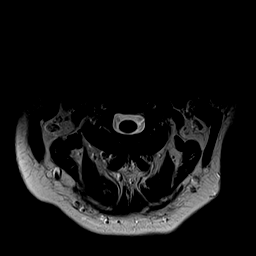

[24 of 48 positions shown; findings below may reference images not displayed]

FINDINGS: Alignment: Straightening and mild reversal of cervical lordosis. 2
millimeters of anterolisthesis of C7 on T1 is accompanied by facet
degeneration, primarily on the left.

Vertebrae: Somewhat elongated appearance of the C2 odontoid process,
but cervical spine segmentation appears to be normal. No marrow
edema or evidence of acute osseous abnormality. Visualized bone
marrow signal is within normal limits.

Cord: Spinal cord signal is within normal limits at all visualized
levels.

Posterior Fossa, vertebral arteries, paraspinal tissues:
Cervicomedullary junction is within normal limits. Negative visible
brain parenchyma. Preserved major vascular flow voids in the neck.
Negative neck soft tissues.

Disc levels:

C2-C3:  Negative.

C3-C4:  Negative.

C4-C5: Mild disc bulging slightly eccentric to the left. No
stenosis.

C5-C6: Mild disc space loss and circumferential disc bulge eccentric
to the left. Left greater than right foraminal disc and endplate
spurring. Mild facet hypertrophy. Mild spinal stenosis with no
definite cord mass effect. Moderate to severe left and mild right C6
foraminal stenosis.

C6-C7: Mild disc space loss with left eccentric disc bulging and
endplate spurring, mostly affecting the left neural foramen.
Borderline to mild spinal stenosis. Mild to moderate left C7 neural
foraminal stenosis.

C7-T1: Anterolisthesis. Moderate to severe left and mild right facet
hypertrophy. Ligament flavum hypertrophy. Mild endplate spurring. No
significant spinal stenosis. Moderate left C8 neural foraminal
stenosis.

Mild upper thoracic endplate spurring and facet hypertrophy greater
on the left. Mild left T1 and T2 neural foraminal stenosis.
IMPRESSION: 1. No acute osseous abnormality in the lumbar spine. 2 mm of
anterolisthesis at C7-T1 is associated with moderate to severe
chronic left side facet degeneration.
2. Superimposed mid and lower cervical disc and endplate
degeneration eccentric to the left. Mild spinal stenosis at C5-C6
and C6-C7. No cord signal abnormality or definite cord mass effect.
3. Moderate or severe neural foraminal stenosis at the left C6, C7,
and C8 nerve levels. Mild right foraminal stenosis.

## 2018-07-25 DIAGNOSIS — G4733 Obstructive sleep apnea (adult) (pediatric): Secondary | ICD-10-CM | POA: Diagnosis not present

## 2018-08-04 DIAGNOSIS — G4733 Obstructive sleep apnea (adult) (pediatric): Secondary | ICD-10-CM | POA: Diagnosis not present

## 2018-09-25 ENCOUNTER — Other Ambulatory Visit: Payer: Self-pay | Admitting: Gynecology

## 2018-09-25 DIAGNOSIS — Z1231 Encounter for screening mammogram for malignant neoplasm of breast: Secondary | ICD-10-CM

## 2018-10-20 DIAGNOSIS — E7800 Pure hypercholesterolemia, unspecified: Secondary | ICD-10-CM | POA: Diagnosis not present

## 2018-10-20 DIAGNOSIS — E039 Hypothyroidism, unspecified: Secondary | ICD-10-CM | POA: Diagnosis not present

## 2018-10-22 DIAGNOSIS — E7800 Pure hypercholesterolemia, unspecified: Secondary | ICD-10-CM | POA: Diagnosis not present

## 2018-10-22 DIAGNOSIS — E669 Obesity, unspecified: Secondary | ICD-10-CM | POA: Diagnosis not present

## 2018-10-22 DIAGNOSIS — E039 Hypothyroidism, unspecified: Secondary | ICD-10-CM | POA: Diagnosis not present

## 2018-10-30 ENCOUNTER — Other Ambulatory Visit: Payer: Self-pay

## 2018-10-31 ENCOUNTER — Encounter: Payer: Self-pay | Admitting: Gynecology

## 2018-10-31 ENCOUNTER — Ambulatory Visit (INDEPENDENT_AMBULATORY_CARE_PROVIDER_SITE_OTHER): Payer: BC Managed Care – PPO | Admitting: Gynecology

## 2018-10-31 VITALS — BP 118/76 | Ht 63.0 in | Wt 212.0 lb

## 2018-10-31 DIAGNOSIS — Z01419 Encounter for gynecological examination (general) (routine) without abnormal findings: Secondary | ICD-10-CM

## 2018-10-31 DIAGNOSIS — Z1151 Encounter for screening for human papillomavirus (HPV): Secondary | ICD-10-CM

## 2018-10-31 MED ORDER — SERTRALINE HCL 25 MG PO TABS: 25.0000 mg | ORAL_TABLET | Freq: Every day | ORAL | 4 refills | Status: DC

## 2018-10-31 NOTE — Patient Instructions (Signed)
Follow-up in 1 year for annual exam, sooner as needed. 

## 2018-10-31 NOTE — Addendum Note (Signed)
Addended by: Nelva Nay on: 10/31/2018 12:16 PM   Modules accepted: Orders

## 2018-10-31 NOTE — Progress Notes (Signed)
    Jordan Vang 06/27/1965 [id number]        52 y.o.  G1P1001 for annual gynecologic exam.  Without gynecologic complaints  Past medical history,surgical history, problem list, medications, allergies, family history and social history were all reviewed and documented as reviewed in the EPIC chart.  ROS:  Performed with pertinent positives and negatives included in the history, assessment and plan.   Additional significant findings : None   Exam: Caryn Bee assistant Vitals:   10/31/18 1134  BP: 118/76  Weight: 212 lb (96.2 kg)  Height: 5\' 3"  (1.6 m)   Body mass index is 37.55 kg/m.  General appearance:  Normal affect, orientation and appearance. Skin: Grossly normal HEENT: Without gross lesions.  No cervical or supraclavicular adenopathy. Thyroid normal.  Lungs:  Clear without wheezing, rales or rhonchi Cardiac: RR, without RMG Abdominal:  Soft, nontender, without masses, guarding, rebound, organomegaly or hernia Breasts:  Examined lying and sitting without masses, retractions, discharge or axillary adenopathy. Pelvic:  Ext, BUS, Vagina: Normal  Cervix: Normal  Uterus: Anteverted, normal size, shape and contour, midline and mobile nontender   Adnexa: Without masses or tenderness    Anus and perineum: Normal   Rectovaginal: Normal sphincter tone without palpated masses or tenderness.    Assessment/Plan:  53 y.o. G28P1001 female for annual gynecologic exam.   1. Postmenopausal.  No significant menopausal symptoms or any vaginal bleeding. 2. History of PMS.  On Zoloft 25 mg daily with good results.  We discussed now that she is postmenopausal whether to try to wean.  She was going to try last year but had a lot of stressors and decided to stay on it.  Given the COVID issue now I have recommended she stay on it for now.  Patient agrees.  Refill x1 year provided. 3. Mammography scheduled and she will follow-up for this.  Breast exam normal today. 4. Pap smear/HPV 2015.  Pap  smear/HPV today.  No history of abnormal Pap smears. 5. Colonoscopy 2017.  Repeat at their recommended interval. 6. DEXA never.  Will plan further into the menopause. 7. Health maintenance.  No routine lab work done as patient does this elsewhere.  Follow-up 1 year, sooner as needed.   Anastasio Auerbach MD, 12:07 PM 10/31/2018

## 2018-11-03 LAB — PAP IG AND HPV HIGH-RISK: HPV DNA High Risk: NOT DETECTED

## 2018-11-17 ENCOUNTER — Ambulatory Visit
Admission: RE | Admit: 2018-11-17 | Discharge: 2018-11-17 | Disposition: A | Discharge status: Home / Self Care | Payer: BC Managed Care – PPO | Source: Ambulatory Visit | Attending: Gynecology | Admitting: Gynecology

## 2018-11-17 ENCOUNTER — Other Ambulatory Visit: Payer: Self-pay

## 2018-11-17 DIAGNOSIS — Z1231 Encounter for screening mammogram for malignant neoplasm of breast: Secondary | ICD-10-CM

## 2018-11-26 ENCOUNTER — Telehealth: Payer: Self-pay | Admitting: *Deleted

## 2018-11-26 DIAGNOSIS — G4733 Obstructive sleep apnea (adult) (pediatric): Secondary | ICD-10-CM | POA: Diagnosis not present

## 2018-11-26 NOTE — Telephone Encounter (Signed)
Patient called and left message asking if pap smear from 10/31/18 was normal. I called and left detailed message on cell letting her know pap normal.

## 2019-01-05 DIAGNOSIS — Z79899 Other long term (current) drug therapy: Secondary | ICD-10-CM | POA: Diagnosis not present

## 2019-01-05 DIAGNOSIS — E7800 Pure hypercholesterolemia, unspecified: Secondary | ICD-10-CM | POA: Diagnosis not present

## 2019-01-05 DIAGNOSIS — E039 Hypothyroidism, unspecified: Secondary | ICD-10-CM | POA: Diagnosis not present

## 2019-01-14 ENCOUNTER — Encounter: Payer: Self-pay | Admitting: Gynecology

## 2019-01-15 DIAGNOSIS — Z79899 Other long term (current) drug therapy: Secondary | ICD-10-CM | POA: Diagnosis not present

## 2019-01-15 DIAGNOSIS — E039 Hypothyroidism, unspecified: Secondary | ICD-10-CM | POA: Diagnosis not present

## 2019-01-15 DIAGNOSIS — Z0000 Encounter for general adult medical examination without abnormal findings: Secondary | ICD-10-CM | POA: Diagnosis not present

## 2019-01-15 DIAGNOSIS — Z23 Encounter for immunization: Secondary | ICD-10-CM | POA: Diagnosis not present

## 2019-04-07 DIAGNOSIS — Z86018 Personal history of other benign neoplasm: Secondary | ICD-10-CM | POA: Diagnosis not present

## 2019-04-07 DIAGNOSIS — Z85828 Personal history of other malignant neoplasm of skin: Secondary | ICD-10-CM | POA: Diagnosis not present

## 2019-04-07 DIAGNOSIS — L821 Other seborrheic keratosis: Secondary | ICD-10-CM | POA: Diagnosis not present

## 2019-04-07 DIAGNOSIS — L814 Other melanin hyperpigmentation: Secondary | ICD-10-CM | POA: Diagnosis not present

## 2019-04-29 DIAGNOSIS — G4733 Obstructive sleep apnea (adult) (pediatric): Secondary | ICD-10-CM | POA: Diagnosis not present

## 2019-06-03 DIAGNOSIS — M75111 Incomplete rotator cuff tear or rupture of right shoulder, not specified as traumatic: Secondary | ICD-10-CM | POA: Diagnosis not present

## 2019-06-03 DIAGNOSIS — M25511 Pain in right shoulder: Secondary | ICD-10-CM | POA: Diagnosis not present

## 2019-06-16 ENCOUNTER — Other Ambulatory Visit: Payer: Self-pay | Admitting: Family Medicine

## 2019-06-16 DIAGNOSIS — M25511 Pain in right shoulder: Secondary | ICD-10-CM

## 2019-08-03 DIAGNOSIS — G4733 Obstructive sleep apnea (adult) (pediatric): Secondary | ICD-10-CM | POA: Diagnosis not present

## 2019-08-12 DIAGNOSIS — H5213 Myopia, bilateral: Secondary | ICD-10-CM | POA: Diagnosis not present

## 2019-08-12 DIAGNOSIS — H02831 Dermatochalasis of right upper eyelid: Secondary | ICD-10-CM | POA: Diagnosis not present

## 2019-08-12 DIAGNOSIS — H10413 Chronic giant papillary conjunctivitis, bilateral: Secondary | ICD-10-CM | POA: Diagnosis not present

## 2019-08-12 DIAGNOSIS — H02834 Dermatochalasis of left upper eyelid: Secondary | ICD-10-CM | POA: Diagnosis not present

## 2019-10-05 ENCOUNTER — Other Ambulatory Visit: Payer: Self-pay | Admitting: Internal Medicine

## 2019-10-05 DIAGNOSIS — Z1231 Encounter for screening mammogram for malignant neoplasm of breast: Secondary | ICD-10-CM

## 2019-10-15 DIAGNOSIS — G4733 Obstructive sleep apnea (adult) (pediatric): Secondary | ICD-10-CM | POA: Diagnosis not present

## 2019-10-28 ENCOUNTER — Ambulatory Visit: Payer: BC Managed Care – PPO | Attending: Internal Medicine

## 2019-10-28 DIAGNOSIS — Z20822 Contact with and (suspected) exposure to covid-19: Secondary | ICD-10-CM

## 2019-10-29 LAB — NOVEL CORONAVIRUS, NAA: SARS-CoV-2, NAA: NOT DETECTED

## 2019-10-29 LAB — SARS-COV-2, NAA 2 DAY TAT

## 2019-11-03 ENCOUNTER — Encounter: Payer: BC Managed Care – PPO | Admitting: Obstetrics and Gynecology

## 2019-11-03 ENCOUNTER — Encounter: Payer: BC Managed Care – PPO | Admitting: Gynecology

## 2019-11-04 ENCOUNTER — Encounter: Payer: Self-pay | Admitting: Obstetrics and Gynecology

## 2019-11-04 ENCOUNTER — Other Ambulatory Visit: Payer: Self-pay

## 2019-11-04 ENCOUNTER — Ambulatory Visit (INDEPENDENT_AMBULATORY_CARE_PROVIDER_SITE_OTHER): Payer: BC Managed Care – PPO | Admitting: Obstetrics and Gynecology

## 2019-11-04 VITALS — BP 124/82 | Ht 63.0 in | Wt 212.0 lb

## 2019-11-04 DIAGNOSIS — Z01419 Encounter for gynecological examination (general) (routine) without abnormal findings: Secondary | ICD-10-CM | POA: Diagnosis not present

## 2019-11-04 NOTE — Progress Notes (Signed)
   Jordan Vang [birth year] [id number]  SUBJECTIVE:  54 y.o. G48P1001 female for annual routine gynecologic exam and Pap smear. She has no gynecologic concerns.  Current Outpatient Medications  Medication Sig Dispense Refill  . atorvastatin (LIPITOR) 10 MG tablet Take 10 mg by mouth daily.    Marland Kitchen Fexofenadine HCl (ALLEGRA PO) Take by mouth.    . fluticasone (FLONASE) 50 MCG/ACT nasal spray USE 1 SPRAY IN EACH NOSTRIL ONCE DAILY. 16 g 3  . ibuprofen (ADVIL,MOTRIN) 200 MG tablet Take 200 mg by mouth every 6 (six) hours as needed (as needed). As needed    . levothyroxine (SYNTHROID, LEVOTHROID) 50 MCG tablet Take 1 tablet (50 mcg total) by mouth daily. Must keep 01/03/17 appt for future refills 60 tablet 1  . MELATONIN PO Take by mouth.    . sertraline (ZOLOFT) 25 MG tablet Take 1 tablet (25 mg total) by mouth daily. 90 tablet 4   No current facility-administered medications for this visit.   Allergies: Patient has no known allergies.  Patient's last menstrual period was 08/27/2016.  Past medical history,surgical history, problem list, medications, allergies, family history and social history were all reviewed and documented as reviewed in the EPIC chart.  ROS:  Feeling well. No dyspnea or chest pain on exertion.  No abdominal pain, change in bowel habits, black or bloody stools.  No urinary tract symptoms. GYN ROS: no abnormal bleeding, pelvic pain or discharge, no breast pain or new or enlarging lumps on self exam.No neurological complaints.   OBJECTIVE:  BP 124/82   Ht 5\' 3"  (1.6 m)   Wt 212 lb (96.2 kg)   LMP 08/27/2016   BMI 37.55 kg/m  The patient appears well, alert, oriented x 3, in no distress. ENT normal.  Neck supple. No cervical or supraclavicular adenopathy or thyromegaly.  Lungs are clear, good air entry, no wheezes, rhonchi or rales. S1 and S2 normal, no murmurs, regular rate and rhythm.  Abdomen soft without tenderness, guarding, mass or organomegaly.  Neurological is  normal, no focal findings.  BREAST EXAM: breasts appear normal, no suspicious masses, no skin or nipple changes or axillary nodes  PELVIC EXAM: VULVA: normal appearing vulva with no masses, tenderness or lesions, atrophic changes, VAGINA: normal appearing vagina with normal color and discharge, no lesions, CERVIX: normal appearing cervix without discharge or lesions, UTERUS: uterus is normal size, shape, consistency and nontender, ADNEXA: normal adnexa in size, nontender and no masses  Chaperone: Caryn Bee present during the examination  ASSESSMENT:  54 y.o. G1P1001 here for annual gynecologic exam  PLAN:   1. Postmenopausal.  Manageable hot flashes, no night sweats.  No vaginal bleeding. 2. History of PMDD.  Uses Zoloft 25 mg nightly which we will continue.  Refill available when she needs it she will call. 3. Pap smear/HPV 10/2018.  No significant history of abnormal Pap smears.  Next Pap smear due 2025 following the current guidelines recommending the 5 year interval. 4. Mammogram 10/2018.  Normal breast exam today.  Next mammogram looks like it scheduled for 11/24/2019.   5. Colonoscopy 2017.  Recommended that she follow up at the recommended interval.   6. DEXA never. Will plan when further into menopause. 7. Health maintenance.  No labs today as she normally has these completed elsewhere.  Return annually or sooner, prn.  Joseph Pierini MD 11/04/19

## 2019-11-09 DIAGNOSIS — J209 Acute bronchitis, unspecified: Secondary | ICD-10-CM | POA: Diagnosis not present

## 2019-11-24 ENCOUNTER — Ambulatory Visit
Admission: RE | Admit: 2019-11-24 | Discharge: 2019-11-24 | Disposition: A | Discharge status: Home / Self Care | Payer: BC Managed Care – PPO | Source: Ambulatory Visit | Attending: Internal Medicine | Admitting: Internal Medicine

## 2019-11-24 ENCOUNTER — Other Ambulatory Visit: Payer: Self-pay

## 2019-11-24 DIAGNOSIS — Z1231 Encounter for screening mammogram for malignant neoplasm of breast: Secondary | ICD-10-CM | POA: Diagnosis not present

## 2019-12-07 DIAGNOSIS — D485 Neoplasm of uncertain behavior of skin: Secondary | ICD-10-CM | POA: Diagnosis not present

## 2019-12-07 DIAGNOSIS — C44529 Squamous cell carcinoma of skin of other part of trunk: Secondary | ICD-10-CM | POA: Diagnosis not present

## 2019-12-07 DIAGNOSIS — L821 Other seborrheic keratosis: Secondary | ICD-10-CM | POA: Diagnosis not present

## 2019-12-12 DIAGNOSIS — M50322 Other cervical disc degeneration at C5-C6 level: Secondary | ICD-10-CM | POA: Diagnosis not present

## 2019-12-12 DIAGNOSIS — M47812 Spondylosis without myelopathy or radiculopathy, cervical region: Secondary | ICD-10-CM | POA: Diagnosis not present

## 2019-12-12 DIAGNOSIS — M50323 Other cervical disc degeneration at C6-C7 level: Secondary | ICD-10-CM | POA: Diagnosis not present

## 2019-12-12 DIAGNOSIS — M24852 Other specific joint derangements of left hip, not elsewhere classified: Secondary | ICD-10-CM | POA: Diagnosis not present

## 2019-12-16 DIAGNOSIS — M50323 Other cervical disc degeneration at C6-C7 level: Secondary | ICD-10-CM | POA: Diagnosis not present

## 2019-12-16 DIAGNOSIS — M50322 Other cervical disc degeneration at C5-C6 level: Secondary | ICD-10-CM | POA: Diagnosis not present

## 2019-12-16 DIAGNOSIS — M24852 Other specific joint derangements of left hip, not elsewhere classified: Secondary | ICD-10-CM | POA: Diagnosis not present

## 2019-12-16 DIAGNOSIS — M47812 Spondylosis without myelopathy or radiculopathy, cervical region: Secondary | ICD-10-CM | POA: Diagnosis not present

## 2019-12-22 DIAGNOSIS — C44529 Squamous cell carcinoma of skin of other part of trunk: Secondary | ICD-10-CM | POA: Diagnosis not present

## 2019-12-22 DIAGNOSIS — M50322 Other cervical disc degeneration at C5-C6 level: Secondary | ICD-10-CM | POA: Diagnosis not present

## 2019-12-22 DIAGNOSIS — M50323 Other cervical disc degeneration at C6-C7 level: Secondary | ICD-10-CM | POA: Diagnosis not present

## 2019-12-22 DIAGNOSIS — M47812 Spondylosis without myelopathy or radiculopathy, cervical region: Secondary | ICD-10-CM | POA: Diagnosis not present

## 2019-12-22 DIAGNOSIS — M24852 Other specific joint derangements of left hip, not elsewhere classified: Secondary | ICD-10-CM | POA: Diagnosis not present

## 2019-12-24 DIAGNOSIS — M50323 Other cervical disc degeneration at C6-C7 level: Secondary | ICD-10-CM | POA: Diagnosis not present

## 2019-12-24 DIAGNOSIS — M50322 Other cervical disc degeneration at C5-C6 level: Secondary | ICD-10-CM | POA: Diagnosis not present

## 2019-12-24 DIAGNOSIS — M47812 Spondylosis without myelopathy or radiculopathy, cervical region: Secondary | ICD-10-CM | POA: Diagnosis not present

## 2019-12-24 DIAGNOSIS — M24852 Other specific joint derangements of left hip, not elsewhere classified: Secondary | ICD-10-CM | POA: Diagnosis not present

## 2019-12-25 ENCOUNTER — Other Ambulatory Visit: Payer: Self-pay | Admitting: *Deleted

## 2019-12-25 MED ORDER — SERTRALINE HCL 25 MG PO TABS: 25.0000 mg | ORAL_TABLET | Freq: Every day | ORAL | 3 refills | Status: DC

## 2019-12-25 NOTE — Telephone Encounter (Signed)
Was prescribed at annual exam on 10/31/18

## 2019-12-31 DIAGNOSIS — M24852 Other specific joint derangements of left hip, not elsewhere classified: Secondary | ICD-10-CM | POA: Diagnosis not present

## 2019-12-31 DIAGNOSIS — M47812 Spondylosis without myelopathy or radiculopathy, cervical region: Secondary | ICD-10-CM | POA: Diagnosis not present

## 2019-12-31 DIAGNOSIS — M50322 Other cervical disc degeneration at C5-C6 level: Secondary | ICD-10-CM | POA: Diagnosis not present

## 2019-12-31 DIAGNOSIS — M50323 Other cervical disc degeneration at C6-C7 level: Secondary | ICD-10-CM | POA: Diagnosis not present

## 2020-01-01 DIAGNOSIS — H43812 Vitreous degeneration, left eye: Secondary | ICD-10-CM | POA: Diagnosis not present

## 2020-01-18 DIAGNOSIS — M1612 Unilateral primary osteoarthritis, left hip: Secondary | ICD-10-CM | POA: Diagnosis not present

## 2020-01-18 DIAGNOSIS — M25511 Pain in right shoulder: Secondary | ICD-10-CM | POA: Diagnosis not present

## 2020-01-18 DIAGNOSIS — M25552 Pain in left hip: Secondary | ICD-10-CM | POA: Diagnosis not present

## 2020-01-18 DIAGNOSIS — M7062 Trochanteric bursitis, left hip: Secondary | ICD-10-CM | POA: Diagnosis not present

## 2020-01-25 DIAGNOSIS — Z23 Encounter for immunization: Secondary | ICD-10-CM | POA: Diagnosis not present

## 2020-01-25 DIAGNOSIS — E7800 Pure hypercholesterolemia, unspecified: Secondary | ICD-10-CM | POA: Diagnosis not present

## 2020-01-25 DIAGNOSIS — Z0000 Encounter for general adult medical examination without abnormal findings: Secondary | ICD-10-CM | POA: Diagnosis not present

## 2020-01-26 DIAGNOSIS — M25511 Pain in right shoulder: Secondary | ICD-10-CM | POA: Diagnosis not present

## 2020-01-26 DIAGNOSIS — M25552 Pain in left hip: Secondary | ICD-10-CM | POA: Diagnosis not present

## 2020-01-29 ENCOUNTER — Encounter (HOSPITAL_COMMUNITY): Payer: Self-pay | Admitting: Emergency Medicine

## 2020-01-29 ENCOUNTER — Emergency Department (HOSPITAL_COMMUNITY)
Admission: EM | Admit: 2020-01-29 | Discharge: 2020-01-29 | Disposition: A | Discharge status: Home / Self Care | Payer: BC Managed Care – PPO | Attending: Emergency Medicine | Admitting: Emergency Medicine

## 2020-01-29 ENCOUNTER — Emergency Department (HOSPITAL_COMMUNITY): Payer: BC Managed Care – PPO

## 2020-01-29 DIAGNOSIS — R0600 Dyspnea, unspecified: Secondary | ICD-10-CM | POA: Diagnosis not present

## 2020-01-29 DIAGNOSIS — M79605 Pain in left leg: Secondary | ICD-10-CM | POA: Diagnosis not present

## 2020-01-29 DIAGNOSIS — R0602 Shortness of breath: Secondary | ICD-10-CM | POA: Diagnosis not present

## 2020-01-29 HISTORY — DX: Disorder of thyroid, unspecified: E07.9

## 2020-01-29 LAB — BASIC METABOLIC PANEL
Anion gap: 9 (ref 5–15)
BUN: 20 mg/dL (ref 6–20)
CO2: 26 mmol/L (ref 22–32)
Calcium: 9.5 mg/dL (ref 8.9–10.3)
Chloride: 107 mmol/L (ref 98–111)
Creatinine, Ser: 0.79 mg/dL (ref 0.44–1.00)
GFR, Estimated: >60 mL/min (ref >60)
Glucose, Bld: 74 mg/dL (ref 70–99)
Potassium: 4.1 mmol/L (ref 3.5–5.1)
Sodium: 142 mmol/L (ref 135–145)

## 2020-01-29 LAB — CBC WITH DIFFERENTIAL/PLATELET
Abs Immature Granulocytes: 0.04 10*3/uL (ref 0.00–0.07)
Basophils Absolute: 0.1 10*3/uL (ref 0.0–0.1)
Basophils Relative: 1 %
Eosinophils Absolute: 0.2 10*3/uL (ref 0.0–0.5)
Eosinophils Relative: 1 %
HCT: 39 % (ref 36.0–46.0)
Hemoglobin: 12.5 g/dL (ref 12.0–15.0)
Immature Granulocytes: 0 %
Lymphocytes Relative: 29 %
Lymphs Abs: 3.4 10*3/uL (ref 0.7–4.0)
MCH: 26.9 pg (ref 26.0–34.0)
MCHC: 32.1 g/dL (ref 30.0–36.0)
MCV: 83.9 fL (ref 80.0–100.0)
Monocytes Absolute: 0.9 10*3/uL (ref 0.1–1.0)
Monocytes Relative: 8 %
Neutro Abs: 7.4 10*3/uL (ref 1.7–7.7)
Neutrophils Relative %: 61 %
Platelets: 262 10*3/uL (ref 150–400)
RBC: 4.65 MIL/uL (ref 3.87–5.11)
RDW: 13.9 % (ref 11.5–15.5)
WBC: 12 10*3/uL — ABNORMAL HIGH (ref 4.0–10.5)
nRBC: 0 % (ref 0.0–0.2)

## 2020-01-29 LAB — TROPONIN I (HIGH SENSITIVITY): Troponin I (High Sensitivity): <2 ng/L (ref <18)

## 2020-01-29 LAB — D-DIMER, QUANTITATIVE: D-Dimer, Quant: <0.27 ug/mL-FEU (ref 0.00–0.50)

## 2020-01-29 NOTE — ED Triage Notes (Signed)
Per pt, states she recently had a steroid shot in left hip for bursitis she is now having left knee and left shin/calf pain-states she was also given a script of meloxicam but states she stopped it due to being SOB-respiratory symptoms did not resolve once she stopped med-states her ortho MD told her to come to ED for DVT work up

## 2020-01-29 NOTE — ED Provider Notes (Addendum)
[facility] DEPT Provider Note   CSN: [id number] Arrival date & time: 01/29/20  1734     History Chief Complaint  Patient presents with  . Shortness of Breath  . Leg Pain    Jordan Vang is a 54 y.o. female.  HPI   Pt states she has been feeling short of breath for the last week or so.  Pt feels like she cant take a deep breath.  No coughing.  No fever.  No chest pain.   Pt also had treatment for hip bursitis recently.  She ahd a steroid injection.  She also noticed that the pain in her left leg is now going to the left calf.  No swelling noted. She called her doctor who suggested she get checked for a blood clot.    Past Medical History:  Diagnosis Date  . Elevated cholesterol   . PMDD (premenstrual dysphoric disorder)    Dr Phineas Real  . Sleep apnea    CPAP  . Thyroid disease     Patient Active Problem List   Diagnosis Date Noted  . B12 deficiency 11/22/2015  . Fatigue 08/16/2015  . Abnormal thyroid blood test 08/02/2014  . Basal cell carcinoma, lip 10/28/2013  . Obesity (BMI 30-39.9) 10/28/2013  . Elevated cholesterol   . PMDD (premenstrual dysphoric disorder)     Past Surgical History:  Procedure Laterality Date  . BASAL CELL CARCINOMA EXCISION     Mohs  . KNEE SURGERY Right 2015  . TONSILLECTOMY       OB History    Gravida  1   Para  1   Term  1   Preterm      AB      Living  1     SAB      TAB      Ectopic      Multiple      Live Births              Family History  Problem Relation Age of Onset  . Breast cancer Mother        Age 73  . Lung cancer Mother        smoker  . Hyperlipidemia Father   . Stroke Father 58  . Heart attack Maternal Grandfather        < 55 ?  . Diabetes Neg Hx   . Colon cancer Neg Hx     Social History   Tobacco Use  . Smoking status: Never Smoker  . Smokeless tobacco: Never Used  Vaping Use  . Vaping Use: Never used  Substance Use Topics  . Alcohol use: Yes     Alcohol/week: 0.0 standard drinks    Comment: socially  . Drug use: No    Home Medications Prior to Admission medications   Medication Sig Start Date End Date Taking? Authorizing Provider  atorvastatin (LIPITOR) 10 MG tablet Take 10 mg by mouth daily.    [provider]  Fexofenadine HCl (ALLEGRA PO) Take by mouth.    [provider]  fluticasone (FLONASE) 50 MCG/ACT nasal spray USE 1 SPRAY IN EACH NOSTRIL ONCE DAILY. 10/31/15   Fontaine, Belinda Block, MD  ibuprofen (ADVIL,MOTRIN) 200 MG tablet Take 200 mg by mouth every 6 (six) hours as needed (as needed). As needed    [provider]  levothyroxine (SYNTHROID, LEVOTHROID) 50 MCG tablet Take 1 tablet (50 mcg total) by mouth daily. Must keep 01/03/17 appt for future refills 11/23/16  Hoyt Koch, MD  MELATONIN PO Take by mouth.    [provider]  sertraline (ZOLOFT) 25 MG tablet Take 1 tablet (25 mg total) by mouth daily. 12/25/19   Joseph Pierini, MD    Allergies    Patient has no known allergies.  Review of Systems   Review of Systems  All other systems reviewed and are negative.   Physical Exam Updated Vital Signs BP 129/73 (BP Location: Right Arm)   Pulse (!) 55   Temp 98.4 F (36.9 C) (Oral)   Resp 14   LMP 08/27/2016   SpO2 100%   Physical Exam Vitals and nursing note reviewed.  Constitutional:      General: She is not in acute distress.    Appearance: She is well-developed.  HENT:     Head: Normocephalic and atraumatic.     Right Ear: External ear normal.     Left Ear: External ear normal.  Eyes:     General: No scleral icterus.       Right eye: No discharge.        Left eye: No discharge.     Conjunctiva/sclera: Conjunctivae normal.  Neck:     Trachea: No tracheal deviation.  Cardiovascular:     Rate and Rhythm: Normal rate and regular rhythm.  Pulmonary:     Effort: Pulmonary effort is normal. No respiratory distress.     Breath sounds: Normal breath sounds. No  stridor. No wheezing or rales.  Abdominal:     General: Bowel sounds are normal. There is no distension.     Palpations: Abdomen is soft.     Tenderness: There is no abdominal tenderness. There is no guarding or rebound.  Musculoskeletal:        General: No tenderness.     Cervical back: Neck supple.     Right lower leg: No tenderness. No edema.     Left lower leg: No tenderness. No edema.  Skin:    General: Skin is warm and dry.     Findings: No rash.  Neurological:     Mental Status: She is alert.     Cranial Nerves: No cranial nerve deficit (no facial droop, extraocular movements intact, no slurred speech).     Sensory: No sensory deficit.     Motor: No abnormal muscle tone or seizure activity.     Coordination: Coordination normal.     ED Results / Procedures / Treatments   Labs (all labs ordered are listed, but only abnormal results are displayed) Labs Reviewed  CBC WITH DIFFERENTIAL/PLATELET - Abnormal; Notable for the following components:      Result Value   WBC 12.0 (*)    All other components within normal limits  BASIC METABOLIC PANEL  D-DIMER, QUANTITATIVE (NOT AT [facility])  TROPONIN I (HIGH SENSITIVITY)    EKG None  Radiology DG Chest 2 View  Result Date: 01/29/2020 CLINICAL DATA:  Shortness of breath EXAM: CHEST - 2 VIEW COMPARISON:  Coronary CT 11/07/2017 FINDINGS: Accounting for body habitus, the lungs are clear. No consolidation, features of edema, pneumothorax, or effusion. Pulmonary vascularity is normally distributed. The cardiomediastinal contours are unremarkable. No acute osseous or soft tissue abnormality. IMPRESSION: No acute cardiopulmonary abnormality. Electronically Signed   By: Lovena Le M.D.   On: 01/29/2020 18:35    Procedures Procedures (including critical care time)  Medications Ordered in ED Medications - No data to display  ED Course  I have reviewed the triage vital signs and the  nursing notes.  Pertinent labs & imaging results  that were available during my care of the patient were reviewed by me and considered in my medical decision making (see chart for details).  Clinical Course as of Jan 28 2137  Fri Jan 29, 2020  2046 D-dimer negative   [JK]  2114 Chest x-ray negative.   [JK]  2114 Labs unremarkable.   [JK]    Clinical Course User Index [JK] Dorie Rank, MD   MDM Rules/Calculators/A&P                          Patient presented to the ED for evaluation of shortness of breath as well as some leg pain.  Patient was instructed by her doctor to come and be evaluated for possible DVT.  On exam the patient does not have any leg swelling or tenderness.  Symptoms do not suggest DVT.  Regarding the patient's shortness of breath objectively she is not tachypneic she is not tachycardic and she has normal oxygen saturation.  Her lung sounds are clear.  Patient has been fully vaccinated for Covid and also has had her booster etiology of her dyspnea is unclear.  Chest x-ray and blood tests are normal.  D-dimer is negative and I doubt DVT and PE.  Patient was sent to the ED when Doppler studies are not available.  Overall I have very low suspicion for DVT but considering she was referred for that specifically I have ordered a follow-up Doppler test to be performed in the morning.  Recommend follow-up with her primary care doctor to discuss further testing such as pulmonary function test or echocardiogram to evaluate her breathing issues further. Final Clinical Impression(s) / ED Diagnoses Final diagnoses:  Dyspnea, unspecified type    Rx / DC Orders ED Discharge Orders         Ordered    LE VENOUS        01/29/20 2135           Dorie Rank, MD 01/29/20 2138    Dorie Rank, MD 02/15/20 [id number]

## 2020-01-29 NOTE — Discharge Instructions (Addendum)
Test today in the ED were reassuring.  Blood test did not suggest a blood clot.  Your chest x-ray was normal.  Please follow-up with your doctor to discuss further testing.  Follow-up with the ultrasound instructions to have the Doppler study performed tomorrow.

## 2020-01-30 ENCOUNTER — Ambulatory Visit (HOSPITAL_COMMUNITY): Admission: RE | Admit: 2020-01-30 | Payer: BC Managed Care – PPO | Source: Ambulatory Visit

## 2020-02-01 DIAGNOSIS — M25511 Pain in right shoulder: Secondary | ICD-10-CM | POA: Diagnosis not present

## 2020-02-01 DIAGNOSIS — M25552 Pain in left hip: Secondary | ICD-10-CM | POA: Diagnosis not present

## 2020-02-01 DIAGNOSIS — G4733 Obstructive sleep apnea (adult) (pediatric): Secondary | ICD-10-CM | POA: Diagnosis not present

## 2020-02-04 DIAGNOSIS — M25552 Pain in left hip: Secondary | ICD-10-CM | POA: Diagnosis not present

## 2020-02-04 DIAGNOSIS — M25511 Pain in right shoulder: Secondary | ICD-10-CM | POA: Diagnosis not present

## 2020-02-09 DIAGNOSIS — M25552 Pain in left hip: Secondary | ICD-10-CM | POA: Diagnosis not present

## 2020-02-09 DIAGNOSIS — H43812 Vitreous degeneration, left eye: Secondary | ICD-10-CM | POA: Diagnosis not present

## 2020-02-09 DIAGNOSIS — M25511 Pain in right shoulder: Secondary | ICD-10-CM | POA: Diagnosis not present

## 2020-02-11 DIAGNOSIS — M25511 Pain in right shoulder: Secondary | ICD-10-CM | POA: Diagnosis not present

## 2020-02-11 DIAGNOSIS — M25552 Pain in left hip: Secondary | ICD-10-CM | POA: Diagnosis not present

## 2020-02-16 DIAGNOSIS — M25511 Pain in right shoulder: Secondary | ICD-10-CM | POA: Diagnosis not present

## 2020-02-16 DIAGNOSIS — L821 Other seborrheic keratosis: Secondary | ICD-10-CM | POA: Diagnosis not present

## 2020-02-16 DIAGNOSIS — M25552 Pain in left hip: Secondary | ICD-10-CM | POA: Diagnosis not present

## 2020-02-18 DIAGNOSIS — M25511 Pain in right shoulder: Secondary | ICD-10-CM | POA: Diagnosis not present

## 2020-02-18 DIAGNOSIS — M25552 Pain in left hip: Secondary | ICD-10-CM | POA: Diagnosis not present

## 2020-02-23 DIAGNOSIS — M25552 Pain in left hip: Secondary | ICD-10-CM | POA: Diagnosis not present

## 2020-02-23 DIAGNOSIS — M25511 Pain in right shoulder: Secondary | ICD-10-CM | POA: Diagnosis not present

## 2020-03-01 DIAGNOSIS — M25552 Pain in left hip: Secondary | ICD-10-CM | POA: Diagnosis not present

## 2020-03-01 DIAGNOSIS — M25511 Pain in right shoulder: Secondary | ICD-10-CM | POA: Diagnosis not present

## 2020-03-02 DIAGNOSIS — F4320 Adjustment disorder, unspecified: Secondary | ICD-10-CM | POA: Diagnosis not present

## 2020-03-08 DIAGNOSIS — M25552 Pain in left hip: Secondary | ICD-10-CM | POA: Diagnosis not present

## 2020-03-08 DIAGNOSIS — M25511 Pain in right shoulder: Secondary | ICD-10-CM | POA: Diagnosis not present

## 2020-03-10 DIAGNOSIS — F4323 Adjustment disorder with mixed anxiety and depressed mood: Secondary | ICD-10-CM | POA: Diagnosis not present

## 2020-03-22 DIAGNOSIS — M25552 Pain in left hip: Secondary | ICD-10-CM | POA: Diagnosis not present

## 2020-03-22 DIAGNOSIS — M25511 Pain in right shoulder: Secondary | ICD-10-CM | POA: Diagnosis not present

## 2020-03-23 DIAGNOSIS — F4323 Adjustment disorder with mixed anxiety and depressed mood: Secondary | ICD-10-CM | POA: Diagnosis not present

## 2020-03-29 DIAGNOSIS — M25552 Pain in left hip: Secondary | ICD-10-CM | POA: Diagnosis not present

## 2020-03-29 DIAGNOSIS — M25511 Pain in right shoulder: Secondary | ICD-10-CM | POA: Diagnosis not present

## 2020-03-29 DIAGNOSIS — F4323 Adjustment disorder with mixed anxiety and depressed mood: Secondary | ICD-10-CM | POA: Diagnosis not present

## 2020-03-30 ENCOUNTER — Telehealth: Payer: Self-pay

## 2020-03-30 MED ORDER — SERTRALINE HCL 25 MG PO TABS: 25.0000 mg | ORAL_TABLET | Freq: Every day | ORAL | 2 refills | Status: DC

## 2020-03-30 NOTE — Telephone Encounter (Signed)
Patient called stating she needs her Setraline Rx sent to different pharmacy due to insurance.  Rx sent.  Rx at [facility] was cancelled with pharmacy.  Patient informed.

## 2020-04-05 DIAGNOSIS — M25552 Pain in left hip: Secondary | ICD-10-CM | POA: Diagnosis not present

## 2020-04-05 DIAGNOSIS — M25511 Pain in right shoulder: Secondary | ICD-10-CM | POA: Diagnosis not present

## 2020-04-11 DIAGNOSIS — M1612 Unilateral primary osteoarthritis, left hip: Secondary | ICD-10-CM | POA: Diagnosis not present

## 2020-05-03 DIAGNOSIS — G4733 Obstructive sleep apnea (adult) (pediatric): Secondary | ICD-10-CM | POA: Diagnosis not present

## 2020-05-10 DIAGNOSIS — M1612 Unilateral primary osteoarthritis, left hip: Secondary | ICD-10-CM | POA: Diagnosis not present

## 2020-05-10 DIAGNOSIS — M25552 Pain in left hip: Secondary | ICD-10-CM | POA: Diagnosis not present

## 2020-05-10 DIAGNOSIS — F4323 Adjustment disorder with mixed anxiety and depressed mood: Secondary | ICD-10-CM | POA: Diagnosis not present

## 2020-05-17 DIAGNOSIS — M25511 Pain in right shoulder: Secondary | ICD-10-CM | POA: Diagnosis not present

## 2020-05-17 DIAGNOSIS — M25552 Pain in left hip: Secondary | ICD-10-CM | POA: Diagnosis not present

## 2020-05-20 ENCOUNTER — Other Ambulatory Visit: Payer: Self-pay | Admitting: Family Medicine

## 2020-05-20 DIAGNOSIS — M25552 Pain in left hip: Secondary | ICD-10-CM

## 2020-05-23 ENCOUNTER — Ambulatory Visit
Admission: RE | Admit: 2020-05-23 | Discharge: 2020-05-23 | Disposition: A | Discharge status: Home / Self Care | Payer: BC Managed Care – PPO | Source: Ambulatory Visit | Attending: Family Medicine | Admitting: Family Medicine

## 2020-05-23 ENCOUNTER — Other Ambulatory Visit: Payer: Self-pay

## 2020-05-23 DIAGNOSIS — M24852 Other specific joint derangements of left hip, not elsewhere classified: Secondary | ICD-10-CM | POA: Diagnosis not present

## 2020-05-23 DIAGNOSIS — S76012A Strain of muscle, fascia and tendon of left hip, initial encounter: Secondary | ICD-10-CM | POA: Diagnosis not present

## 2020-05-23 DIAGNOSIS — M1612 Unilateral primary osteoarthritis, left hip: Secondary | ICD-10-CM | POA: Diagnosis not present

## 2020-05-23 DIAGNOSIS — S73192A Other sprain of left hip, initial encounter: Secondary | ICD-10-CM | POA: Diagnosis not present

## 2020-05-23 DIAGNOSIS — M25552 Pain in left hip: Secondary | ICD-10-CM

## 2020-05-31 DIAGNOSIS — M25552 Pain in left hip: Secondary | ICD-10-CM | POA: Diagnosis not present

## 2020-05-31 DIAGNOSIS — M25511 Pain in right shoulder: Secondary | ICD-10-CM | POA: Diagnosis not present

## 2020-06-08 ENCOUNTER — Ambulatory Visit: Payer: BC Managed Care – PPO | Admitting: Orthopaedic Surgery

## 2020-06-08 ENCOUNTER — Ambulatory Visit: Payer: Self-pay

## 2020-06-08 VITALS — Ht 63.0 in | Wt 226.0 lb

## 2020-06-08 DIAGNOSIS — M25552 Pain in left hip: Secondary | ICD-10-CM | POA: Diagnosis not present

## 2020-06-08 DIAGNOSIS — M1612 Unilateral primary osteoarthritis, left hip: Secondary | ICD-10-CM | POA: Insufficient documentation

## 2020-06-08 NOTE — Progress Notes (Signed)
Office Visit Note   Patient: Jordan Vang           Date of Birth: 02-12-1966           MRN: [id number] Visit Date: 06/08/2020              Requested by: Leeroy Cha, MD [address] [place],  [place] [address] PCP: Leeroy Cha, MD   Assessment & Plan: Visit Diagnoses:  1. Pain in left hip   2. Unilateral primary osteoarthritis, left hip     Plan: I did have a long and thorough discussion with her about hip replacement surgery. We discussed the risk and benefits of the surgery. We talked about the interoperative and postoperative course and about the replacement in general. I discussed what to expect at all aspects of care for hip replacement surgery. All questions and concerns were answered and addressed. She is interested in having this scheduled in the near future. We will be in touch about scheduling it. She will continue to work on weight loss as well. Her BMI is 40 and I am comfortable with her scheduling the surgery now because by the time we have on the schedule she will be less than forty given the fact that she is not heavy or closed today and this is probably contributing to her BMI being just over forty.  Follow-Up Instructions: Return for 2 weeks post-op.   Orders:  Orders Placed This Encounter  Procedures  . XR HIP UNILAT W OR W/O PELVIS 1V LEFT   No orders of the defined types were placed in this encounter.     Procedures: No procedures performed   Clinical Data: No additional findings.   Subjective: Chief Complaint  Patient presents with  . Left Hip - Pain  The patient is a very pleasant 55 year old female who comes in for evaluation treatment of known left hip osteoarthritis. This is been verified with plain films as well as the MRI. She has had an intra-articular steroid injection in that left hip under ultrasound and this did dissipate her pain for short amount of time though. Her left hip pain is daily. At this point  is definitely affecting her mobility, her quality of life and her actives daily living. She walks with a limp and does report a leg length discrepancy with that left side shorter than the right. She denies any right hip pain. She is not a diabetic. Her husband is with her today. This is been getting worse for the last several months and she is at the point of wanting to consider hip replacement surgery.  HPI  Review of Systems She currently denies a headache, chest pain, shortness of breath, fever, chills, nausea, vomiting  Objective: Vital Signs: Ht 5\' 3"  (1.6 m)   Wt 226 lb (102.5 kg)   LMP 08/27/2016   BMI 40.03 kg/m   Physical Exam She is alert and orient x3 and in no acute distress Ortho Exam Examination of her right hip is normal with fluid and full range of motion. Examination of her left painful hip is very stiff. She has pain with internal and external rotation as well and pain in the groin. When I have her lay supine there is a leg length discrepancy with her left lower extremity shorter on the left than the right. Specialty Comments:  No specialty comments available.  Imaging: XR HIP UNILAT W OR W/O PELVIS 1V LEFT  Result Date: 06/08/2020 An AP pelvis and  lateral left hip show severe end-stage arthritis of the left hip. There is complete loss of superior lateral joint space. There are sclerotic changes in the rim of the acetabulum and femoral head as well as some cystic changes.  The MRI of her left hip shows areas of full-thickness cartilage loss throughout the hip joint on the left side.  PMFS History: Patient Active Problem List   Diagnosis Date Noted  . Unilateral primary osteoarthritis, left hip 06/08/2020  . B12 deficiency 11/22/2015  . Fatigue 08/16/2015  . Abnormal thyroid blood test 08/02/2014  . Basal cell carcinoma, lip 10/28/2013  . Obesity (BMI 30-39.9) 10/28/2013  . Elevated cholesterol   . PMDD (premenstrual dysphoric disorder)    Past Medical  History:  Diagnosis Date  . Elevated cholesterol   . PMDD (premenstrual dysphoric disorder)    Dr Phineas Real  . Sleep apnea    CPAP  . Thyroid disease     Family History  Problem Relation Age of Onset  . Breast cancer Mother        Age 51  . Lung cancer Mother        smoker  . Hyperlipidemia Father   . Stroke Father 70  . Heart attack Maternal Grandfather        < 55 ?  . Diabetes Neg Hx   . Colon cancer Neg Hx     Past Surgical History:  Procedure Laterality Date  . BASAL CELL CARCINOMA EXCISION     Mohs  . KNEE SURGERY Right 2015  . TONSILLECTOMY     Social History   Occupational History  . Not on file  Tobacco Use  . Smoking status: Never Smoker  . Smokeless tobacco: Never Used  Vaping Use  . Vaping Use: Never used  Substance and Sexual Activity  . Alcohol use: Yes    Alcohol/week: 0.0 standard drinks    Comment: socially  . Drug use: No  . Sexual activity: Yes    Birth control/protection: Post-menopausal    Comment: intercourse age 53, sexual partners less than 5

## 2020-06-10 ENCOUNTER — Other Ambulatory Visit: Payer: BC Managed Care – PPO

## 2020-06-21 DIAGNOSIS — M25552 Pain in left hip: Secondary | ICD-10-CM | POA: Diagnosis not present

## 2020-06-21 DIAGNOSIS — M25511 Pain in right shoulder: Secondary | ICD-10-CM | POA: Diagnosis not present

## 2020-07-12 DIAGNOSIS — M25552 Pain in left hip: Secondary | ICD-10-CM | POA: Diagnosis not present

## 2020-07-12 DIAGNOSIS — M25511 Pain in right shoulder: Secondary | ICD-10-CM | POA: Diagnosis not present

## 2020-07-19 DIAGNOSIS — Z86018 Personal history of other benign neoplasm: Secondary | ICD-10-CM | POA: Diagnosis not present

## 2020-07-19 DIAGNOSIS — L821 Other seborrheic keratosis: Secondary | ICD-10-CM | POA: Diagnosis not present

## 2020-07-19 DIAGNOSIS — Z85828 Personal history of other malignant neoplasm of skin: Secondary | ICD-10-CM | POA: Diagnosis not present

## 2020-07-19 DIAGNOSIS — L814 Other melanin hyperpigmentation: Secondary | ICD-10-CM | POA: Diagnosis not present

## 2020-07-21 ENCOUNTER — Other Ambulatory Visit: Payer: Self-pay | Admitting: Physician Assistant

## 2020-07-21 ENCOUNTER — Other Ambulatory Visit: Payer: Self-pay

## 2020-07-29 ENCOUNTER — Encounter (HOSPITAL_COMMUNITY): Payer: Self-pay

## 2020-07-29 ENCOUNTER — Encounter (HOSPITAL_COMMUNITY)
Admission: RE | Admit: 2020-07-29 | Discharge: 2020-07-29 | Disposition: A | Discharge status: Home / Self Care | Payer: BC Managed Care – PPO | Source: Ambulatory Visit | Attending: Orthopaedic Surgery | Admitting: Orthopaedic Surgery

## 2020-07-29 ENCOUNTER — Other Ambulatory Visit (HOSPITAL_COMMUNITY): Payer: BC Managed Care – PPO

## 2020-07-29 ENCOUNTER — Other Ambulatory Visit: Payer: Self-pay

## 2020-07-29 DIAGNOSIS — Z01812 Encounter for preprocedural laboratory examination: Secondary | ICD-10-CM | POA: Diagnosis not present

## 2020-07-29 DIAGNOSIS — E039 Hypothyroidism, unspecified: Secondary | ICD-10-CM | POA: Diagnosis not present

## 2020-07-29 DIAGNOSIS — Z85828 Personal history of other malignant neoplasm of skin: Secondary | ICD-10-CM | POA: Insufficient documentation

## 2020-07-29 DIAGNOSIS — M1612 Unilateral primary osteoarthritis, left hip: Secondary | ICD-10-CM | POA: Diagnosis not present

## 2020-07-29 DIAGNOSIS — G4733 Obstructive sleep apnea (adult) (pediatric): Secondary | ICD-10-CM | POA: Diagnosis not present

## 2020-07-29 DIAGNOSIS — Z79899 Other long term (current) drug therapy: Secondary | ICD-10-CM | POA: Insufficient documentation

## 2020-07-29 HISTORY — DX: Anemia, unspecified: D64.9

## 2020-07-29 HISTORY — DX: Hypothyroidism, unspecified: E03.9

## 2020-07-29 LAB — COMPREHENSIVE METABOLIC PANEL
ALT: 28 U/L (ref 0–44)
AST: 19 U/L (ref 15–41)
Albumin: 4.2 g/dL (ref 3.5–5.0)
Alkaline Phosphatase: 58 U/L (ref 38–126)
Anion gap: 7 (ref 5–15)
BUN: 16 mg/dL (ref 6–20)
CO2: 26 mmol/L (ref 22–32)
Calcium: 9.5 mg/dL (ref 8.9–10.3)
Chloride: 108 mmol/L (ref 98–111)
Creatinine, Ser: 0.79 mg/dL (ref 0.44–1.00)
GFR, Estimated: >60 mL/min (ref >60)
Glucose, Bld: 98 mg/dL (ref 70–99)
Potassium: 4 mmol/L (ref 3.5–5.1)
Sodium: 141 mmol/L (ref 135–145)
Total Bilirubin: 0.4 mg/dL (ref 0.3–1.2)
Total Protein: 7 g/dL (ref 6.5–8.1)

## 2020-07-29 LAB — TYPE AND SCREEN
ABO/RH(D): A NEG
Antibody Screen: NEGATIVE

## 2020-07-29 LAB — CBC
HCT: 38.6 % (ref 36.0–46.0)
Hemoglobin: 12.5 g/dL (ref 12.0–15.0)
MCH: 27.2 pg (ref 26.0–34.0)
MCHC: 32.4 g/dL (ref 30.0–36.0)
MCV: 83.9 fL (ref 80.0–100.0)
Platelets: 296 10*3/uL (ref 150–400)
RBC: 4.6 MIL/uL (ref 3.87–5.11)
RDW: 13.5 % (ref 11.5–15.5)
WBC: 9.7 10*3/uL (ref 4.0–10.5)
nRBC: 0 % (ref 0.0–0.2)

## 2020-07-29 LAB — SURGICAL PCR SCREEN
MRSA, PCR: NEGATIVE
Staphylococcus aureus: POSITIVE — AB

## 2020-07-29 NOTE — Progress Notes (Signed)
Pt stated she takes Advil daily. Verbal order received from Myra Gianotti, PA-C for CMP.

## 2020-07-29 NOTE — Progress Notes (Signed)
PCP - Leeroy Cha, MD Cardiologist - Denies  PPM/ICD - Denies  Chest x-ray - N/A EKG - 02/01/20 Stress Test - Denies ECHO - Denies Cardiac Cath - Denies  Sleep Study - Yes CPAP - Yes  Patient denies being diabetic.  Blood Thinner Instructions: N/A Aspirin Instructions: N/A  ERAS Protcol - Yes PRE-SURGERY Ensure or G2- Ensure given  COVID TEST- 07/29/20; results pending.   Anesthesia review: No  Patient denies shortness of breath, fever, cough and chest pain at PAT appointment   All instructions explained to the patient, with a verbal understanding of the material. Patient agrees to go over the instructions while at home for a better understanding. Patient also instructed to self quarantine after being tested for COVID-19. The opportunity to ask questions was provided.

## 2020-07-29 NOTE — Pre-Procedure Instructions (Addendum)
Surgical Instructions:    Your procedure is scheduled on Tuesday 08/02/20 (12 PM- 1:39 PM).  Report to [facility] Main Entrance "A" at 10:00 A.M., then check in with the Admitting office.  Call this number if you have any questions prior to, or have any problems the morning of surgery:  [phone]    Remember:  Do not eat after midnight the night before your surgery.  You may drink clear liquids until 09:00 AM the morning of your surgery.   Clear liquids allowed are: Water, Non-Citrus Juices (without pulp), Carbonated Beverages, Clear Tea, Black Coffee Only, and Gatorade.  . The day of surgery (if you do NOT have diabetes):  o Drink ONE (1) Pre-Surgery Clear Ensure by 09:00 AM the morning of surgery.   o This drink was given to you during your hospital pre-op appointment visit. o Nothing else to drink after completing the Pre-Surgery Clear Ensure.         If you have questions, please contact your surgeon's office.     Take these medicines the morning of surgery with A SIP OF WATER: fexofenadine (ALLEGRA) levothyroxine (SYNTHROID, LEVOTHROID) methocarbamol (ROBAXIN) sertraline (ZOLOFT)  IF NEEDED: acetaminophen (TYLENOL)  Artificial Tear eye drops carboxymethylcellulose (REFRESH TEARS) eye drops ketotifen (ALAWAY) eye drops fluticasone (FLONASE) nasal spray    As of today, STOP taking any Aspirin (unless otherwise instructed by your surgeon) Aleve, Naproxen, Ibuprofen, Motrin, Advil, Goody's, BC's, all herbal medications, fish oil, and all vitamins.              Special instructions:   [place]- Preparing For Surgery  Before surgery, you can play an important role. Because skin is not sterile, your skin needs to be as free of germs as possible. You can reduce the number of germs on your skin by washing with CHG (chlorahexidine gluconate) Soap before surgery.  CHG is an antiseptic cleaner which kills germs and bonds with the skin to continue killing germs even after  washing.    Oral Hygiene is also important to reduce your risk of infection.  Remember - BRUSH YOUR TEETH THE MORNING OF SURGERY WITH YOUR REGULAR TOOTHPASTE  Please do not use if you have an allergy to CHG or antibacterial soaps. If your skin becomes reddened/irritated stop using the CHG.  Do not shave (including legs and underarms) for at least 48 hours prior to first CHG shower. It is OK to shave your face.  Please follow these instructions carefully.   1. Shower the NIGHT BEFORE SURGERY and the MORNING OF SURGERY  2. If you chose to wash your hair, wash your hair first as usual with your normal shampoo.  3. After you shampoo, rinse your hair and body thoroughly to remove the shampoo.  4. Wash Face and genitals (private parts) with your normal soap.   5. Use CHG Soap as you would any other liquid soap. You can apply CHG directly to the skin and wash gently with a scrungie or a clean washcloth.   6. Apply the CHG Soap to your body ONLY FROM THE NECK DOWN.  Do not use on open wounds or open sores. Avoid contact with your eyes, ears, mouth and genitals (private parts). Wash Face and genitals (private parts)  with your normal soap.   7. Wash thoroughly, paying special attention to the area where your surgery will be performed.  8. Thoroughly rinse your body with warm water from the neck down.  9. DO NOT shower/wash with your normal soap  after using and rinsing off the CHG Soap.  10. Pat yourself dry with a CLEAN TOWEL.  11. Wear CLEAN PAJAMAS to bed the night before surgery.  12. Place CLEAN SHEETS on your bed the night before your surgery.  13. DO NOT SLEEP WITH PETS.   Day of Surgery: SHOWER with CHG soap. Brush your teeth WITH YOUR REGULAR TOOTHPASTE. Wear Clean/Comfortable clothing the morning of surgery. Do not apply any deodorants/lotions.   Do not wear jewelry, make up, or nail polish. Do not shave 48 hours prior to surgery.    Do NOT Smoke (Tobacco/Vaping) or drink  Alcohol 24 hours prior to your procedure. Do not bring valuables to the hospital. [facility] is not responsible for any belongings or valuables.  If you use a CPAP at night, you may bring all equipment for your overnight stay.   Contacts, glasses, or dentures may not be worn into surgery, please bring cases for these belongings.   For patients admitted to the hospital, discharge time will be determined by your treatment team.   Patients discharged the day of surgery will not be allowed to drive home, and someone needs to stay with them for 24 hours.    Please read over the following fact sheets that you were given.

## 2020-08-01 NOTE — Progress Notes (Signed)
Anesthesia Chart Review:  Case: [id number] Date/Time: 08/02/20 1145   Procedure: LEFT TOTAL HIP ARTHROPLASTY ANTERIOR APPROACH (Left Hip)   Anesthesia type: Spinal   Pre-op diagnosis: osteoarthritis left hip   Location: [place] OR ROOM 05 / [place] OR   Surgeons: Mcarthur Rossetti, MD      DISCUSSION: Patient is a 55 year old female scheduled for the above procedure.   History includes never smoker, hypercholesterolemia, OSA (uses CPAP), hypothyroidism, anemia, skin cancer (s/p BCC excision/Mohs Procedure). BMI is consistent with obesity.   She denied chest pain, SOB, cough, fever at PAT RN visit.  07/29/20 presurgical COVID-19 has not yet resulted--I contacted [facility] who could not locate a specimen/not ever marked as received in Main Lab and no specimen tube found in PAT Lab. RN Flow Coordinator to be notified and determine timing of repeat COVID-19 testing. Patient is not currently listed a first case.  Anesthesia team to evaluate on the day of surgery.    VS: BP 101/81   Pulse 86   Temp 37.1 C (Oral)   Resp 20   Ht 5\' 3"  (1.6 m)   Wt 102.1 kg   LMP 08/27/2016   SpO2 100%   BMI 39.86 kg/m     PROVIDERS: Leeroy Cha, MD is PCP    LABS: Labs reviewed: Acceptable for surgery. CMET ordered with PAT labs since she had been taking daily Tylenol and ibuprofen (at PAT advised to hold ibuprofen prior to surgery). See DISCUSSION. (all labs ordered are listed, but only abnormal results are displayed)  Labs Reviewed  SURGICAL PCR SCREEN - Abnormal; Notable for the following components:      Result Value   Staphylococcus aureus POSITIVE (*)    All other components within normal limits  SARS CORONAVIRUS 2 (TAT 6-24 HRS)  CBC  COMPREHENSIVE METABOLIC PANEL  TYPE AND SCREEN    IMAGES: MRI Left Hip 05/23/20: IMPRESSION: 1. Moderate right hip joint degenerative changes and advanced left hip joint degenerative changes. 2. No findings for stress fracture or AVN. 3.  Degenerated and torn left hip labrum. 4. 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  CXR 01/29/20: FINDINGS: Accounting for body habitus, the lungs are clear. No consolidation, features of edema, pneumothorax, or effusion. Pulmonary vascularity is normally distributed. The cardiomediastinal contours are unremarkable. No acute osseous or soft tissue abnormality. IMPRESSION: No acute cardiopulmonary abnormality.   EKG: 01/29/20: Sinus rhythm Left anterior fascicular block Abnormal R-wave progression, early transition - LAFB new when compared to 10/28/13 tracing. Reverse r wave progression V2-3, consider lead placement/reversal.   CV: N/A   Past Medical History:  Diagnosis Date  . Anemia   . Elevated cholesterol   . Hypothyroidism   . PMDD (premenstrual dysphoric disorder)    Dr Phineas Real  . Sleep apnea    CPAP  . Thyroid disease     Past Surgical History:  Procedure Laterality Date  . BASAL CELL CARCINOMA EXCISION     Mohs  . KNEE SURGERY Right 2015  . TONSILLECTOMY      MEDICATIONS: . acetaminophen (TYLENOL) 500 MG tablet  . Artificial Tear Solution (SOOTHE XP OP)  . atorvastatin (LIPITOR) 10 MG tablet  . Calcium Carb-Cholecalciferol (CALCIUM 600 + D PO)  . carboxymethylcellulose (REFRESH TEARS) 0.5 % SOLN  . cholecalciferol (VITAMIN D3) 25 MCG (1000 UNIT) tablet  . COLLAGEN PO  . fexofenadine (ALLEGRA) 180 MG tablet  . fluticasone (FLONASE) 50 MCG/ACT nasal spray  . ibuprofen (ADVIL,MOTRIN) 200 MG tablet  . ketotifen (  ALAWAY) 0.025 % ophthalmic solution  . levothyroxine (SYNTHROID, LEVOTHROID) 50 MCG tablet  . Liniments (DEEP BLUE RELIEF EX)  . MELATONIN PO  . methocarbamol (ROBAXIN) 500 MG tablet  . Multiple Vitamin (MULTIVITAMIN WITH MINERALS) TABS tablet  . Omega-3 Fatty Acids (FISH OIL) 1000 MG CAPS  . sertraline (ZOLOFT) 25 MG tablet  . triamcinolone (KENALOG) 0.1 %  . Turmeric Curcumin 500 MG CAPS   No current facility-administered  medications for this encounter.    Myra Gianotti, PA-C Surgical Short Stay/Anesthesiology [facility] Phone [phone] [facility] Phone [phone] 08/01/2020 10:00 AM

## 2020-08-01 NOTE — Anesthesia Preprocedure Evaluation (Addendum)
Anesthesia Evaluation  Patient identified by MRN, date of birth, ID band Patient awake    Reviewed: Allergy & Precautions, NPO status , Patient's Chart, lab work & pertinent test results  Airway Mallampati: II  TM Distance: >3 FB Neck ROM: Full    Dental no notable dental hx. (+) Teeth Intact, Dental Advisory Given   Pulmonary sleep apnea and Continuous Positive Airway Pressure Ventilation ,    Pulmonary exam normal breath sounds clear to auscultation       Cardiovascular negative cardio ROS Normal cardiovascular exam Rhythm:Regular Rate:Normal     Neuro/Psych negative neurological ROS  negative psych ROS   GI/Hepatic negative GI ROS, Neg liver ROS,   Endo/Other  Hypothyroidism Morbid obesity  Renal/GU negative Renal ROS  negative genitourinary   Musculoskeletal  (+) Arthritis , Osteoarthritis,    Abdominal   Peds negative pediatric ROS (+)  Hematology negative hematology ROS (+)   Anesthesia Other Findings   Reproductive/Obstetrics negative OB ROS                            Anesthesia Physical Anesthesia Plan  ASA: III  Anesthesia Plan: Spinal   Post-op Pain Management:    Induction: Intravenous  PONV Risk Score and Plan: 2 and Ondansetron, Dexamethasone and Treatment may vary due to age or medical condition  Airway Management Planned: Simple Face Mask  Additional Equipment:   Intra-op Plan:   Post-operative Plan:   Informed Consent: I have reviewed the patients History and Physical, chart, labs and discussed the procedure including the risks, benefits and alternatives for the proposed anesthesia with the patient or authorized representative who has indicated his/her understanding and acceptance.     Dental advisory given  Plan Discussed with: CRNA and Surgeon  Anesthesia Plan Comments: (PAT note written 08/01/2020 by Myra Gianotti, PA-C. )       Anesthesia  Quick Evaluation

## 2020-08-01 NOTE — Progress Notes (Signed)
Patient will need repeat covid test DOS

## 2020-08-01 NOTE — Progress Notes (Signed)
Pt will need to be re-covid tested. Specimen collected 07/29/20 not resulted. Pt instructed to arrive DOS @ 0900 to have covid test collected on arrival. Jovita Kussmaul, RN  Coordinator made aware

## 2020-08-02 ENCOUNTER — Ambulatory Visit (HOSPITAL_COMMUNITY): Payer: BC Managed Care – PPO

## 2020-08-02 ENCOUNTER — Observation Stay (HOSPITAL_COMMUNITY)
Admission: RE | Admit: 2020-08-02 | Discharge: 2020-08-03 | Disposition: A | Discharge status: Home / Self Care | Payer: BC Managed Care – PPO | Attending: Orthopaedic Surgery | Admitting: Orthopaedic Surgery

## 2020-08-02 ENCOUNTER — Observation Stay (HOSPITAL_COMMUNITY): Payer: BC Managed Care – PPO

## 2020-08-02 ENCOUNTER — Ambulatory Visit (HOSPITAL_COMMUNITY): Payer: BC Managed Care – PPO | Admitting: Vascular Surgery

## 2020-08-02 ENCOUNTER — Encounter (HOSPITAL_COMMUNITY): Payer: Self-pay | Admitting: Orthopaedic Surgery

## 2020-08-02 ENCOUNTER — Encounter (HOSPITAL_COMMUNITY)
Admission: RE | Disposition: A | Discharge status: Home / Self Care | Payer: Self-pay | Source: Home / Self Care | Attending: Orthopaedic Surgery

## 2020-08-02 DIAGNOSIS — Z419 Encounter for procedure for purposes other than remedying health state, unspecified: Secondary | ICD-10-CM

## 2020-08-02 DIAGNOSIS — Z9989 Dependence on other enabling machines and devices: Secondary | ICD-10-CM | POA: Diagnosis not present

## 2020-08-02 DIAGNOSIS — Z471 Aftercare following joint replacement surgery: Secondary | ICD-10-CM | POA: Diagnosis not present

## 2020-08-02 DIAGNOSIS — Z96642 Presence of left artificial hip joint: Secondary | ICD-10-CM

## 2020-08-02 DIAGNOSIS — G4730 Sleep apnea, unspecified: Secondary | ICD-10-CM | POA: Diagnosis not present

## 2020-08-02 DIAGNOSIS — M1612 Unilateral primary osteoarthritis, left hip: Secondary | ICD-10-CM | POA: Diagnosis not present

## 2020-08-02 DIAGNOSIS — Z85828 Personal history of other malignant neoplasm of skin: Secondary | ICD-10-CM | POA: Insufficient documentation

## 2020-08-02 DIAGNOSIS — E039 Hypothyroidism, unspecified: Secondary | ICD-10-CM | POA: Diagnosis not present

## 2020-08-02 DIAGNOSIS — Z20822 Contact with and (suspected) exposure to covid-19: Secondary | ICD-10-CM | POA: Diagnosis not present

## 2020-08-02 DIAGNOSIS — Z79899 Other long term (current) drug therapy: Secondary | ICD-10-CM | POA: Diagnosis not present

## 2020-08-02 HISTORY — PX: TOTAL HIP ARTHROPLASTY: SHX124

## 2020-08-02 LAB — SARS CORONAVIRUS 2 BY RT PCR (HOSPITAL ORDER, PERFORMED IN ~~LOC~~ HOSPITAL LAB): SARS Coronavirus 2: NEGATIVE

## 2020-08-02 LAB — ABO/RH: ABO/RH(D): A NEG

## 2020-08-02 SURGERY — ARTHROPLASTY, HIP, TOTAL, ANTERIOR APPROACH
Anesthesia: Spinal | Site: Hip | Laterality: Left

## 2020-08-02 MED ORDER — PHENYLEPHRINE HCL-NACL 10-0.9 MG/250ML-% IV SOLN
INTRAVENOUS | Status: DC | PRN
  Administered 2020-08-02: 50 ug/min via INTRAVENOUS

## 2020-08-02 MED ORDER — ORAL CARE MOUTH RINSE: 15.0000 mL | Freq: Once | OROMUCOSAL | Status: AC

## 2020-08-02 MED ORDER — MIDAZOLAM HCL 5 MG/5ML IJ SOLN
INTRAMUSCULAR | Status: DC | PRN
  Administered 2020-08-02: 2 mg via INTRAVENOUS

## 2020-08-02 MED ORDER — HYDROMORPHONE HCL 1 MG/ML IJ SOLN: 0.2500 mg | INTRAMUSCULAR | Status: DC | PRN

## 2020-08-02 MED ORDER — VITAMIN D 25 MCG (1000 UNIT) PO TABS
1000.0000 [IU] | ORAL_TABLET | Freq: Every day | ORAL | Status: DC
  Administered 2020-08-02 – 2020-08-03 (×2): 1000 [IU] via ORAL
  Filled 2020-08-02 (×2): qty 1

## 2020-08-02 MED ORDER — MENTHOL 3 MG MT LOZG: 1.0000 | LOZENGE | OROMUCOSAL | Status: DC | PRN

## 2020-08-02 MED ORDER — ASPIRIN 81 MG PO CHEW
81.0000 mg | CHEWABLE_TABLET | Freq: Two times a day (BID) | ORAL | Status: DC
  Administered 2020-08-02 – 2020-08-03 (×2): 81 mg via ORAL
  Filled 2020-08-02 (×2): qty 1

## 2020-08-02 MED ORDER — CEFAZOLIN SODIUM-DEXTROSE 1-4 GM/50ML-% IV SOLN
1.0000 g | Freq: Four times a day (QID) | INTRAVENOUS | Status: AC
  Administered 2020-08-02 – 2020-08-03 (×2): 1 g via INTRAVENOUS
  Filled 2020-08-02 (×2): qty 50

## 2020-08-02 MED ORDER — POVIDONE-IODINE 10 % EX SWAB: 2.0000 "application " | Freq: Once | CUTANEOUS | Status: DC

## 2020-08-02 MED ORDER — METOCLOPRAMIDE HCL 5 MG PO TABS: 5.0000 mg | ORAL_TABLET | Freq: Three times a day (TID) | ORAL | Status: DC | PRN

## 2020-08-02 MED ORDER — OXYCODONE HCL 5 MG PO TABS
ORAL_TABLET | ORAL | Status: AC
  Filled 2020-08-02: qty 1

## 2020-08-02 MED ORDER — FENTANYL CITRATE (PF) 250 MCG/5ML IJ SOLN
INTRAMUSCULAR | Status: AC
  Filled 2020-08-02: qty 5

## 2020-08-02 MED ORDER — PROPOFOL 500 MG/50ML IV EMUL
INTRAVENOUS | Status: DC | PRN
  Administered 2020-08-02: 75 ug/kg/min via INTRAVENOUS

## 2020-08-02 MED ORDER — LIDOCAINE 2% (20 MG/ML) 5 ML SYRINGE
INTRAMUSCULAR | Status: AC
  Filled 2020-08-02: qty 5

## 2020-08-02 MED ORDER — PANTOPRAZOLE SODIUM 40 MG PO TBEC
40.0000 mg | DELAYED_RELEASE_TABLET | Freq: Every day | ORAL | Status: DC
  Administered 2020-08-02 – 2020-08-03 (×2): 40 mg via ORAL
  Filled 2020-08-02 (×2): qty 1

## 2020-08-02 MED ORDER — SODIUM CHLORIDE 0.9 % IV SOLN: INTRAVENOUS | Status: DC

## 2020-08-02 MED ORDER — LEVOTHYROXINE SODIUM 25 MCG PO TABS
50.0000 ug | ORAL_TABLET | Freq: Every day | ORAL | Status: DC
  Administered 2020-08-03: 50 ug via ORAL
  Filled 2020-08-02: qty 2

## 2020-08-02 MED ORDER — ONDANSETRON HCL 4 MG/2ML IJ SOLN: 4.0000 mg | Freq: Once | INTRAMUSCULAR | Status: DC | PRN

## 2020-08-02 MED ORDER — ACETAMINOPHEN 10 MG/ML IV SOLN: 1000.0000 mg | Freq: Once | INTRAVENOUS | Status: DC | PRN

## 2020-08-02 MED ORDER — OXYCODONE HCL 5 MG PO TABS
5.0000 mg | ORAL_TABLET | Freq: Once | ORAL | Status: AC | PRN
  Administered 2020-08-02: 5 mg via ORAL

## 2020-08-02 MED ORDER — ALUM & MAG HYDROXIDE-SIMETH 200-200-20 MG/5ML PO SUSP: 30.0000 mL | ORAL | Status: DC | PRN

## 2020-08-02 MED ORDER — OXYCODONE HCL 5 MG PO TABS
5.0000 mg | ORAL_TABLET | ORAL | Status: DC | PRN
  Administered 2020-08-02: 10 mg via ORAL
  Filled 2020-08-02: qty 2

## 2020-08-02 MED ORDER — ONDANSETRON HCL 4 MG/2ML IJ SOLN
INTRAMUSCULAR | Status: DC | PRN
  Administered 2020-08-02: 4 mg via INTRAVENOUS

## 2020-08-02 MED ORDER — LACTATED RINGERS IV SOLN: INTRAVENOUS | Status: DC

## 2020-08-02 MED ORDER — METHOCARBAMOL 1000 MG/10ML IJ SOLN
500.0000 mg | Freq: Four times a day (QID) | INTRAMUSCULAR | Status: DC | PRN
  Filled 2020-08-02: qty 5

## 2020-08-02 MED ORDER — PHENOL 1.4 % MT LIQD: 1.0000 | OROMUCOSAL | Status: DC | PRN

## 2020-08-02 MED ORDER — 0.9 % SODIUM CHLORIDE (POUR BTL) OPTIME
TOPICAL | Status: DC | PRN
  Administered 2020-08-02: 1000 mL

## 2020-08-02 MED ORDER — ATORVASTATIN CALCIUM 10 MG PO TABS
10.0000 mg | ORAL_TABLET | Freq: Every day | ORAL | Status: DC
  Administered 2020-08-02: 10 mg via ORAL
  Filled 2020-08-02: qty 1

## 2020-08-02 MED ORDER — MIDAZOLAM HCL 2 MG/2ML IJ SOLN
INTRAMUSCULAR | Status: AC
  Filled 2020-08-02: qty 2

## 2020-08-02 MED ORDER — HYDROMORPHONE HCL 1 MG/ML IJ SOLN
0.5000 mg | INTRAMUSCULAR | Status: DC | PRN
  Administered 2020-08-02 (×2): 1 mg via INTRAVENOUS
  Filled 2020-08-02 (×2): qty 1

## 2020-08-02 MED ORDER — ACETAMINOPHEN 325 MG PO TABS
325.0000 mg | ORAL_TABLET | Freq: Four times a day (QID) | ORAL | Status: DC | PRN
Start: 2020-08-03 — End: 2020-08-03

## 2020-08-02 MED ORDER — SERTRALINE HCL 50 MG PO TABS
25.0000 mg | ORAL_TABLET | Freq: Every day | ORAL | Status: DC
  Administered 2020-08-03: 25 mg via ORAL
  Filled 2020-08-02: qty 1

## 2020-08-02 MED ORDER — ONDANSETRON HCL 4 MG/2ML IJ SOLN
4.0000 mg | Freq: Four times a day (QID) | INTRAMUSCULAR | Status: DC | PRN
  Administered 2020-08-02: 4 mg via INTRAVENOUS
  Filled 2020-08-02: qty 2

## 2020-08-02 MED ORDER — METHOCARBAMOL 500 MG PO TABS
500.0000 mg | ORAL_TABLET | Freq: Four times a day (QID) | ORAL | Status: DC | PRN
  Administered 2020-08-02 – 2020-08-03 (×2): 500 mg via ORAL
  Filled 2020-08-02 (×3): qty 1

## 2020-08-02 MED ORDER — DIPHENHYDRAMINE HCL 12.5 MG/5ML PO ELIX
12.5000 mg | ORAL_SOLUTION | ORAL | Status: DC | PRN
  Filled 2020-08-02: qty 10

## 2020-08-02 MED ORDER — OXYCODONE HCL 5 MG PO TABS
10.0000 mg | ORAL_TABLET | ORAL | Status: DC | PRN
Start: 2020-08-02 — End: 2020-08-03
  Administered 2020-08-02 – 2020-08-03 (×4): 15 mg via ORAL
  Filled 2020-08-02 (×4): qty 3

## 2020-08-02 MED ORDER — METOCLOPRAMIDE HCL 5 MG/ML IJ SOLN: 5.0000 mg | Freq: Three times a day (TID) | INTRAMUSCULAR | Status: DC | PRN

## 2020-08-02 MED ORDER — ONDANSETRON HCL 4 MG PO TABS: 4.0000 mg | ORAL_TABLET | Freq: Four times a day (QID) | ORAL | Status: DC | PRN

## 2020-08-02 MED ORDER — DEXAMETHASONE SODIUM PHOSPHATE 10 MG/ML IJ SOLN
INTRAMUSCULAR | Status: AC
  Filled 2020-08-02: qty 1

## 2020-08-02 MED ORDER — TRANEXAMIC ACID-NACL 1000-0.7 MG/100ML-% IV SOLN
1000.0000 mg | INTRAVENOUS | Status: AC
  Administered 2020-08-02: 1000 mg via INTRAVENOUS
  Filled 2020-08-02: qty 100

## 2020-08-02 MED ORDER — ONDANSETRON HCL 4 MG/2ML IJ SOLN
INTRAMUSCULAR | Status: AC
  Filled 2020-08-02: qty 2

## 2020-08-02 MED ORDER — SODIUM CHLORIDE 0.9 % IR SOLN
Status: DC | PRN
  Administered 2020-08-02: 3000 mL

## 2020-08-02 MED ORDER — FENTANYL CITRATE (PF) 100 MCG/2ML IJ SOLN
INTRAMUSCULAR | Status: DC | PRN
  Administered 2020-08-02: 50 ug via INTRAVENOUS

## 2020-08-02 MED ORDER — OXYCODONE HCL 5 MG/5ML PO SOLN: 5.0000 mg | Freq: Once | ORAL | Status: AC | PRN

## 2020-08-02 MED ORDER — CHLORHEXIDINE GLUCONATE 0.12 % MT SOLN
15.0000 mL | Freq: Once | OROMUCOSAL | Status: AC
  Administered 2020-08-02: 15 mL via OROMUCOSAL
  Filled 2020-08-02: qty 15

## 2020-08-02 MED ORDER — DOCUSATE SODIUM 100 MG PO CAPS
100.0000 mg | ORAL_CAPSULE | Freq: Two times a day (BID) | ORAL | Status: DC
  Administered 2020-08-02 – 2020-08-03 (×3): 100 mg via ORAL
  Filled 2020-08-02 (×3): qty 1

## 2020-08-02 MED ORDER — PROPOFOL 10 MG/ML IV BOLUS
INTRAVENOUS | Status: AC
  Filled 2020-08-02: qty 20

## 2020-08-02 MED ORDER — CEFAZOLIN SODIUM-DEXTROSE 2-4 GM/100ML-% IV SOLN
2.0000 g | INTRAVENOUS | Status: AC
  Administered 2020-08-02: 2 g via INTRAVENOUS
  Filled 2020-08-02: qty 100

## 2020-08-02 MED ORDER — DEXAMETHASONE SODIUM PHOSPHATE 10 MG/ML IJ SOLN
INTRAMUSCULAR | Status: DC | PRN
  Administered 2020-08-02: 10 mg via INTRAVENOUS

## 2020-08-02 MED ORDER — PROPOFOL 10 MG/ML IV BOLUS
INTRAVENOUS | Status: DC | PRN
  Administered 2020-08-02: 20 mg via INTRAVENOUS
  Administered 2020-08-02: 10 mg via INTRAVENOUS

## 2020-08-02 MED ORDER — BUPIVACAINE IN DEXTROSE 0.75-8.25 % IT SOLN
INTRATHECAL | Status: DC | PRN
  Administered 2020-08-02: 1.8 mL via INTRATHECAL

## 2020-08-02 SURGICAL SUPPLY — 52 items
BENZOIN TINCTURE PRP APPL 2/3 (GAUZE/BANDAGES/DRESSINGS) ×2 IMPLANT
BLADE CLIPPER SURG (BLADE) IMPLANT
BLADE SAW SGTL 18X1.27X75 (BLADE) ×2 IMPLANT
COVER SURGICAL LIGHT HANDLE (MISCELLANEOUS) ×2 IMPLANT
COVER WAND RF STERILE (DRAPES) IMPLANT
CUP SECTOR GRIPTON 50MM (Cup) ×2 IMPLANT
DRAPE C-ARM 42X72 X-RAY (DRAPES) ×2 IMPLANT
DRAPE STERI IOBAN 125X83 (DRAPES) ×2 IMPLANT
DRAPE U-SHAPE 47X51 STRL (DRAPES) ×6 IMPLANT
DRSG AQUACEL AG ADV 3.5X10 (GAUZE/BANDAGES/DRESSINGS) ×2 IMPLANT
DURAPREP 26ML APPLICATOR (WOUND CARE) ×2 IMPLANT
ELECT BLADE 4.0 EZ CLEAN MEGAD (MISCELLANEOUS) ×2
ELECT BLADE 6.5 EXT (BLADE) IMPLANT
ELECT REM PT RETURN 9FT ADLT (ELECTROSURGICAL) ×2
ELECTRODE BLDE 4.0 EZ CLN MEGD (MISCELLANEOUS) ×1 IMPLANT
ELECTRODE REM PT RTRN 9FT ADLT (ELECTROSURGICAL) ×1 IMPLANT
FACESHIELD WRAPAROUND (MASK) ×6 IMPLANT
GLOVE ECLIPSE 8.0 STRL XLNG CF (GLOVE) ×2 IMPLANT
GLOVE ORTHO TXT STRL SZ7.5 (GLOVE) ×4 IMPLANT
GLOVE SRG 8 PF TXTR STRL LF DI (GLOVE) ×2 IMPLANT
GLOVE SURG UNDER POLY LF SZ8 (GLOVE) ×4
GOWN STRL REUS W/ TWL LRG LVL3 (GOWN DISPOSABLE) ×2 IMPLANT
GOWN STRL REUS W/ TWL XL LVL3 (GOWN DISPOSABLE) ×2 IMPLANT
GOWN STRL REUS W/TWL LRG LVL3 (GOWN DISPOSABLE) ×4
GOWN STRL REUS W/TWL XL LVL3 (GOWN DISPOSABLE) ×4
HANDPIECE INTERPULSE COAX TIP (DISPOSABLE) ×2
HEAD FEMORAL 32 CERAMIC (Hips) ×2 IMPLANT
KIT BASIN OR (CUSTOM PROCEDURE TRAY) ×2 IMPLANT
KIT TURNOVER KIT B (KITS) ×2 IMPLANT
LINER ACETABULAR 32X50 (Liner) ×2 IMPLANT
MANIFOLD NEPTUNE II (INSTRUMENTS) ×2 IMPLANT
NS IRRIG 1000ML POUR BTL (IV SOLUTION) ×2 IMPLANT
PACK TOTAL JOINT (CUSTOM PROCEDURE TRAY) ×2 IMPLANT
PAD ARMBOARD 7.5X6 YLW CONV (MISCELLANEOUS) ×2 IMPLANT
SET HNDPC FAN SPRY TIP SCT (DISPOSABLE) ×1 IMPLANT
STAPLER VISISTAT 35W (STAPLE) IMPLANT
STEM CORAIL KA10 (Stem) ×2 IMPLANT
STRIP CLOSURE SKIN 1/2X4 (GAUZE/BANDAGES/DRESSINGS) ×4 IMPLANT
SUT ETHIBOND NAB CT1 #1 30IN (SUTURE) ×4 IMPLANT
SUT MNCRL AB 4-0 PS2 18 (SUTURE) IMPLANT
SUT VIC AB 0 CT1 27 (SUTURE) ×2
SUT VIC AB 0 CT1 27XBRD ANBCTR (SUTURE) ×1 IMPLANT
SUT VIC AB 1 CT1 27 (SUTURE) ×2
SUT VIC AB 1 CT1 27XBRD ANBCTR (SUTURE) ×1 IMPLANT
SUT VIC AB 2-0 CT1 27 (SUTURE) ×2
SUT VIC AB 2-0 CT1 TAPERPNT 27 (SUTURE) ×1 IMPLANT
TOWEL GREEN STERILE (TOWEL DISPOSABLE) ×2 IMPLANT
TOWEL GREEN STERILE FF (TOWEL DISPOSABLE) ×2 IMPLANT
TRAY CATH 16FR W/PLASTIC CATH (SET/KITS/TRAYS/PACK) IMPLANT
TRAY FOLEY W/BAG SLVR 16FR (SET/KITS/TRAYS/PACK) ×2
TRAY FOLEY W/BAG SLVR 16FR ST (SET/KITS/TRAYS/PACK) ×1 IMPLANT
WATER STERILE IRR 1000ML POUR (IV SOLUTION) ×2 IMPLANT

## 2020-08-02 NOTE — Anesthesia Procedure Notes (Signed)
Spinal  Patient location during procedure: OR Start time: 08/02/2020 12:15 PM End time: 08/02/2020 12:21 PM Reason for block: surgical anesthesia Staffing Performed: anesthesiologist  Anesthesiologist: Myrtie Soman, MD Preanesthetic Checklist Completed: patient identified, IV checked, site marked, risks and benefits discussed, surgical consent, monitors and equipment checked, pre-op evaluation and timeout performed Spinal Block Patient position: sitting Prep: Betadine Patient monitoring: heart rate, continuous pulse ox and blood pressure Approach: midline Location: L3-4 Injection technique: single-shot Needle Needle type: Sprotte  Needle gauge: 24 G Needle length: 9 cm Assessment Events: CSF return Additional Notes Expiration date of kit checked and confirmed. Patient tolerated procedure well, without complications.

## 2020-08-02 NOTE — H&P (Signed)
TOTAL HIP ADMISSION H&P  Patient is admitted for left total hip arthroplasty.  Subjective:  Chief Complaint: left hip pain  HPI: Jordan Vang, 55 y.o. female, has a history of pain and functional disability in the left hip(s) due to arthritis and patient has failed non-surgical conservative treatments for greater than 12 weeks to include NSAID's and/or analgesics, corticosteriod injections, flexibility and strengthening excercises, weight reduction as appropriate and activity modification.  Onset of symptoms was gradual starting 2 years ago with gradually worsening course since that time.The patient noted no past surgery on the left hip(s).  Patient currently rates pain in the left hip at 10 out of 10 with activity. Patient has night pain, worsening of pain with activity and weight bearing, trendelenberg gait, pain that interfers with activities of daily living and pain with passive range of motion. Patient has evidence of subchondral sclerosis, periarticular osteophytes and joint space narrowing by imaging studies. This condition presents safety issues increasing the risk of falls.  There is no current active infection.  Patient Active Problem List   Diagnosis Date Noted  . Unilateral primary osteoarthritis, left hip 06/08/2020  . B12 deficiency 11/22/2015  . Fatigue 08/16/2015  . Abnormal thyroid blood test 08/02/2014  . Basal cell carcinoma, lip 10/28/2013  . Obesity (BMI 30-39.9) 10/28/2013  . Elevated cholesterol   . PMDD (premenstrual dysphoric disorder)    Past Medical History:  Diagnosis Date  . Anemia   . Elevated cholesterol   . Hypothyroidism   . PMDD (premenstrual dysphoric disorder)    Dr Phineas Real  . Sleep apnea    CPAP  . Thyroid disease     Past Surgical History:  Procedure Laterality Date  . BASAL CELL CARCINOMA EXCISION     Mohs  . KNEE SURGERY Right 2015  . TONSILLECTOMY      Current Facility-Administered Medications  Medication Dose Route Frequency Provider  Last Rate Last Admin  . ceFAZolin (ANCEF) IVPB 2g/100 mL premix  2 g Intravenous On Call to OR Pete Pelt, PA-C      . lactated ringers infusion   Intravenous Continuous Oleta Mouse, MD 10 mL/hr at 08/02/20 1058 Continued from Pre-op at 08/02/20 1058  . povidone-iodine 10 % swab 2 application  2 application Topical Once Pete Pelt, PA-C      . tranexamic acid (CYKLOKAPRON) IVPB 1,000 mg  1,000 mg Intravenous To OR Pete Pelt, PA-C       Allergies  Allergen Reactions  . Azithromycin     Flu like symptoms   . Naproxen Swelling    edema in legs/ankles  . Celebrex [Celecoxib]     Hypertension   . Tape     Redness     Social History   Tobacco Use  . Smoking status: Never Smoker  . Smokeless tobacco: Never Used  Substance Use Topics  . Alcohol use: Yes    Alcohol/week: 0.0 standard drinks    Comment: socially    Family History  Problem Relation Age of Onset  . Breast cancer Mother        Age 68  . Lung cancer Mother        smoker  . Hyperlipidemia Father   . Stroke Father 32  . Heart attack Maternal Grandfather        < 55 ?  . Diabetes Neg Hx   . Colon cancer Neg Hx      Review of Systems  Musculoskeletal: Positive for gait problem.  All  other systems reviewed and are negative.   Objective:  Physical Exam Vitals reviewed.  Constitutional:      Appearance: Normal appearance.  HENT:     Head: Normocephalic and atraumatic.  Eyes:     Extraocular Movements: Extraocular movements intact.     Pupils: Pupils are equal, round, and reactive to light.  Cardiovascular:     Rate and Rhythm: Normal rate and regular rhythm.     Pulses: Normal pulses.  Pulmonary:     Effort: Pulmonary effort is normal.     Breath sounds: Normal breath sounds.  Abdominal:     Palpations: Abdomen is soft.  Musculoskeletal:     Cervical back: Normal range of motion.     Left hip: Tenderness and bony tenderness present. Decreased range of motion. Decreased strength.   Neurological:     Mental Status: She is alert and oriented to person, place, and time.  Psychiatric:        Behavior: Behavior normal.     Vital signs in last 24 hours: Temp:  [99.1 F (37.3 C)] 99.1 F (37.3 C) (04/12 0958) Pulse Rate:  [79] 79 (04/12 0958) Resp:  [18] 18 (04/12 0958) BP: (125)/(91) 125/91 (04/12 0958) SpO2:  [99 %] 99 % (04/12 0958) Weight:  [102 kg-102.1 kg] 102.1 kg (04/12 0958)  Labs:   Estimated body mass index is 39.86 kg/m as calculated from the following:   Height as of this encounter: 5\' 3"  (1.6 m).   Weight as of this encounter: 102.1 kg.   Imaging Review Plain radiographs demonstrate severe degenerative joint disease of the left hip(s). The bone quality appears to be excellent for age and reported activity level.      Assessment/Plan:  End stage arthritis, left hip(s)  The patient history, physical examination, clinical judgement of the provider and imaging studies are consistent with end stage degenerative joint disease of the left hip(s) and total hip arthroplasty is deemed medically necessary. The treatment options including medical management, injection therapy, arthroscopy and arthroplasty were discussed at length. The risks and benefits of total hip arthroplasty were presented and reviewed. The risks due to aseptic loosening, infection, stiffness, dislocation/subluxation,  thromboembolic complications and other imponderables were discussed.  The patient acknowledged the explanation, agreed to proceed with the plan and consent was signed. Patient is being admitted for inpatient treatment for surgery, pain control, PT, OT, prophylactic antibiotics, VTE prophylaxis, progressive ambulation and ADL's and discharge planning.The patient is planning to be discharged home with home health services

## 2020-08-02 NOTE — Op Note (Signed)
NAME: Jordan Vang, Jordan Vang RECORD NO: [id number] ACCOUNT NO: [id number] DATE OF BIRTH: 1965-05-21 FACILITY: [facility] LOCATION: [facility] PHYSICIAN: Lind Guest. Ninfa Linden, MD  Operative Report   DATE OF PROCEDURE: 08/02/2020  PREOPERATIVE DIAGNOSIS:  Primary osteoarthritis and degenerative joint disease, left hip.  POSTOPERATIVE DIAGNOSIS:  Primary osteoarthritis and degenerative joint disease, left hip.  PROCEDURE:  Left total hip arthroplasty through direct anterior approach.  IMPLANTS:  DePuy sector Gription acetabular component size 50, size 32+0 neutral polyethylene liner, size 10 Corail femoral component with standard offset, size 32+1 ceramic hip ball.  SURGEON:  Lind Guest. Ninfa Linden, MD  ASSISTANT:  Benita Stabile, PA-C  ANESTHESIA:  Spinal.  ANTIBIOTICS:  2 g IV Ancef.  BLOOD LOSS:  300 mL.  COMPLICATIONS:  None.  INDICATIONS:  The patient is a 55 year old female, well known to me.  She has debilitating arthritis that is in her left hip.  It is detrimentally affecting her mobility, her quality of life, and activities of daily living.  This has been getting worse  over the last several months and the last year.  She has x-rays and MRI confirming end-stage arthritis of the left hip.  At this point, her hip pain is daily and she does wish to proceed with hip replacement surgery.  She understands this will be a  little bit more difficult case given her BMI of almost 40.  We did talk about the risk of acute blood loss anemia, nerve or vessel injury, fracture, infection, dislocation, DVT, and implant failure as well as skin and soft tissue issues that are related  to her morbid obesity.  She understands our goals are to decrease pain, improve mobility, and overall improve quality of life.  DESCRIPTION OF PROCEDURE:  After informed consent was obtained, appropriate left hip was marked.  She was brought to the operating room and sat up on the stretcher.  Spinal anesthesia was obtained.   She was laid in supine position on the stretcher.   Foley catheter was placed.  We were able to assess her leg lengths, and she was definitely shorter on the left than the right.  Traction boots were placed on both her feet.  Next, she was placed supine on the Hana fracture table, the perineal post in  place and both legs in line skeletal traction device and no traction applied.  Her left operative hip was prepped and draped in DuraPrep and sterile drapes.  Timeout was called.  She was identified as correct patient, correct left hip.  We then made an  incision just inferior and posterior to the anterior superior iliac spine and carried this obliquely down the leg.  We dissected down to tensor fascia lata muscle, and tensor fascia was divided to proceed with direct anterior approach to the hip.  We  identified and cauterized circumflex vessels, identified the hip capsule, opened up the hip capsule in L-type format finding a very large joint effusion.  Cobra retractors were placed within the joint capsule around the medial and lateral femoral neck  and then made a femoral neck cut with an oscillating saw proximal to the lesser trochanter and completed this with an osteotome.  I placed a corkscrew in the femoral head and removed the femoral head its entirety and found a wide area devoid of  cartilage.  I then placed a bent Hohmann over the medial acetabular rim and removed remnants of the acetabular labrum and other debris.  I then began reaming under direct visualization from a size  43 reamer in a stepwise increments going up to size 49  with all reamers placed under direct visualization.  Last reamer was also placed under direct fluoroscopy, so we could obtain our depth of reaming, our inclination and anteversion.  I then placed real DePuy sector Gription acetabular component size 50  and a 32+0 neutral polyethylene liner based off her offset.  Attention was then turned to the femur.  With the leg externally  rotated to 120 degrees, extended, and adducted, we were able to place Mueller retractor medially and Hohman retractor above the  greater trochanter.  We released lateral joint capsule and used a box cutting osteotome to enter femoral canal and a rongeur to lateralize, then began broaching using the Corail broaching system going from a size 8 all the way up to a size 10 because of  her narrow canal.  With size 10 in place, we trialed a standard offset femoral neck and a 32+1 hip ball, reduced this in acetabulum.  We were pleased with leg length, offset, range of motion, and stability, assessed radiographically and mechanically.  We  then dislocated the hip, removed the trial components.  We placed the real Corail femoral component size 10 with standard offset and the real 32+1 ceramic hip ball and again reduced this in the acetabulum and pelvis and it was stable.  We assessed again  radiographically and mechanically.  We then irrigated the soft tissue with normal saline solution.  I reapproximated the joint capsule with interrupted #1 Ethibond suture followed by #1 Vicryl to close the tensor fascia.  0 Vicryl was used to close the  deep tissue and 2-0 Vicryl was used to close subcutaneous tissue.  The skin was reapproximated with staples.  An Aquacel dressing was applied.  She was taken off the Hana table and taken to recovery room in stable condition with all final counts being  correct and no complications noted.  Of note, Benita Stabile, PA-C, did assist during the entire case and assistance was crucial for facilitating all aspects of this case.   ROH D: 08/02/2020 1:31:53 pm T: 08/02/2020 8:53:00 pm  JOB: [id number]/ [id number]

## 2020-08-02 NOTE — Anesthesia Procedure Notes (Signed)
Date/Time: 08/02/2020 12:20 PM Performed by: Jenne Campus, CRNA Pre-anesthesia Checklist: Patient identified, Emergency Drugs available, Suction available, Patient being monitored and Timeout performed Oxygen Delivery Method: Simple face mask

## 2020-08-02 NOTE — Progress Notes (Signed)
Patient has home CPAP and mask at bedside. RT plugged up the CPAP and filled water chamber for the patient.  Patient is able to apply with no assistance.

## 2020-08-02 NOTE — Evaluation (Signed)
Physical Therapy Evaluation Patient Details Name: Jordan Vang MRN: [id number] DOB: 1965/08/15 Today's Date: 08/02/2020   History of Present Illness  Pt adm 4/12 for lt THR direct anterior approach. PMH - osteoarthritis lt hip  Clinical Impression  Pt presents to PT with decr mobility due to expected post-op pain after Lt THR. Expect pt will make excellent progress toward return to independence. Pt plans to return home with husband.     Follow Up Recommendations Follow surgeon's recommendation for DC plan and follow-up therapies    Equipment Recommendations  None recommended by PT    Recommendations for Other Services       Precautions / Restrictions Precautions Precautions: None Restrictions Weight Bearing Restrictions: Yes LLE Weight Bearing: Weight bearing as tolerated      Mobility  Bed Mobility Overal bed mobility: Needs Assistance Bed Mobility: Supine to Sit     Supine to sit: Min assist     General bed mobility comments: Assist to bring LLE off of bed    Transfers Overall transfer level: Needs assistance Equipment used: Rolling walker (2 wheeled) Transfers: Sit to/from Stand Sit to Stand: Min guard         General transfer comment: Assist for safety  Ambulation/Gait Ambulation/Gait assistance: Min guard Gait Distance (Feet): 80 Feet Assistive device: Rolling walker (2 wheeled) Gait Pattern/deviations: Step-through pattern;Decreased step length - left;Decreased stance time - left;Antalgic Gait velocity: decr Gait velocity interpretation: <1.31 ft/sec, indicative of household ambulator General Gait Details: Assist for safety. Verbal cues for initial gait sequence.  Stairs            Wheelchair Mobility    Modified Rankin (Stroke Patients Only)       Balance Overall balance assessment: No apparent balance deficits (not formally assessed)                                           Pertinent Vitals/Pain Pain  Assessment: Faces Faces Pain Scale: Hurts even more Pain Location: lt hip Pain Descriptors / Indicators: Grimacing;Guarding Pain Intervention(s): Limited activity within patient's tolerance;Repositioned    Home Living Family/patient expects to be discharged to:: Private residence Living Arrangements: Spouse/significant other Available Help at Discharge: Family;Available 24 hours/day (husband will stay home until next Monday. Friends can assist if needed.) Type of Home: House Home Access: Stairs to enter Entrance Stairs-Rails: Right Entrance Stairs-Number of Steps: 4 Home Layout: One level Home Equipment: [place] - 2 wheels;Cane - single point;Bedside commode      Prior Function Level of Independence: Independent               Hand Dominance        Extremity/Trunk Assessment   Upper Extremity Assessment Upper Extremity Assessment: Overall WFL for tasks assessed    Lower Extremity Assessment Lower Extremity Assessment: LLE deficits/detail LLE Deficits / Details: limited by pain. Able to move against gravity       Communication   Communication: No difficulties  Cognition Arousal/Alertness: Awake/alert Behavior During Therapy: WFL for tasks assessed/performed Overall Cognitive Status: Within Functional Limits for tasks assessed                                        General Comments      Exercises Total Joint Exercises Ankle Circles/Pumps: 5 reps;Both;Seated Quad  Sets: Left;Seated (3 reps) Heel Slides: AAROM;Left;5 reps;Supine   Assessment/Plan    PT Assessment Patient needs continued PT services  PT Problem List Decreased strength;Decreased mobility;Pain;Decreased knowledge of use of DME       PT Treatment Interventions DME instruction;Gait training;Stair training;Functional mobility training;Therapeutic activities;Therapeutic exercise;Patient/family education    PT Goals (Current goals can be found in the Care Plan section)  Acute  Rehab PT Goals Patient Stated Goal: return home PT Goal Formulation: With patient Time For Goal Achievement: 08/05/20 Potential to Achieve Goals: Good    Frequency 7X/week   Barriers to discharge Inaccessible home environment stairs to enter    Co-evaluation               AM-PAC PT "6 Clicks" Mobility  Outcome Measure Help needed turning from your back to your side while in a flat bed without using bedrails?: A Little Help needed moving from lying on your back to sitting on the side of a flat bed without using bedrails?: A Little Help needed moving to and from a bed to a chair (including a wheelchair)?: A Little Help needed standing up from a chair using your arms (e.g., wheelchair or bedside chair)?: A Little Help needed to walk in hospital room?: A Little Help needed climbing 3-5 steps with a railing? : A Little 6 Click Score: 18    End of Session Equipment Utilized During Treatment: Gait belt Activity Tolerance: Patient tolerated treatment well Patient left: in chair;with call bell/phone within reach;with family/visitor present Nurse Communication: Mobility status PT Visit Diagnosis: Other abnormalities of gait and mobility (R26.89);Pain Pain - Right/Left: Left Pain - part of body: Hip    Time: 8115-7262 PT Time Calculation (min) (ACUTE ONLY): 22 min   Charges:   PT Evaluation $PT Eval Low Complexity: [place] Pager [phone] Office [place] 08/02/2020, 6:55 PM

## 2020-08-02 NOTE — Brief Op Note (Signed)
08/02/2020  1:34 PM  PATIENT:  Jordan Vang  55 y.o. female  PRE-OPERATIVE DIAGNOSIS:  osteoarthritis left hip  POST-OPERATIVE DIAGNOSIS:  osteoarthritis left hip  PROCEDURE:  Procedure(s): LEFT TOTAL HIP ARTHROPLASTY ANTERIOR APPROACH (Left)  SURGEON:  Surgeon(s) and Role:    Mcarthur Rossetti, MD - Primary  PHYSICIAN ASSISTANT:  Benita Stabile, PA-C  ANESTHESIA:   spinal  EBL:  300 mL   COUNTS:  YES   DICTATION: .Other Dictation: Dictation Number [id number]  PLAN OF CARE: Admit for overnight observation  PATIENT DISPOSITION:  PACU - hemodynamically stable.   Delay start of Pharmacological VTE agent (>24hrs) due to surgical blood loss or risk of bleeding: no

## 2020-08-02 NOTE — Anesthesia Postprocedure Evaluation (Signed)
Anesthesia Post Note  Patient: Jordan Vang  Procedure(s) Performed: LEFT TOTAL HIP ARTHROPLASTY ANTERIOR APPROACH (Left Hip)     Patient location during evaluation: PACU Anesthesia Type: Spinal Level of consciousness: oriented and awake and alert Pain management: pain level controlled Vital Signs Assessment: post-procedure vital signs reviewed and stable Respiratory status: spontaneous breathing, respiratory function stable and patient connected to nasal cannula oxygen Cardiovascular status: blood pressure returned to baseline and stable Postop Assessment: no headache, no backache and no apparent nausea or vomiting Anesthetic complications: no   No complications documented.  Last Vitals:  Vitals:   08/02/20 1500 08/02/20 1645  BP: 134/71 136/86  Pulse: (!) 52 (!) 57  Resp: 16 17  Temp: (!) 36.4 C 36.9 C  SpO2: 100% 100%    Last Pain:  Vitals:   08/02/20 1645  TempSrc: Oral  PainSc:                  [name] S

## 2020-08-02 NOTE — Transfer of Care (Signed)
Immediate Anesthesia Transfer of Care Note  Patient: Jordan Vang  Procedure(s) Performed: LEFT TOTAL HIP ARTHROPLASTY ANTERIOR APPROACH (Left Hip)  Patient Location: PACU  Anesthesia Type:MAC and Spinal  Level of Consciousness: awake, oriented and patient cooperative  Airway & Oxygen Therapy: Patient Spontanous Breathing and Patient connected to face mask oxygen  Post-op Assessment: Report given to RN and Post -op Vital signs reviewed and stable  Post vital signs: Reviewed  Last Vitals:  Vitals Value Taken Time  BP    Temp    Pulse 61 08/02/20 1350  Resp 14 08/02/20 1350  SpO2 95 % 08/02/20 1350  Vitals shown include unvalidated device data.  Last Pain:  Vitals:   08/02/20 0958  TempSrc: Oral  PainSc:          Complications: No complications documented.

## 2020-08-03 ENCOUNTER — Telehealth: Payer: Self-pay

## 2020-08-03 ENCOUNTER — Encounter (HOSPITAL_COMMUNITY): Payer: Self-pay | Admitting: Orthopaedic Surgery

## 2020-08-03 ENCOUNTER — Other Ambulatory Visit: Payer: Self-pay | Admitting: Orthopaedic Surgery

## 2020-08-03 DIAGNOSIS — Z20822 Contact with and (suspected) exposure to covid-19: Secondary | ICD-10-CM | POA: Diagnosis not present

## 2020-08-03 DIAGNOSIS — Z79899 Other long term (current) drug therapy: Secondary | ICD-10-CM | POA: Diagnosis not present

## 2020-08-03 DIAGNOSIS — Z9989 Dependence on other enabling machines and devices: Secondary | ICD-10-CM | POA: Diagnosis not present

## 2020-08-03 DIAGNOSIS — M1612 Unilateral primary osteoarthritis, left hip: Secondary | ICD-10-CM | POA: Diagnosis not present

## 2020-08-03 DIAGNOSIS — Z85828 Personal history of other malignant neoplasm of skin: Secondary | ICD-10-CM | POA: Diagnosis not present

## 2020-08-03 DIAGNOSIS — E039 Hypothyroidism, unspecified: Secondary | ICD-10-CM | POA: Diagnosis not present

## 2020-08-03 LAB — CBC
HCT: 36.4 % (ref 36.0–46.0)
Hemoglobin: 11.7 g/dL — ABNORMAL LOW (ref 12.0–15.0)
MCH: 27 pg (ref 26.0–34.0)
MCHC: 32.1 g/dL (ref 30.0–36.0)
MCV: 83.9 fL (ref 80.0–100.0)
Platelets: 307 10*3/uL (ref 150–400)
RBC: 4.34 MIL/uL (ref 3.87–5.11)
RDW: 13.7 % (ref 11.5–15.5)
WBC: 16.8 10*3/uL — ABNORMAL HIGH (ref 4.0–10.5)
nRBC: 0 % (ref 0.0–0.2)

## 2020-08-03 LAB — BASIC METABOLIC PANEL
Anion gap: 6 (ref 5–15)
BUN: 12 mg/dL (ref 6–20)
CO2: 27 mmol/L (ref 22–32)
Calcium: 9.5 mg/dL (ref 8.9–10.3)
Chloride: 104 mmol/L (ref 98–111)
Creatinine, Ser: 0.86 mg/dL (ref 0.44–1.00)
GFR, Estimated: >60 mL/min (ref >60)
Glucose, Bld: 147 mg/dL — ABNORMAL HIGH (ref 70–99)
Potassium: 4.3 mmol/L (ref 3.5–5.1)
Sodium: 137 mmol/L (ref 135–145)

## 2020-08-03 MED ORDER — METHOCARBAMOL 500 MG PO TABS: 500.0000 mg | ORAL_TABLET | Freq: Four times a day (QID) | ORAL | 1 refills | Status: DC | PRN

## 2020-08-03 MED ORDER — HYDROMORPHONE HCL 2 MG PO TABS: 2.0000 mg | ORAL_TABLET | ORAL | 0 refills | Status: DC | PRN

## 2020-08-03 MED ORDER — ASPIRIN 81 MG PO CHEW: 81.0000 mg | CHEWABLE_TABLET | Freq: Two times a day (BID) | ORAL | 0 refills | Status: DC

## 2020-08-03 MED ORDER — OXYCODONE HCL 5 MG PO TABS: 5.0000 mg | ORAL_TABLET | ORAL | 0 refills | Status: DC | PRN

## 2020-08-03 MED ORDER — TIZANIDINE HCL 4 MG PO TABS: 4.0000 mg | ORAL_TABLET | Freq: Three times a day (TID) | ORAL | 0 refills | Status: DC | PRN

## 2020-08-03 NOTE — Telephone Encounter (Signed)
Patients husband called he stated his wife had surgery yesterday and she has questions regarding rx she has been prescribed call back:[phone]

## 2020-08-03 NOTE — Evaluation (Signed)
Occupational Therapy Evaluation Patient Details Name: Jordan Vang MRN: [id number] DOB: [birth year] Today's Date: 08/03/2020    History of Present Illness Pt adm 4/12 for lt THR direct anterior approach. PMH - osteoarthritis lt hip   Clinical Impression   Patient admitted for the diagnosis and procedure above.  PTA patient was largely independent, but had increasing pain to L hip.  Post surgical pain is her primary defict.  Currently she is needing up to [place] for bed mobility and lower body ADL.  Her spouse will be able to assist her as needed, and [place] [facility] has been ordered.  All questions answered and patient with good understanding of precautions and hip kit usage.  No further OT needed in the acute setting.      Follow Up Recommendations  Home health OT    Equipment Recommendations  Other (comment) (hip kit)    Recommendations for Other Services       Precautions / Restrictions Precautions Precautions: None Restrictions Weight Bearing Restrictions: No LLE Weight Bearing: Weight bearing as tolerated      Mobility Bed Mobility Overal bed mobility: Needs Assistance Bed Mobility: Supine to Sit     Supine to sit: Min assist     General bed mobility comments: Assist to bring LLE off of bed    Transfers Overall transfer level: Needs assistance Equipment used: Rolling walker (2 wheeled) Transfers: Sit to/from Stand Sit to Stand: Supervision              Balance Overall balance assessment: No apparent balance deficits (not formally assessed)                                         ADL either performed or assessed with clinical judgement   ADL                                         General ADL Comments: Patient needing Min A for LB ADL due to discomfort.  She is ordering the hip kit, and spouse will assist as needed.     Vision Baseline Vision/History: No visual deficits Patient Visual Report: No change from baseline        Perception     Praxis      Pertinent Vitals/Pain Pain Assessment: Faces Faces Pain Scale: Hurts even more Pain Location: lt hip Pain Descriptors / Indicators: Grimacing;Guarding;Tender;Tightness Pain Intervention(s): Monitored during session     Hand Dominance Right   Extremity/Trunk Assessment Upper Extremity Assessment Upper Extremity Assessment: Overall WFL for tasks assessed   Lower Extremity Assessment Lower Extremity Assessment: Defer to PT evaluation   Cervical / Trunk Assessment Cervical / Trunk Assessment: Normal   Communication Communication Communication: No difficulties   Cognition Arousal/Alertness: Awake/alert Behavior During Therapy: WFL for tasks assessed/performed Overall Cognitive Status: Within Functional Limits for tasks assessed                                                      Home Living Family/patient expects to be discharged to:: Private residence Living Arrangements: Spouse/significant other Available Help at Discharge: Family;Available 24 hours/day Type of Home: [place]  Access: Stairs to enter [employer] of Steps: 4 Entrance Stairs-Rails: Right Home Layout: One level               Home Equipment: Walker - 2 wheels;Cane - single point;Bedside commode          Prior Functioning/Environment Level of Independence: Independent                 OT Problem List: Pain      OT Treatment/Interventions:      OT Goals(Current goals can be found in the care plan section) Acute Rehab OT Goals Patient Stated Goal: to feel better and do more for myself OT Goal Formulation: With patient Time For Goal Achievement: 08/03/20 Potential to Achieve Goals: Good  OT Frequency:     Barriers to D/C:  none noted          Co-evaluation              AM-PAC OT "6 Clicks" Daily Activity     Outcome Measure Help from another person eating meals?: None Help from another person taking care of  personal grooming?: None Help from another person toileting, which includes using toliet, bedpan, or urinal?: None Help from another person bathing (including washing, rinsing, drying)?: A Little Help from another person to put on and taking off regular upper body clothing?: None Help from another person to put on and taking off regular lower body clothing?: A Little 6 Click Score: 22   End of Session Equipment Utilized During Treatment: Rolling walker Nurse Communication: Mobility status  Activity Tolerance: Patient tolerated treatment well Patient left: in bed;with call bell/phone within reach;with family/visitor present  OT Visit Diagnosis: Unsteadiness on feet (R26.81);Pain Pain - Right/Left: Left Pain - part of body: Leg                Time: 0927-1000 OT Time Calculation (min): 33 min Charges:  OT General Charges $OT Visit: 1 Visit OT Evaluation $OT Eval Moderate Complexity: 1 Mod OT Treatments $Self Care/Home Management : 8-22 mins  08/03/2020  Rich, OTR/L  Acute Rehabilitation Services  Office:  [phone]   Metta Clines 08/03/2020, 10:08 AM

## 2020-08-03 NOTE — Progress Notes (Signed)
Subjective: 1 Day Post-Op Procedure(s) (LRB): LEFT TOTAL HIP ARTHROPLASTY ANTERIOR APPROACH (Left) Patient reports pain as moderate.   NO COMPLAINTS OTHERWISE.   Objective: Vital signs in last 24 hours: Temp:  [97 F (36.1 C)-99.1 F (37.3 C)] 98.9 F (37.2 C) (04/13 0810) Pulse Rate:  [47-79] 76 (04/13 0810) Resp:  [12-18] 16 (04/13 0810) BP: (93-136)/(54-91) 106/71 (04/13 0810) SpO2:  [95 %-100 %] 99 % (04/13 0810) Weight:  [102 kg-102.1 kg] 102.1 kg (04/12 0958)  Intake/Output from previous day: 04/12 0701 - 04/13 0700 In: 1250 [I.V.:1000; IV Piggyback:250] Out: 1950 [Urine:1650; Blood:300] Intake/Output this shift: No intake/output data recorded.  Recent Labs    08/03/20 0333  HGB 11.7*   Recent Labs    08/03/20 0333  WBC 16.8*  RBC 4.34  HCT 36.4  PLT 307   Recent Labs    08/03/20 0333  NA 137  K 4.3  CL 104  CO2 27  BUN 12  CREATININE 0.86  GLUCOSE 147*  CALCIUM 9.5   No results for input(s): LABPT, INR in the last 72 hours.  Left lower extremity; Dorsiflexion/Plantar flexion intact Incision: dressing C/D/I Compartment soft   Assessment/Plan: 1 Day Post-Op Procedure(s) (LRB): LEFT TOTAL HIP ARTHROPLASTY ANTERIOR APPROACH (Left) Up with therapy Discharge home with home health      Erskine Emery 08/03/2020, 9:18 AM

## 2020-08-03 NOTE — Telephone Encounter (Signed)
She can try to take a third of the oxycodone.  Just in case though I did send in something stronger which is hydromorphone or Dilaudid to her pharmacy as well as a different muscle relaxant which would be Zanaflex instead of her methocarbamol.

## 2020-08-03 NOTE — Discharge Summary (Signed)
Patient ID: Jordan Vang MRN: [id number] DOB/AGE: [birth year] 55 y.o.  Admit date: 08/02/2020 Discharge date: 08/03/2020  Admission Diagnoses:  Principal Problem:   Unilateral primary osteoarthritis, left hip Active Problems:   Status post left hip replacement   Discharge Diagnoses:  Same  Past Medical History:  Diagnosis Date  . Anemia   . Elevated cholesterol   . Hypothyroidism   . PMDD (premenstrual dysphoric disorder)    Dr Phineas Real  . Sleep apnea    CPAP  . Thyroid disease     Surgeries: Procedure(s): LEFT TOTAL HIP ARTHROPLASTY ANTERIOR APPROACH on 08/02/2020   Consultants:   Discharged Condition: Improved  Hospital Course: Jordan Vang is an 55 y.o. female who was admitted 08/02/2020 for operative treatment ofUnilateral primary osteoarthritis, left hip. Patient has severe unremitting pain that affects sleep, daily activities, and work/hobbies. After pre-op clearance the patient was taken to the operating room on 08/02/2020 and underwent  Procedure(s): LEFT TOTAL HIP ARTHROPLASTY ANTERIOR APPROACH.    Patient was given perioperative antibiotics:  Anti-infectives (From admission, onward)   Start     Dose/Rate Route Frequency Ordered Stop   08/02/20 1800  ceFAZolin (ANCEF) IVPB 1 g/50 mL premix        1 g 100 mL/hr over 30 Minutes Intravenous Every 6 hours 08/02/20 1407 08/03/20 0130   08/02/20 0930  ceFAZolin (ANCEF) IVPB 2g/100 mL premix        2 g 200 mL/hr over 30 Minutes Intravenous On call to O.R. 08/02/20 0929 08/02/20 1235       Patient was given sequential compression devices, early ambulation, and chemoprophylaxis to prevent DVT.  Patient benefited maximally from hospital stay and there were no complications.    Recent vital signs:  Patient Vitals for the past 24 hrs:  BP Temp Temp src Pulse Resp SpO2 Height Weight  08/03/20 0810 106/71 98.9 F (37.2 C) Oral 76 16 99 % -- --  08/03/20 0407 (!) 101/55 97.8 F (36.6 C) Oral (!) 54 16 100 % -- --   08/02/20 2356 116/79 98.3 F (36.8 C) Oral 68 18 96 % -- --  08/02/20 2031 124/78 98.6 F (37 C) Oral 67 17 96 % -- --  08/02/20 1645 136/86 98.4 F (36.9 C) Oral (!) 57 17 100 % -- --  08/02/20 1500 134/71 (!) 97.5 F (36.4 C) Oral (!) 52 16 100 % -- --  08/02/20 1440 104/72 (!) 97 F (36.1 C) -- (!) 47 12 100 % -- --  08/02/20 1425 95/65 -- -- 60 -- 100 % -- --  08/02/20 1410 (!) 93/58 -- -- (!) 51 18 98 % -- --  08/02/20 1355 (!) 94/54 (!) 97.5 F (36.4 C) -- (!) 58 16 95 % -- --  08/02/20 0958 (!) 125/91 99.1 F (37.3 C) Oral 79 18 99 % 5\' 3"  (1.6 m) 102.1 kg  08/02/20 0950 -- -- -- -- -- -- 5\' 3"  (1.6 m) 102 kg     Recent laboratory studies:  Recent Labs    08/03/20 0333  WBC 16.8*  HGB 11.7*  HCT 36.4  PLT 307  NA 137  K 4.3  CL 104  CO2 27  BUN 12  CREATININE 0.86  GLUCOSE 147*  CALCIUM 9.5     Discharge Medications:   Allergies as of 08/03/2020      Reactions   Azithromycin    Flu like symptoms    Naproxen Swelling   edema in legs/ankles   Celebrex [  celecoxib]    Hypertension    Tape    Redness       Medication List    TAKE these medications   acetaminophen 500 MG tablet Commonly known as: TYLENOL Take 1,000 mg by mouth daily.   aspirin 81 MG chewable tablet Chew 1 tablet (81 mg total) by mouth 2 (two) times daily.   atorvastatin 10 MG tablet Commonly known as: LIPITOR Take 10 mg by mouth at bedtime.   CALCIUM 600 + D PO Take 1 tablet by mouth daily.   carboxymethylcellulose 0.5 % Soln Commonly known as: REFRESH PLUS Place 1 drop into both eyes 3 (three) times daily as needed (dry eyes).   cholecalciferol 25 MCG (1000 UNIT) tablet Commonly known as: VITAMIN D3 Take 1,000 Units by mouth daily.   COLLAGEN PO Take 1-2 mg by mouth See admin instructions. Alternate taking 1 tablet one day and 2 tablets the next   DEEP BLUE RELIEF EX Apply 1 application topically daily as needed (pain).   fexofenadine 180 MG tablet Commonly known  as: ALLEGRA Take 180 mg by mouth daily.   Fish Oil 1000 MG Caps Take 1,000 mg by mouth daily.   fluticasone 50 MCG/ACT nasal spray Commonly known as: FLONASE USE 1 SPRAY IN EACH NOSTRIL ONCE DAILY. What changed:   when to take this  reasons to take this   ibuprofen 200 MG tablet Commonly known as: ADVIL Take 600 mg by mouth every 8 (eight) hours as needed for moderate pain.   ketotifen 0.025 % ophthalmic solution Commonly known as: ZADITOR Place 1 drop into both eyes 2 (two) times daily as needed (allergies).   levothyroxine 50 MCG tablet Commonly known as: SYNTHROID Take 1 tablet (50 mcg total) by mouth daily. Must keep 01/03/17 appt for future refills   MELATONIN PO Take 1.5 mg by mouth at bedtime.   methocarbamol 500 MG tablet Commonly known as: ROBAXIN Take 500 mg by mouth every 8 (eight) hours as needed for muscle spasms. What changed: Another medication with the same name was added. Make sure you understand how and when to take each.   methocarbamol 500 MG tablet Commonly known as: ROBAXIN Take 1 tablet (500 mg total) by mouth every 6 (six) hours as needed for muscle spasms. What changed: You were already taking a medication with the same name, and this prescription was added. Make sure you understand how and when to take each.   multivitamin with minerals Tabs tablet Take 2 tablets by mouth daily.   oxyCODONE 5 MG immediate release tablet Commonly known as: Oxy IR/ROXICODONE Take 1-2 tablets (5-10 mg total) by mouth every 4 (four) hours as needed for moderate pain (pain score 4-6).   sertraline 25 MG tablet Commonly known as: ZOLOFT Take 1 tablet (25 mg total) by mouth daily.   SOOTHE XP OP Place 1 drop into both eyes daily as needed (dry eyes).   triamcinolone cream 0.1 % Commonly known as: KENALOG Apply 1 application topically 2 (two) times daily as needed (itching).   Turmeric Curcumin 500 MG Caps Take 500 mg by mouth daily.       Diagnostic  Studies: DG Pelvis Portable  Result Date: 08/02/2020 CLINICAL DATA:  Patient status post left hip replacement today. EXAM: PORTABLE PELVIS 1-2 VIEWS COMPARISON:  Plain films left hip 0 06/08/2020. FINDINGS: New left total hip arthroplasty is in place. There is some gas in the soft tissues and surgical staples. No acute abnormality. IMPRESSION: Status post left hip  replacement.  No acute finding. Electronically Signed   By: Inge Rise M.D.   On: 08/02/2020 14:47   DG C-Arm 1-60 Min  Result Date: 08/02/2020 CLINICAL DATA:  Surgery, elective. Additional history provided: Left total hip arthroplasty anterior approach. Provided fluoroscopy time: 16.5 seconds (1.1575 mGy). EXAM: OPERATIVE left HIP (WITH PELVIS IF PERFORMED) 3 VIEWS TECHNIQUE: Fluoroscopic spot image(s) were submitted for interpretation post-operatively. COMPARISON:  Radiographs of the left hip 06/08/2020. MRI of the left hip 05/05/2020. FINDINGS: Three intraoperative fluoroscopic images of the left hip are submitted. The images demonstrate findings of interval left total hip arthroplasty. The femoral and acetabular components appear well seated. No unexpected finding on the provided views. IMPRESSION: Three intraoperative fluoroscopic images of the left hip from left total hip arthroplasty, as described. Electronically Signed   By: Kellie Simmering DO   On: 08/02/2020 13:54   DG HIP OPERATIVE UNILAT W OR W/O PELVIS LEFT  Result Date: 08/02/2020 CLINICAL DATA:  Surgery, elective. Additional history provided: Left total hip arthroplasty anterior approach. Provided fluoroscopy time: 16.5 seconds (1.1575 mGy). EXAM: OPERATIVE left HIP (WITH PELVIS IF PERFORMED) 3 VIEWS TECHNIQUE: Fluoroscopic spot image(s) were submitted for interpretation post-operatively. COMPARISON:  Radiographs of the left hip 06/08/2020. MRI of the left hip 05/05/2020. FINDINGS: Three intraoperative fluoroscopic images of the left hip are submitted. The images demonstrate  findings of interval left total hip arthroplasty. The femoral and acetabular components appear well seated. No unexpected finding on the provided views. IMPRESSION: Three intraoperative fluoroscopic images of the left hip from left total hip arthroplasty, as described. Electronically Signed   By: Kellie Simmering DO   On: 08/02/2020 13:54    Disposition:      Follow-up Information    Mcarthur Rossetti, MD Follow up in 2 week(s).   Specialty: Orthopedic Surgery Contact information: [address] [place] [address] [phone]                Signed: Erskine Emery 08/03/2020, 9:20 AM

## 2020-08-03 NOTE — Discharge Instructions (Signed)

## 2020-08-03 NOTE — Care Management (Signed)
Received a call from [place] with [facility]. Patient has been arranged for HHPT .   Magdalen Spatz RN

## 2020-08-03 NOTE — Telephone Encounter (Signed)
Patient states she is just in so much pain and the oxycodone 5-10mg  is just not enough She's crying stating she is take oxycodone and Robaxin

## 2020-08-03 NOTE — Progress Notes (Signed)
Physical Therapy Treatment Patient Details Name: Jordan Vang MRN: [id number] DOB: 1966/02/08 Today's Date: 08/03/2020    History of Present Illness Pt adm 4/12 for lt THR direct anterior approach. PMH - osteoarthritis lt hip    PT Comments    Pt received seated EOB, OT leaving room and spouse present, pt somewhat anxious but agreeable to therapy session and with good participation and tolerance for transfer, gait and stair training. Pt instructed on IS use and supine/seated/standing LLE HEP and performed with good tolerance as detailed below, able to max out IS x10 reps but did report some dizziness. Pt orthostatics stable and anticipate dizziness may be related to pain meds. Pt progressed to greater than household distance gait task using RW with increased time/supervision and performed stair trial per home setup with up to minA. Reviewed techniques for improved independence with bed mobility. Pt continues to benefit from PT services to progress toward functional mobility goals. Continue to recommend discharge home with post-acute PT services (HHPT), anticipate pt ready from a PT perspective for DC once medically cleared.  Follow Up Recommendations  Follow surgeon's recommendation for DC plan and follow-up therapies     Equipment Recommendations  None recommended by PT    Recommendations for Other Services       Precautions / Restrictions Precautions Precautions: None Restrictions Weight Bearing Restrictions: Yes LLE Weight Bearing: Weight bearing as tolerated    Mobility  Bed Mobility Overal bed mobility: Needs Assistance Bed Mobility: Sit to Supine     Supine to sit: Min assist Sit to supine: Min guard   General bed mobility comments: HOB flat and no rails per home setup; pt used strap to assist but still needs min guard/heavy cues for technique    Transfers Overall transfer level: Needs assistance Equipment used: Rolling walker (2 wheeled) Transfers: Sit to/from  Stand Sit to Stand: Supervision         General transfer comment: cues for sequencing; to and from toilet/EOB heights  Ambulation/Gait Ambulation/Gait assistance: Supervision Gait Distance (Feet): 150 Feet Assistive device: Rolling walker (2 wheeled) Gait Pattern/deviations: Step-through pattern;Decreased step length - right;Decreased stride length;Antalgic Gait velocity: variable; 0.1-0.3 m/s   General Gait Details: Assist for safety. Verbal cues for initial gait sequence, increased time to perform and decreased L foot clearance due to pain with hip flexion; heavy reliance on RW   Stairs Stairs: Yes Stairs assistance: Min assist Stair Management: One rail Right;Forwards;Step to pattern Number of Stairs: 3 General stair comments: pt performed single step up/down x1 trial then 2 steps up/down x1 trial with standing break between; no buckling or LOB but minA via HHA on LUE and R rail per home setup   Wheelchair Mobility    Modified Rankin (Stroke Patients Only)       Balance Overall balance assessment: Mild deficits observed, not formally tested (pain limiting balance post-op; stable using RW)                                          Cognition Arousal/Alertness: Awake/alert Behavior During Therapy: WFL for tasks assessed/performed;Anxious Overall Cognitive Status: Within Functional Limits for tasks assessed                                 General Comments: good following of 1-step commands, anxious and internally distracted due  to pain so needs some repetition of multi-step commands      Exercises Total Joint Exercises Ankle Circles/Pumps: Both;Seated;AROM;15 reps Quad Sets: Supine;Both;5 reps Short Arc Quad: AROM;Left;Other (comment);Supine (3 reps) Heel Slides: AAROM;Left;5 reps;Supine (strap to assist due to wearing shoes in bed, encouraged not to use strap/shoes off at home) Hip ABduction/ADduction: AAROM;Left;5 reps;Supine (assist  to keep L heel elevated) Long Arc Quad: AROM;Left;10 reps;Seated Marching in Standing:  (verbal review) Standing Hip Extension:  (verbal review)    General Comments General comments (skin integrity, edema, etc.): seated BP 112/76 (87); standing BP 123/77 (91); mild dizziness reported prior to mobility however likely due to pain meds, VSS on RA HR 74-90 bpm with mobility and SpO2 99-100% on RA; she pulling 2250 on IS x10 reps      Pertinent Vitals/Pain Pain Assessment: Faces Faces Pain Scale: Hurts even more Pain Location: lt hip, worst with flexion Pain Descriptors / Indicators: Grimacing;Guarding;Tender;Tightness Pain Intervention(s): Monitored during session;Premedicated before session;Repositioned;Ice applied    Home Living Family/patient expects to be discharged to:: Private residence Living Arrangements: Spouse/significant other Available Help at Discharge: Family;Available 24 hours/day Type of Home: House Home Access: Stairs to enter Entrance Stairs-Rails: Right Home Layout: One level Home Equipment: Walker - 2 wheels;Cane - single point;Bedside commode      Prior Function Level of Independence: Independent          PT Goals (current goals can now be found in the care plan section) Acute Rehab PT Goals Patient Stated Goal: to feel better and do more for myself PT Goal Formulation: With patient Time For Goal Achievement: 08/05/20 Potential to Achieve Goals: Good Progress towards PT goals: Progressing toward goals    Frequency    7X/week      PT Plan Current plan remains appropriate    Co-evaluation              AM-PAC PT "6 Clicks" Mobility   Outcome Measure  Help needed turning from your back to your side while in a flat bed without using bedrails?: A Little Help needed moving from lying on your back to sitting on the side of a flat bed without using bedrails?: A Little Help needed moving to and from a bed to a chair (including a wheelchair)?: A  Little Help needed standing up from a chair using your arms (e.g., wheelchair or bedside chair)?: A Little Help needed to walk in hospital room?: A Little Help needed climbing 3-5 steps with a railing? : A Little 6 Click Score: 18    End of Session Equipment Utilized During Treatment: Gait belt Activity Tolerance: Patient tolerated treatment well Patient left: in bed;with call bell/phone within reach;with family/visitor present (ice to L hip) Nurse Communication: Mobility status;Other (comment) (pt ready for DC from PT perspective) PT Visit Diagnosis: Other abnormalities of gait and mobility (R26.89);Pain Pain - Right/Left: Left Pain - part of body: Hip     Time: 1001-1049 PT Time Calculation (min) (ACUTE ONLY): 48 min  Charges:  $Gait Training: 8-22 mins $Therapeutic Exercise: 8-22 mins $Therapeutic Activity: 8-22 mins                     Audrey Eller P., PTA Acute Rehabilitation Services Pager: [phone] Office: [place] 08/03/2020, 12:12 PM

## 2020-08-03 NOTE — Progress Notes (Signed)
Patient was transported via wheelchair by volunteer for discharge home; in no acute distress or complaints of pain nor discomfort; all belongings brought along by husband; discharge instructions given by RN to patient and husband and both verbalized understanding.

## 2020-08-03 NOTE — Telephone Encounter (Signed)
Patient aware of the below message  

## 2020-08-04 ENCOUNTER — Telehealth: Payer: Self-pay | Admitting: Orthopaedic Surgery

## 2020-08-04 DIAGNOSIS — E669 Obesity, unspecified: Secondary | ICD-10-CM | POA: Diagnosis not present

## 2020-08-04 DIAGNOSIS — D649 Anemia, unspecified: Secondary | ICD-10-CM | POA: Diagnosis not present

## 2020-08-04 DIAGNOSIS — G4730 Sleep apnea, unspecified: Secondary | ICD-10-CM | POA: Diagnosis not present

## 2020-08-04 DIAGNOSIS — E039 Hypothyroidism, unspecified: Secondary | ICD-10-CM | POA: Diagnosis not present

## 2020-08-04 DIAGNOSIS — Z7982 Long term (current) use of aspirin: Secondary | ICD-10-CM | POA: Diagnosis not present

## 2020-08-04 DIAGNOSIS — Z85828 Personal history of other malignant neoplasm of skin: Secondary | ICD-10-CM | POA: Diagnosis not present

## 2020-08-04 DIAGNOSIS — E7800 Pure hypercholesterolemia, unspecified: Secondary | ICD-10-CM | POA: Diagnosis not present

## 2020-08-04 DIAGNOSIS — Z6839 Body mass index (BMI) 39.0-39.9, adult: Secondary | ICD-10-CM | POA: Diagnosis not present

## 2020-08-04 DIAGNOSIS — F3281 Premenstrual dysphoric disorder: Secondary | ICD-10-CM | POA: Diagnosis not present

## 2020-08-04 DIAGNOSIS — E538 Deficiency of other specified B group vitamins: Secondary | ICD-10-CM | POA: Diagnosis not present

## 2020-08-04 DIAGNOSIS — Z471 Aftercare following joint replacement surgery: Secondary | ICD-10-CM | POA: Diagnosis not present

## 2020-08-04 DIAGNOSIS — Z96642 Presence of left artificial hip joint: Secondary | ICD-10-CM | POA: Diagnosis not present

## 2020-08-04 NOTE — Telephone Encounter (Signed)
Pt husband called and states that in the discharge paperwork she can take 1000 mg of tylenol. Her primary care physician said you can normally take 4000 a day but to check with the person she had surgery with. Is this okay? CB [phone]

## 2020-08-04 NOTE — Telephone Encounter (Signed)
Called and informed.

## 2020-08-04 NOTE — Telephone Encounter (Signed)
It is okay to take what her primary care physician said she can take in terms of the Tylenol.

## 2020-08-04 NOTE — Telephone Encounter (Signed)
Please advise 

## 2020-08-06 DIAGNOSIS — Z471 Aftercare following joint replacement surgery: Secondary | ICD-10-CM | POA: Diagnosis not present

## 2020-08-06 DIAGNOSIS — E538 Deficiency of other specified B group vitamins: Secondary | ICD-10-CM | POA: Diagnosis not present

## 2020-08-06 DIAGNOSIS — G4730 Sleep apnea, unspecified: Secondary | ICD-10-CM | POA: Diagnosis not present

## 2020-08-06 DIAGNOSIS — F3281 Premenstrual dysphoric disorder: Secondary | ICD-10-CM | POA: Diagnosis not present

## 2020-08-06 DIAGNOSIS — D649 Anemia, unspecified: Secondary | ICD-10-CM | POA: Diagnosis not present

## 2020-08-06 DIAGNOSIS — Z7982 Long term (current) use of aspirin: Secondary | ICD-10-CM | POA: Diagnosis not present

## 2020-08-06 DIAGNOSIS — E7800 Pure hypercholesterolemia, unspecified: Secondary | ICD-10-CM | POA: Diagnosis not present

## 2020-08-06 DIAGNOSIS — Z96642 Presence of left artificial hip joint: Secondary | ICD-10-CM | POA: Diagnosis not present

## 2020-08-06 DIAGNOSIS — Z6839 Body mass index (BMI) 39.0-39.9, adult: Secondary | ICD-10-CM | POA: Diagnosis not present

## 2020-08-06 DIAGNOSIS — E669 Obesity, unspecified: Secondary | ICD-10-CM | POA: Diagnosis not present

## 2020-08-06 DIAGNOSIS — E039 Hypothyroidism, unspecified: Secondary | ICD-10-CM | POA: Diagnosis not present

## 2020-08-06 DIAGNOSIS — Z85828 Personal history of other malignant neoplasm of skin: Secondary | ICD-10-CM | POA: Diagnosis not present

## 2020-08-08 DIAGNOSIS — E7800 Pure hypercholesterolemia, unspecified: Secondary | ICD-10-CM | POA: Diagnosis not present

## 2020-08-08 DIAGNOSIS — Z471 Aftercare following joint replacement surgery: Secondary | ICD-10-CM | POA: Diagnosis not present

## 2020-08-08 DIAGNOSIS — F3281 Premenstrual dysphoric disorder: Secondary | ICD-10-CM | POA: Diagnosis not present

## 2020-08-08 DIAGNOSIS — E039 Hypothyroidism, unspecified: Secondary | ICD-10-CM | POA: Diagnosis not present

## 2020-08-08 DIAGNOSIS — Z85828 Personal history of other malignant neoplasm of skin: Secondary | ICD-10-CM | POA: Diagnosis not present

## 2020-08-08 DIAGNOSIS — G4730 Sleep apnea, unspecified: Secondary | ICD-10-CM | POA: Diagnosis not present

## 2020-08-08 DIAGNOSIS — E669 Obesity, unspecified: Secondary | ICD-10-CM | POA: Diagnosis not present

## 2020-08-08 DIAGNOSIS — Z7982 Long term (current) use of aspirin: Secondary | ICD-10-CM | POA: Diagnosis not present

## 2020-08-08 DIAGNOSIS — E538 Deficiency of other specified B group vitamins: Secondary | ICD-10-CM | POA: Diagnosis not present

## 2020-08-08 DIAGNOSIS — Z96642 Presence of left artificial hip joint: Secondary | ICD-10-CM | POA: Diagnosis not present

## 2020-08-08 DIAGNOSIS — D649 Anemia, unspecified: Secondary | ICD-10-CM | POA: Diagnosis not present

## 2020-08-08 DIAGNOSIS — Z6839 Body mass index (BMI) 39.0-39.9, adult: Secondary | ICD-10-CM | POA: Diagnosis not present

## 2020-08-10 DIAGNOSIS — E039 Hypothyroidism, unspecified: Secondary | ICD-10-CM | POA: Diagnosis not present

## 2020-08-10 DIAGNOSIS — D649 Anemia, unspecified: Secondary | ICD-10-CM | POA: Diagnosis not present

## 2020-08-10 DIAGNOSIS — G4730 Sleep apnea, unspecified: Secondary | ICD-10-CM | POA: Diagnosis not present

## 2020-08-10 DIAGNOSIS — Z7982 Long term (current) use of aspirin: Secondary | ICD-10-CM | POA: Diagnosis not present

## 2020-08-10 DIAGNOSIS — E7800 Pure hypercholesterolemia, unspecified: Secondary | ICD-10-CM | POA: Diagnosis not present

## 2020-08-10 DIAGNOSIS — Z85828 Personal history of other malignant neoplasm of skin: Secondary | ICD-10-CM | POA: Diagnosis not present

## 2020-08-10 DIAGNOSIS — F3281 Premenstrual dysphoric disorder: Secondary | ICD-10-CM | POA: Diagnosis not present

## 2020-08-10 DIAGNOSIS — Z96642 Presence of left artificial hip joint: Secondary | ICD-10-CM | POA: Diagnosis not present

## 2020-08-10 DIAGNOSIS — E669 Obesity, unspecified: Secondary | ICD-10-CM | POA: Diagnosis not present

## 2020-08-10 DIAGNOSIS — E538 Deficiency of other specified B group vitamins: Secondary | ICD-10-CM | POA: Diagnosis not present

## 2020-08-10 DIAGNOSIS — Z6839 Body mass index (BMI) 39.0-39.9, adult: Secondary | ICD-10-CM | POA: Diagnosis not present

## 2020-08-10 DIAGNOSIS — Z471 Aftercare following joint replacement surgery: Secondary | ICD-10-CM | POA: Diagnosis not present

## 2020-08-11 DIAGNOSIS — F3281 Premenstrual dysphoric disorder: Secondary | ICD-10-CM | POA: Diagnosis not present

## 2020-08-11 DIAGNOSIS — Z85828 Personal history of other malignant neoplasm of skin: Secondary | ICD-10-CM | POA: Diagnosis not present

## 2020-08-11 DIAGNOSIS — D649 Anemia, unspecified: Secondary | ICD-10-CM | POA: Diagnosis not present

## 2020-08-11 DIAGNOSIS — E669 Obesity, unspecified: Secondary | ICD-10-CM | POA: Diagnosis not present

## 2020-08-11 DIAGNOSIS — Z96642 Presence of left artificial hip joint: Secondary | ICD-10-CM | POA: Diagnosis not present

## 2020-08-11 DIAGNOSIS — Z7982 Long term (current) use of aspirin: Secondary | ICD-10-CM | POA: Diagnosis not present

## 2020-08-11 DIAGNOSIS — Z471 Aftercare following joint replacement surgery: Secondary | ICD-10-CM | POA: Diagnosis not present

## 2020-08-11 DIAGNOSIS — G4730 Sleep apnea, unspecified: Secondary | ICD-10-CM | POA: Diagnosis not present

## 2020-08-11 DIAGNOSIS — E538 Deficiency of other specified B group vitamins: Secondary | ICD-10-CM | POA: Diagnosis not present

## 2020-08-11 DIAGNOSIS — E7800 Pure hypercholesterolemia, unspecified: Secondary | ICD-10-CM | POA: Diagnosis not present

## 2020-08-11 DIAGNOSIS — E039 Hypothyroidism, unspecified: Secondary | ICD-10-CM | POA: Diagnosis not present

## 2020-08-11 DIAGNOSIS — Z6839 Body mass index (BMI) 39.0-39.9, adult: Secondary | ICD-10-CM | POA: Diagnosis not present

## 2020-08-15 ENCOUNTER — Ambulatory Visit (INDEPENDENT_AMBULATORY_CARE_PROVIDER_SITE_OTHER): Payer: BC Managed Care – PPO | Admitting: Orthopaedic Surgery

## 2020-08-15 ENCOUNTER — Encounter: Payer: Self-pay | Admitting: Orthopaedic Surgery

## 2020-08-15 DIAGNOSIS — Z96642 Presence of left artificial hip joint: Secondary | ICD-10-CM

## 2020-08-15 DIAGNOSIS — Z7982 Long term (current) use of aspirin: Secondary | ICD-10-CM | POA: Diagnosis not present

## 2020-08-15 DIAGNOSIS — E7800 Pure hypercholesterolemia, unspecified: Secondary | ICD-10-CM | POA: Diagnosis not present

## 2020-08-15 DIAGNOSIS — E039 Hypothyroidism, unspecified: Secondary | ICD-10-CM | POA: Diagnosis not present

## 2020-08-15 DIAGNOSIS — D649 Anemia, unspecified: Secondary | ICD-10-CM | POA: Diagnosis not present

## 2020-08-15 DIAGNOSIS — Z85828 Personal history of other malignant neoplasm of skin: Secondary | ICD-10-CM | POA: Diagnosis not present

## 2020-08-15 DIAGNOSIS — E538 Deficiency of other specified B group vitamins: Secondary | ICD-10-CM | POA: Diagnosis not present

## 2020-08-15 DIAGNOSIS — E669 Obesity, unspecified: Secondary | ICD-10-CM | POA: Diagnosis not present

## 2020-08-15 DIAGNOSIS — G4730 Sleep apnea, unspecified: Secondary | ICD-10-CM | POA: Diagnosis not present

## 2020-08-15 DIAGNOSIS — Z471 Aftercare following joint replacement surgery: Secondary | ICD-10-CM | POA: Diagnosis not present

## 2020-08-15 DIAGNOSIS — F3281 Premenstrual dysphoric disorder: Secondary | ICD-10-CM | POA: Diagnosis not present

## 2020-08-15 DIAGNOSIS — Z6839 Body mass index (BMI) 39.0-39.9, adult: Secondary | ICD-10-CM | POA: Diagnosis not present

## 2020-08-15 NOTE — Progress Notes (Signed)
The patient is 2 weeks tomorrow status post a left total hip arthroplasty.  She seems to be doing well but is frustrating for her because she cannot sleep well.  She has been in contact about using a walker and a cane.  Her left hip incision looks great today.  Remove staples in place Steri-Strips.  There is only mild seroma of about 25 cc that I drained off of the hip area.  There is no evidence of infection and her leg lengths are equal.  Gave her reassurance that she will keep improving as time goes by.  She will wear compressive garments until her swelling stops in her feet which is minimal.  She can stop her aspirin from my standpoint since she is very mobile.  If she needs a refill of methocarbamol she will let us know.  All question concerns were answered and addressed.  In 4 weeks no x-rays are needed.

## 2020-08-16 DIAGNOSIS — H0100B Unspecified blepharitis left eye, upper and lower eyelids: Secondary | ICD-10-CM | POA: Diagnosis not present

## 2020-08-16 DIAGNOSIS — H0100A Unspecified blepharitis right eye, upper and lower eyelids: Secondary | ICD-10-CM | POA: Diagnosis not present

## 2020-08-16 DIAGNOSIS — H524 Presbyopia: Secondary | ICD-10-CM | POA: Diagnosis not present

## 2020-08-16 DIAGNOSIS — H52203 Unspecified astigmatism, bilateral: Secondary | ICD-10-CM | POA: Diagnosis not present

## 2020-08-17 DIAGNOSIS — E039 Hypothyroidism, unspecified: Secondary | ICD-10-CM | POA: Diagnosis not present

## 2020-08-17 DIAGNOSIS — D649 Anemia, unspecified: Secondary | ICD-10-CM | POA: Diagnosis not present

## 2020-08-17 DIAGNOSIS — E669 Obesity, unspecified: Secondary | ICD-10-CM | POA: Diagnosis not present

## 2020-08-17 DIAGNOSIS — G4730 Sleep apnea, unspecified: Secondary | ICD-10-CM | POA: Diagnosis not present

## 2020-08-17 DIAGNOSIS — Z85828 Personal history of other malignant neoplasm of skin: Secondary | ICD-10-CM | POA: Diagnosis not present

## 2020-08-17 DIAGNOSIS — Z7982 Long term (current) use of aspirin: Secondary | ICD-10-CM | POA: Diagnosis not present

## 2020-08-17 DIAGNOSIS — Z471 Aftercare following joint replacement surgery: Secondary | ICD-10-CM | POA: Diagnosis not present

## 2020-08-17 DIAGNOSIS — Z96642 Presence of left artificial hip joint: Secondary | ICD-10-CM | POA: Diagnosis not present

## 2020-08-17 DIAGNOSIS — E7800 Pure hypercholesterolemia, unspecified: Secondary | ICD-10-CM | POA: Diagnosis not present

## 2020-08-17 DIAGNOSIS — E538 Deficiency of other specified B group vitamins: Secondary | ICD-10-CM | POA: Diagnosis not present

## 2020-08-17 DIAGNOSIS — Z6839 Body mass index (BMI) 39.0-39.9, adult: Secondary | ICD-10-CM | POA: Diagnosis not present

## 2020-08-17 DIAGNOSIS — F3281 Premenstrual dysphoric disorder: Secondary | ICD-10-CM | POA: Diagnosis not present

## 2020-08-25 ENCOUNTER — Inpatient Hospital Stay: Payer: BC Managed Care – PPO | Admitting: Orthopaedic Surgery

## 2020-08-31 DIAGNOSIS — M1612 Unilateral primary osteoarthritis, left hip: Secondary | ICD-10-CM | POA: Diagnosis not present

## 2020-08-31 DIAGNOSIS — M25552 Pain in left hip: Secondary | ICD-10-CM | POA: Diagnosis not present

## 2020-08-31 DIAGNOSIS — G4733 Obstructive sleep apnea (adult) (pediatric): Secondary | ICD-10-CM | POA: Diagnosis not present

## 2020-08-31 DIAGNOSIS — M25511 Pain in right shoulder: Secondary | ICD-10-CM | POA: Diagnosis not present

## 2020-09-05 DIAGNOSIS — M25511 Pain in right shoulder: Secondary | ICD-10-CM | POA: Diagnosis not present

## 2020-09-05 DIAGNOSIS — M1612 Unilateral primary osteoarthritis, left hip: Secondary | ICD-10-CM | POA: Diagnosis not present

## 2020-09-05 DIAGNOSIS — M25552 Pain in left hip: Secondary | ICD-10-CM | POA: Diagnosis not present

## 2020-09-08 DIAGNOSIS — M25511 Pain in right shoulder: Secondary | ICD-10-CM | POA: Diagnosis not present

## 2020-09-08 DIAGNOSIS — M25552 Pain in left hip: Secondary | ICD-10-CM | POA: Diagnosis not present

## 2020-09-08 DIAGNOSIS — M1612 Unilateral primary osteoarthritis, left hip: Secondary | ICD-10-CM | POA: Diagnosis not present

## 2020-09-09 DIAGNOSIS — G4733 Obstructive sleep apnea (adult) (pediatric): Secondary | ICD-10-CM | POA: Diagnosis not present

## 2020-09-09 DIAGNOSIS — F3341 Major depressive disorder, recurrent, in partial remission: Secondary | ICD-10-CM | POA: Diagnosis not present

## 2020-09-12 ENCOUNTER — Encounter: Payer: Self-pay | Admitting: Orthopaedic Surgery

## 2020-09-12 ENCOUNTER — Other Ambulatory Visit: Payer: Self-pay

## 2020-09-12 ENCOUNTER — Ambulatory Visit (INDEPENDENT_AMBULATORY_CARE_PROVIDER_SITE_OTHER): Payer: BC Managed Care – PPO | Admitting: Orthopaedic Surgery

## 2020-09-12 DIAGNOSIS — Z96642 Presence of left artificial hip joint: Secondary | ICD-10-CM

## 2020-09-12 NOTE — Progress Notes (Signed)
The patient is now 6-week status post a left total hip arthroplasty.  The left hip is doing well.  There is a slight leg length discrepancy with the right side now shorter than the left.  She is going to outpatient physical therapy and feels like she is making good progress with range of motion and strength.  She has been working on balance and coordination.  She is a very active 55 year old.  She has excellent range of motion of her left operative hip.  There is a slight leg and extremity without left side longer than the right.  I explained what this means and went over how this happens.  She is pleased overall.  I do feel that she would benefit from a insert that she would wear just in her right shoe to get her back out to have better balance length.  I will see her back in 6 months to see how she is doing overall.  I gave her prescription for [employer] for shoe insert for her right shoe.  At her 41-month follow-up visit I would like a standing AP pelvis and lateral of her left operative hip.  All questions and concerns were answered and addressed.

## 2020-09-13 DIAGNOSIS — M25511 Pain in right shoulder: Secondary | ICD-10-CM | POA: Diagnosis not present

## 2020-09-13 DIAGNOSIS — M25552 Pain in left hip: Secondary | ICD-10-CM | POA: Diagnosis not present

## 2020-09-13 DIAGNOSIS — M1612 Unilateral primary osteoarthritis, left hip: Secondary | ICD-10-CM | POA: Diagnosis not present

## 2020-09-15 DIAGNOSIS — M25511 Pain in right shoulder: Secondary | ICD-10-CM | POA: Diagnosis not present

## 2020-09-15 DIAGNOSIS — M25552 Pain in left hip: Secondary | ICD-10-CM | POA: Diagnosis not present

## 2020-09-15 DIAGNOSIS — M1612 Unilateral primary osteoarthritis, left hip: Secondary | ICD-10-CM | POA: Diagnosis not present

## 2020-09-27 ENCOUNTER — Other Ambulatory Visit: Payer: Self-pay

## 2020-09-27 ENCOUNTER — Encounter: Payer: Self-pay | Admitting: Orthopaedic Surgery

## 2020-09-27 ENCOUNTER — Telehealth: Payer: Self-pay | Admitting: Orthopaedic Surgery

## 2020-09-27 MED ORDER — AMOXICILLIN 500 MG PO TABS: ORAL_TABLET | ORAL | 0 refills | Status: DC

## 2020-09-27 NOTE — Telephone Encounter (Signed)
Patient aware Rx sent to her pharmacy for Amox

## 2020-09-27 NOTE — Telephone Encounter (Signed)
Pt calling wanting to speak with a nurse because she has a dental appt coming up but is asking about pre-medication prior to the appt. Pt called the dental office and they said it was up to the pt if that is what she decided and the prescription would have to come from [place] but she wants to confirm that this is okay as she is now ~8 weeks post op. The best call back number is [phone].

## 2020-09-29 DIAGNOSIS — M21761 Unequal limb length (acquired), right tibia: Secondary | ICD-10-CM | POA: Diagnosis not present

## 2020-09-29 DIAGNOSIS — M25552 Pain in left hip: Secondary | ICD-10-CM | POA: Diagnosis not present

## 2020-09-29 DIAGNOSIS — M25511 Pain in right shoulder: Secondary | ICD-10-CM | POA: Diagnosis not present

## 2020-09-29 DIAGNOSIS — M1612 Unilateral primary osteoarthritis, left hip: Secondary | ICD-10-CM | POA: Diagnosis not present

## 2020-09-29 DIAGNOSIS — G4733 Obstructive sleep apnea (adult) (pediatric): Secondary | ICD-10-CM | POA: Diagnosis not present

## 2020-09-30 ENCOUNTER — Telehealth: Payer: Self-pay | Admitting: Orthopaedic Surgery

## 2020-09-30 NOTE — Telephone Encounter (Signed)
Pt portal message sent to get clarification

## 2020-09-30 NOTE — Telephone Encounter (Signed)
[employer] called and would like someone to call them.   CB [phone]

## 2020-10-10 ENCOUNTER — Other Ambulatory Visit: Payer: Self-pay | Admitting: Internal Medicine

## 2020-10-10 DIAGNOSIS — Z1231 Encounter for screening mammogram for malignant neoplasm of breast: Secondary | ICD-10-CM

## 2020-10-21 DIAGNOSIS — M25559 Pain in unspecified hip: Secondary | ICD-10-CM | POA: Diagnosis not present

## 2020-11-04 DIAGNOSIS — M25559 Pain in unspecified hip: Secondary | ICD-10-CM | POA: Diagnosis not present

## 2020-11-08 DIAGNOSIS — G4733 Obstructive sleep apnea (adult) (pediatric): Secondary | ICD-10-CM | POA: Diagnosis not present

## 2020-11-08 DIAGNOSIS — E039 Hypothyroidism, unspecified: Secondary | ICD-10-CM | POA: Diagnosis not present

## 2020-11-08 DIAGNOSIS — F3341 Major depressive disorder, recurrent, in partial remission: Secondary | ICD-10-CM | POA: Diagnosis not present

## 2020-11-08 DIAGNOSIS — R635 Abnormal weight gain: Secondary | ICD-10-CM | POA: Diagnosis not present

## 2020-11-09 DIAGNOSIS — M25559 Pain in unspecified hip: Secondary | ICD-10-CM | POA: Diagnosis not present

## 2020-11-10 ENCOUNTER — Ambulatory Visit: Payer: BC Managed Care – PPO | Admitting: Nurse Practitioner

## 2020-11-10 ENCOUNTER — Encounter: Payer: BC Managed Care – PPO | Admitting: Obstetrics and Gynecology

## 2020-11-25 DIAGNOSIS — M25559 Pain in unspecified hip: Secondary | ICD-10-CM | POA: Diagnosis not present

## 2020-11-30 DIAGNOSIS — M25559 Pain in unspecified hip: Secondary | ICD-10-CM | POA: Diagnosis not present

## 2020-12-06 ENCOUNTER — Ambulatory Visit
Admission: RE | Admit: 2020-12-06 | Discharge: 2020-12-06 | Disposition: A | Discharge status: Home / Self Care | Payer: BC Managed Care – PPO | Source: Ambulatory Visit | Attending: Internal Medicine | Admitting: Internal Medicine

## 2020-12-06 ENCOUNTER — Other Ambulatory Visit: Payer: Self-pay

## 2020-12-06 DIAGNOSIS — Z1231 Encounter for screening mammogram for malignant neoplasm of breast: Secondary | ICD-10-CM | POA: Diagnosis not present

## 2020-12-23 DIAGNOSIS — M25559 Pain in unspecified hip: Secondary | ICD-10-CM | POA: Diagnosis not present

## 2020-12-29 ENCOUNTER — Ambulatory Visit (INDEPENDENT_AMBULATORY_CARE_PROVIDER_SITE_OTHER): Payer: BC Managed Care – PPO | Admitting: Nurse Practitioner

## 2020-12-29 ENCOUNTER — Encounter: Payer: Self-pay | Admitting: Nurse Practitioner

## 2020-12-29 ENCOUNTER — Other Ambulatory Visit: Payer: Self-pay

## 2020-12-29 VITALS — BP 124/80 | Ht 62.5 in | Wt 228.0 lb

## 2020-12-29 DIAGNOSIS — F3281 Premenstrual dysphoric disorder: Secondary | ICD-10-CM

## 2020-12-29 DIAGNOSIS — Z01419 Encounter for gynecological examination (general) (routine) without abnormal findings: Secondary | ICD-10-CM | POA: Diagnosis not present

## 2020-12-29 DIAGNOSIS — Z780 Asymptomatic menopausal state: Secondary | ICD-10-CM

## 2020-12-29 NOTE — Progress Notes (Signed)
Jordan Vang [birth year] [id number]   History:  55 y.o. G1P1001 presents for annual exam. Postmenopausal - no HRT, no bleeding. Normal pap and mammogram history. History of basal cell carcinoma, hypothyroidism, HLD, PMDD. PCP managing depression and recently added Wellbutrin, she also takes Zoloft every other day. She has had some personal stressors with her job change and thinks it could be stemming from that. She is wondering if HRT can help with her depression.   Gynecologic History Patient's last menstrual period was 08/27/2016.   Contraception/Family planning: post menopausal status Sexually active: Yes  Health Maintenance Last Pap: 10/31/2018. Results were: Normal, 5-year repeat Last mammogram: 12/06/2020. Results were: Normal Last colonoscopy: 10/20/2015. Results were: Normal, 10-year recall Last Dexa: Not indicated   Past medical history, past surgical history, family history and social history were all reviewed and documented in the EPIC chart. Married. 56 yo son at [employer] [place] for [employer].   ROS:  A ROS was performed and pertinent positives and negatives are included.  Exam:  Vitals:   12/29/20 1131  BP: 124/80  Weight: 228 lb (103.4 kg)  Height: 5' 2.5" (1.588 m)   Body mass index is 41.04 kg/m.  General appearance:  Normal Thyroid:  Symmetrical, normal in size, without palpable masses or nodularity. Respiratory  Auscultation:  Clear without wheezing or rhonchi Cardiovascular  Auscultation:  Regular rate, without rubs, murmurs or gallops  Edema/varicosities:  Not grossly evident Abdominal  Soft,nontender, without masses, guarding or rebound.  Liver/spleen:  No organomegaly noted  Hernia:  None appreciated  Skin  Inspection:  Grossly normal Breasts: Examined lying and sitting.   Right: Without masses, retractions, nipple discharge or axillary adenopathy.   Left: Without masses, retractions, nipple discharge or axillary adenopathy. Genitourinary    Inguinal/mons:  Normal without inguinal adenopathy  External genitalia:  Normal appearing vulva with no masses, tenderness, or lesions  BUS/Urethra/Skene's glands:  Normal  Vagina:  Normal appearing with normal color and discharge, no lesions  Cervix:  Normal appearing without discharge or lesions  Uterus:  Difficult to palpate due to body habitus but no gross masses or tenderness  Adnexa/parametria:     Rt: Normal in size, without masses or tenderness.   Lt: Normal in size, without masses or tenderness.  Anus and perineum: Normal  Digital rectal exam: Normal sphincter tone without palpated masses or tenderness  Patient informed chaperone available to be present for breast and pelvic exam. Patient has requested no chaperone to be present. Patient has been advised what will be completed during breast and pelvic exam.   Assessment/Plan:  55 y.o. G1P1001 for annual exam.   Well female exam with routine gynecological exam - Education provided on SBEs, importance of preventative screenings, current guidelines, high calcium diet, regular exercise, and multivitamin daily. Labs with PCP.  Postmenopausal - no HRT, no bleeding.   Premenstrual dysphoric disorder - has been on Zoloft for years but felt it was no longer effective and she was also worried about her weight gain. PCP added Wellbutrin in June and recommended taking Zoloft every other day. She is tearful during visit discussing her job change as she starts this very soon. She is also excited about the change but is still emotional about leaving her job after 11 years. We did talk about HRT and how it may help with some postmenopausal depressive symptoms. She plans to get adjusted with new job and if she is still experiencing depressive symptoms she wants to consider HRT.   Screening  for cervical cancer - Normal Pap history.  Will repeat at 5-year interval per guidelines.  Screening for breast cancer - Normal mammogram history.  Continue  annual screenings.  Normal breast exam today.  Screening for colon cancer - 2017 colonoscopy. Will repeat at GI's recommended interval.   Screening for osteoporosis - Will plan Dexa farther into menopause.   Return in 1 year for annual.   Tamela Gammon DNP, 12:37 PM 12/29/2020

## 2021-01-11 DIAGNOSIS — R2681 Unsteadiness on feet: Secondary | ICD-10-CM | POA: Diagnosis not present

## 2021-01-11 DIAGNOSIS — H8113 Benign paroxysmal vertigo, bilateral: Secondary | ICD-10-CM | POA: Diagnosis not present

## 2021-02-16 ENCOUNTER — Other Ambulatory Visit: Payer: Self-pay

## 2021-02-16 MED ORDER — SERTRALINE HCL 25 MG PO TABS: ORAL_TABLET | ORAL | 3 refills | Status: DC

## 2021-02-16 NOTE — Telephone Encounter (Signed)
Refill request for Sertaline received today.  AEX w TW on 12/29/20: "Premenstrual dysphoric disorder - has been on Zoloft for years but felt it was no longer effective and she was also worried about her weight gain. PCP added Wellbutrin in June and recommended taking Zoloft every other day."  I spoke with patient since refill request states "take one tablet po qd."  She confirmed she is taking it every other day.  She said she has plenty of Sertaline tabs because they have been filling Rx for twice as much as she is actually taking.  She asked if we can send new Rx with corrected directions to "Take one tab po every other day" and that will straighten out the issues with the pharmacy thinking she needs refills when she does note.

## 2021-03-07 ENCOUNTER — Ambulatory Visit: Payer: Self-pay | Admitting: Orthopaedic Surgery

## 2021-03-15 ENCOUNTER — Ambulatory Visit: Payer: Self-pay | Admitting: Orthopaedic Surgery

## 2021-03-22 ENCOUNTER — Encounter: Payer: Self-pay | Admitting: Orthopaedic Surgery

## 2021-03-22 ENCOUNTER — Ambulatory Visit: Payer: Self-pay

## 2021-03-22 ENCOUNTER — Ambulatory Visit (INDEPENDENT_AMBULATORY_CARE_PROVIDER_SITE_OTHER): Payer: No Typology Code available for payment source | Admitting: Orthopaedic Surgery

## 2021-03-22 ENCOUNTER — Other Ambulatory Visit: Payer: Self-pay

## 2021-03-22 ENCOUNTER — Ambulatory Visit: Payer: BC Managed Care – PPO | Admitting: Orthopaedic Surgery

## 2021-03-22 DIAGNOSIS — Z96642 Presence of left artificial hip joint: Secondary | ICD-10-CM | POA: Diagnosis not present

## 2021-03-22 NOTE — Progress Notes (Signed)
The patient is a very active and pleasant 55 year old female well-known to me.  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  She does feel that there is a slight leg length discrepancy and is wearing a lift in her right shoe.  She does still have some groin pain on the left side and I told her this is appropriate given her very young bone and nice muscle tone.  She does report that there are still symptoms and tingling but that is getting less.  Overall she feels like she is getting better.  On exam I had her lay in a supine position.  I do not feel there is a significant leg length discrepancy but I am totally listening to her on how she feels.  I feel like her leg lengths are close to equal.  Her left operative hip moves smoothly and fluidly and has full range of motion.  An AP pelvis and lateral of the left hip shows a well-seated total hip arthroplasty with no complicating features.  I gave her reassurance that so far I am pleased with how things are going and that hopefully with time her body will adjust and she will feel better overall.  I would like to see her back in 6 months.  At that visit I would like a standing low AP pelvis and I want her to have her shoes off to make sure her leg lengths are at a balanced and equal position.  All questions and concerns were answered and addressed.

## 2021-04-07 ENCOUNTER — Telehealth: Payer: Self-pay | Admitting: Family Medicine

## 2021-04-07 NOTE — Telephone Encounter (Signed)
Pt called in asking if Dr. Birdie Riddle would take her as a new pt, she states that her friend Adonis Brook recommended Birdie Riddle to her.  Please advise

## 2021-04-07 NOTE — Telephone Encounter (Signed)
Pt has been scheduled.  °

## 2021-04-07 NOTE — Telephone Encounter (Signed)
Ok to establish 

## 2021-04-26 ENCOUNTER — Telehealth: Payer: Self-pay | Admitting: Orthopaedic Surgery

## 2021-04-26 NOTE — Telephone Encounter (Signed)
Pt called stating she has a dentist appt next week and she had surgery a couple months ago. Pt would like to have the antibiotics called in and then would like to be notified when this has been done please.  [phone]

## 2021-04-26 NOTE — Telephone Encounter (Signed)
Patient aware not necessary after 3 months

## 2021-05-30 ENCOUNTER — Ambulatory Visit: Payer: No Typology Code available for payment source | Admitting: Family Medicine

## 2021-05-30 ENCOUNTER — Encounter: Payer: Self-pay | Admitting: Family Medicine

## 2021-05-30 VITALS — BP 140/82 | HR 70 | Temp 98.2°F | Resp 16 | Ht 62.5 in | Wt 223.0 lb

## 2021-05-30 DIAGNOSIS — M791 Myalgia, unspecified site: Secondary | ICD-10-CM

## 2021-05-30 DIAGNOSIS — G4720 Circadian rhythm sleep disorder, unspecified type: Secondary | ICD-10-CM | POA: Insufficient documentation

## 2021-05-30 DIAGNOSIS — T466X5A Adverse effect of antihyperlipidemic and antiarteriosclerotic drugs, initial encounter: Secondary | ICD-10-CM | POA: Insufficient documentation

## 2021-05-30 DIAGNOSIS — E039 Hypothyroidism, unspecified: Secondary | ICD-10-CM

## 2021-05-30 DIAGNOSIS — E785 Hyperlipidemia, unspecified: Secondary | ICD-10-CM | POA: Diagnosis not present

## 2021-05-30 DIAGNOSIS — M2550 Pain in unspecified joint: Secondary | ICD-10-CM

## 2021-05-30 DIAGNOSIS — F3340 Major depressive disorder, recurrent, in remission, unspecified: Secondary | ICD-10-CM

## 2021-05-30 DIAGNOSIS — N943 Premenstrual tension syndrome: Secondary | ICD-10-CM | POA: Insufficient documentation

## 2021-05-30 DIAGNOSIS — J309 Allergic rhinitis, unspecified: Secondary | ICD-10-CM | POA: Insufficient documentation

## 2021-05-30 DIAGNOSIS — R911 Solitary pulmonary nodule: Secondary | ICD-10-CM | POA: Insufficient documentation

## 2021-05-30 DIAGNOSIS — M1991 Primary osteoarthritis, unspecified site: Secondary | ICD-10-CM | POA: Insufficient documentation

## 2021-05-30 DIAGNOSIS — R400 Somnolence: Secondary | ICD-10-CM | POA: Insufficient documentation

## 2021-05-30 DIAGNOSIS — G4733 Obstructive sleep apnea (adult) (pediatric): Secondary | ICD-10-CM

## 2021-05-30 DIAGNOSIS — G894 Chronic pain syndrome: Secondary | ICD-10-CM | POA: Insufficient documentation

## 2021-05-30 LAB — CBC WITH DIFFERENTIAL/PLATELET
Basophils Absolute: 0.1 10*3/uL (ref 0.0–0.1)
Basophils Relative: 0.8 % (ref 0.0–3.0)
Eosinophils Absolute: 0.1 10*3/uL (ref 0.0–0.7)
Eosinophils Relative: 1.7 % (ref 0.0–5.0)
HCT: 39.1 % (ref 36.0–46.0)
Hemoglobin: 12.9 g/dL (ref 12.0–15.0)
Lymphocytes Relative: 28.1 % (ref 12.0–46.0)
Lymphs Abs: 1.9 10*3/uL (ref 0.7–4.0)
MCHC: 32.8 g/dL (ref 30.0–36.0)
MCV: 81.4 fl (ref 78.0–100.0)
Monocytes Absolute: 0.4 10*3/uL (ref 0.1–1.0)
Monocytes Relative: 5.9 % (ref 3.0–12.0)
Neutro Abs: 4.4 10*3/uL (ref 1.4–7.7)
Neutrophils Relative %: 63.5 % (ref 43.0–77.0)
Platelets: 263 10*3/uL (ref 150.0–400.0)
RBC: 4.81 Mil/uL (ref 3.87–5.11)
RDW: 14.9 % (ref 11.5–15.5)
WBC: 6.9 10*3/uL (ref 4.0–10.5)

## 2021-05-30 LAB — BASIC METABOLIC PANEL
BUN: 15 mg/dL (ref 6–23)
CO2: 31 mEq/L (ref 19–32)
Calcium: 9.6 mg/dL (ref 8.4–10.5)
Chloride: 105 mEq/L (ref 96–112)
Creatinine, Ser: 0.75 mg/dL (ref 0.40–1.20)
GFR: 89.11 mL/min (ref >60.00)
Glucose, Bld: 75 mg/dL (ref 70–99)
Potassium: 4 mEq/L (ref 3.5–5.1)
Sodium: 141 mEq/L (ref 135–145)

## 2021-05-30 LAB — LIPID PANEL
Cholesterol: 265 mg/dL — ABNORMAL HIGH (ref 0–200)
HDL: 45.6 mg/dL (ref >39.00)
NonHDL: 219.81
Total CHOL/HDL Ratio: 6
Triglycerides: 213 mg/dL — ABNORMAL HIGH (ref 0.0–149.0)
VLDL: 42.6 mg/dL — ABNORMAL HIGH (ref 0.0–40.0)

## 2021-05-30 LAB — VITAMIN D 25 HYDROXY (VIT D DEFICIENCY, FRACTURES): VITD: 27.52 ng/mL — ABNORMAL LOW (ref 30.00–100.00)

## 2021-05-30 LAB — TSH: TSH: 2.37 u[IU]/mL (ref 0.35–5.50)

## 2021-05-30 LAB — HEPATIC FUNCTION PANEL
ALT: 25 U/L (ref 0–35)
AST: 15 U/L (ref 0–37)
Albumin: 4.3 g/dL (ref 3.5–5.2)
Alkaline Phosphatase: 70 U/L (ref 39–117)
Bilirubin, Direct: 0.1 mg/dL (ref 0.0–0.3)
Total Bilirubin: 0.6 mg/dL (ref 0.2–1.2)
Total Protein: 6.7 g/dL (ref 6.0–8.3)

## 2021-05-30 LAB — LDL CHOLESTEROL, DIRECT: Direct LDL: 182 mg/dL

## 2021-05-30 LAB — CK: Total CK: 176 U/L (ref 7–177)

## 2021-05-30 MED ORDER — SERTRALINE HCL 50 MG PO TABS: 50.0000 mg | ORAL_TABLET | Freq: Every day | ORAL | 3 refills | Status: DC

## 2021-05-30 NOTE — Assessment & Plan Note (Signed)
New to provider, ongoing for pt.  Was previously on Lipitor but this was stopped when CK levels were elevated.  Will recheck lipid panel and CK level prior to restarting medication.  Pt expressed understanding and is in agreement w/ plan.

## 2021-05-30 NOTE — Assessment & Plan Note (Signed)
New to provider, ongoing for pt.  BMI 40.14.  She felt bullied by her previous PCP about her weight.  Discussed that it is very important and a large contributor to her overall health, but that I am not here to judge, or berate, or shame.  Encouraged low carb diet, regular physical activity, and to set small, manageable goals rather than stating 'I want to lose 50 lbs'.  Pt expressed understanding and is in agreement w/ plan.

## 2021-05-30 NOTE — Assessment & Plan Note (Signed)
Chronic problem.  Wearing CPAP as directed.

## 2021-05-30 NOTE — Assessment & Plan Note (Signed)
New to provider, ongoing for pt.  Currently on Levothyroxine 54mcg daily but reports feeling very poorly.  Low energy, body aches, joint pains.  Check labs.  Adjust meds prn

## 2021-05-30 NOTE — Progress Notes (Signed)
° °  Subjective:    Patient ID: Jordan Vang, female    DOB: [birth year], 56 y.o.   MRN: [id number]  HPI New to establish.  Previous MD- Varadarajan  Hypothyroid- chronic problem, currently on Levothyroxine 53mcg daily.  Pt reports decreased energy, 'everything hurts'.  Pt reports that daily around 2:00pm 'i feel like I have the flu'.  Hyperlipidemia- pt was previously on statin but this was stopped when CK levels were elevated.  Obesity- BMI 40.14.  Pt felt last doctor was 'borderline inappropriate' in pushing her about her weight.  Pt switched to a more active job in September.  She has not been able to exercise recently due to ongoing muscle/joint pain.  Currently working w/ dietician  Depression- chronic problem, on Sertraline 50mg  daily.  Pt feels sxs are currently controlled.  Did not do well on Wellbutrin previously.  Prozac caused upset stomach.  Lexapro caused extreme fatigue.  Previous MD recommended a psych eval which pt is not in favor of.  Health Maintenance- UTD on Tdap, pap, mammo, colonoscopy   Review of Systems For ROS see HPI   This visit occurred during the SARS-CoV-2 public health emergency.  Safety protocols were in place, including screening questions prior to the visit, additional usage of staff PPE, and extensive cleaning of exam room while observing appropriate contact time as indicated for disinfecting solutions.      Objective:   Physical Exam Vitals reviewed.  Constitutional:      General: She is not in acute distress.    Appearance: Normal appearance. She is well-developed. She is obese. She is not ill-appearing.  HENT:     Head: Normocephalic and atraumatic.  Eyes:     Conjunctiva/sclera: Conjunctivae normal.     Pupils: Pupils are equal, round, and reactive to light.  Neck:     Thyroid: No thyromegaly.  Cardiovascular:     Rate and Rhythm: Normal rate and regular rhythm.     Pulses: Normal pulses.     Heart sounds: Normal heart sounds. No murmur  heard. Pulmonary:     Effort: Pulmonary effort is normal. No respiratory distress.     Breath sounds: Normal breath sounds.  Abdominal:     General: There is no distension.     Palpations: Abdomen is soft.     Tenderness: There is no abdominal tenderness.  Musculoskeletal:     Cervical back: Normal range of motion and neck supple.     Right lower leg: No edema.     Left lower leg: No edema.  Lymphadenopathy:     Cervical: No cervical adenopathy.  Skin:    General: Skin is warm and dry.  Neurological:     General: No focal deficit present.     Mental Status: She is alert and oriented to person, place, and time.  Psychiatric:        Mood and Affect: Mood normal.        Behavior: Behavior normal.          Assessment & Plan:

## 2021-05-30 NOTE — Assessment & Plan Note (Signed)
New to provider, ongoing for pt.  She reports that mood is currently well controlled on Sertraline 50mg  daily.  Was previously on Wellbutrin, Prozac, and Lexapro w/o success.  No changes at this time

## 2021-05-30 NOTE — Assessment & Plan Note (Signed)
New to provider, ongoing for pt.  Unclear if this is thyroid related.  No longer statin related as she has been off medication for some time.  May be an autoimmune condition so will check ANA.  Pt expressed understanding and is in agreement w/ plan.

## 2021-05-30 NOTE — Patient Instructions (Addendum)
Schedule your complete physical in 6 months (sooner if needed) We'll notify you of your lab results and make any changes if needed Continue to work on healthy diet and regular exercise- you can do it!!! Call with any questions or concerns Stay Safe!  Stay Healthy! Welcome!  We're Glad To Have You!!!

## 2021-05-31 MED ORDER — ROSUVASTATIN CALCIUM 20 MG PO TABS: 20.0000 mg | ORAL_TABLET | Freq: Every day | ORAL | 3 refills | Status: DC

## 2021-05-31 MED ORDER — VITAMIN D (ERGOCALCIFEROL) 1.25 MG (50000 UNIT) PO CAPS: 50000.0000 [IU] | ORAL_CAPSULE | ORAL | 0 refills | Status: DC

## 2021-05-31 NOTE — Addendum Note (Signed)
Addended by: Midge Minium on: 05/31/2021 07:24 AM   Modules accepted: Orders

## 2021-06-01 ENCOUNTER — Encounter: Payer: Self-pay | Admitting: Family Medicine

## 2021-06-01 ENCOUNTER — Other Ambulatory Visit: Payer: Self-pay

## 2021-06-01 LAB — ANA: Anti Nuclear Antibody (ANA): NEGATIVE

## 2021-06-01 MED ORDER — LEVOTHYROXINE SODIUM 50 MCG PO TABS: 50.0000 ug | ORAL_TABLET | Freq: Every day | ORAL | 1 refills | Status: DC

## 2021-06-02 ENCOUNTER — Other Ambulatory Visit: Payer: Self-pay

## 2021-06-02 DIAGNOSIS — E785 Hyperlipidemia, unspecified: Secondary | ICD-10-CM

## 2021-06-02 DIAGNOSIS — M791 Myalgia, unspecified site: Secondary | ICD-10-CM

## 2021-06-30 ENCOUNTER — Other Ambulatory Visit (INDEPENDENT_AMBULATORY_CARE_PROVIDER_SITE_OTHER): Payer: No Typology Code available for payment source

## 2021-06-30 DIAGNOSIS — E785 Hyperlipidemia, unspecified: Secondary | ICD-10-CM

## 2021-06-30 DIAGNOSIS — M791 Myalgia, unspecified site: Secondary | ICD-10-CM

## 2021-06-30 LAB — HEPATIC FUNCTION PANEL
ALT: 24 U/L (ref 0–35)
AST: 18 U/L (ref 0–37)
Albumin: 4.5 g/dL (ref 3.5–5.2)
Alkaline Phosphatase: 67 U/L (ref 39–117)
Bilirubin, Direct: 0.1 mg/dL (ref 0.0–0.3)
Total Bilirubin: 0.6 mg/dL (ref 0.2–1.2)
Total Protein: 6.6 g/dL (ref 6.0–8.3)

## 2021-06-30 LAB — CK: Total CK: 170 U/L (ref 7–177)

## 2021-07-28 ENCOUNTER — Encounter: Payer: Self-pay | Admitting: Family Medicine

## 2021-09-12 ENCOUNTER — Ambulatory Visit: Payer: 59 | Admitting: Orthopaedic Surgery

## 2021-09-12 ENCOUNTER — Encounter: Payer: Self-pay | Admitting: Family Medicine

## 2021-09-12 ENCOUNTER — Encounter: Payer: Self-pay | Admitting: Orthopaedic Surgery

## 2021-10-03 ENCOUNTER — Ambulatory Visit (INDEPENDENT_AMBULATORY_CARE_PROVIDER_SITE_OTHER): Payer: 59

## 2021-10-03 ENCOUNTER — Ambulatory Visit (INDEPENDENT_AMBULATORY_CARE_PROVIDER_SITE_OTHER): Payer: 59 | Admitting: Orthopaedic Surgery

## 2021-10-03 ENCOUNTER — Encounter: Payer: Self-pay | Admitting: Orthopaedic Surgery

## 2021-10-03 DIAGNOSIS — Z96642 Presence of left artificial hip joint: Secondary | ICD-10-CM

## 2021-10-03 DIAGNOSIS — M2170 Unequal limb length (acquired), unspecified site: Secondary | ICD-10-CM | POA: Diagnosis not present

## 2021-10-03 NOTE — Progress Notes (Signed)
HPI: Ms. Gawlik returns today status post left total hip arthroplasty 08/02/2020.  She states overall that she is doing a lot better.  She is working with a Clinical research associate and feels that her overall range of motion and strength of the left hip have improved.  She has known osteoarthritis of the right hip which was moderate on MRI there is having no significant pain in the right hip.  Main complaint is leg length discrepancy.  She does have loops for her shoes.  She has also had severe plantar fasciitis which she has been treated for elsewhere.  Review of systems: See HPI  Physical exam: General well-developed well-nourished female no acute distress ambulates with a nonantalgic gait and no assistive device.  Bilateral hips good range of motion of both hips.  Left hip fluid motion without pain.  Left calf supple nontender.  Dorsiflexion plantarflexion left ankle intact  Radiographs: AP pelvis shows left total hip arthroplasty components to be well-seated.  No acute fractures no acute findings.  Mild to moderate arthritis right hip with periarticular spurring.  Impression: Status post left total hip arthroplasty 08/02/2020 Leg length discrepancy acquired  Plan: She is given a prescription for a lift for her right shoe.  She will follow-up with [place] as needed in regards to her left and right hip.  Currently she is having no pain in the right hip.  Questions were encouraged and answered at length today.

## 2021-10-30 ENCOUNTER — Encounter: Payer: Self-pay | Admitting: Family Medicine

## 2021-10-31 ENCOUNTER — Other Ambulatory Visit: Payer: Self-pay | Admitting: Family Medicine

## 2021-10-31 DIAGNOSIS — Z1231 Encounter for screening mammogram for malignant neoplasm of breast: Secondary | ICD-10-CM

## 2021-11-02 ENCOUNTER — Other Ambulatory Visit: Payer: 59

## 2021-11-16 ENCOUNTER — Ambulatory Visit: Payer: 59 | Admitting: Family Medicine

## 2021-11-16 ENCOUNTER — Encounter: Payer: Self-pay | Admitting: Family Medicine

## 2021-11-16 VITALS — BP 120/86 | HR 73 | Temp 98.4°F | Resp 20 | Wt 197.0 lb

## 2021-11-16 DIAGNOSIS — R5383 Other fatigue: Secondary | ICD-10-CM

## 2021-11-16 DIAGNOSIS — E785 Hyperlipidemia, unspecified: Secondary | ICD-10-CM

## 2021-11-16 DIAGNOSIS — M2550 Pain in unspecified joint: Secondary | ICD-10-CM

## 2021-11-16 LAB — CBC WITH DIFFERENTIAL/PLATELET
Basophils Absolute: 0 10*3/uL (ref 0.0–0.1)
Basophils Relative: 0.7 % (ref 0.0–3.0)
Eosinophils Absolute: 0.1 10*3/uL (ref 0.0–0.7)
Eosinophils Relative: 1.7 % (ref 0.0–5.0)
HCT: 41.7 % (ref 36.0–46.0)
Hemoglobin: 13.7 g/dL (ref 12.0–15.0)
Lymphocytes Relative: 31.1 % (ref 12.0–46.0)
Lymphs Abs: 2.3 10*3/uL (ref 0.7–4.0)
MCHC: 32.7 g/dL (ref 30.0–36.0)
MCV: 83.5 fl (ref 78.0–100.0)
Monocytes Absolute: 0.5 10*3/uL (ref 0.1–1.0)
Monocytes Relative: 7.4 % (ref 3.0–12.0)
Neutro Abs: 4.3 10*3/uL (ref 1.4–7.7)
Neutrophils Relative %: 59.1 % (ref 43.0–77.0)
Platelets: 258 10*3/uL (ref 150.0–400.0)
RBC: 5 Mil/uL (ref 3.87–5.11)
RDW: 14.6 % (ref 11.5–15.5)
WBC: 7.3 10*3/uL (ref 4.0–10.5)

## 2021-11-16 LAB — HEPATIC FUNCTION PANEL
ALT: 18 U/L (ref 0–35)
AST: 15 U/L (ref 0–37)
Albumin: 4.6 g/dL (ref 3.5–5.2)
Alkaline Phosphatase: 61 U/L (ref 39–117)
Bilirubin, Direct: 0.1 mg/dL (ref 0.0–0.3)
Total Bilirubin: 0.6 mg/dL (ref 0.2–1.2)
Total Protein: 7.4 g/dL (ref 6.0–8.3)

## 2021-11-16 LAB — B12 AND FOLATE PANEL
Folate: >24.2 ng/mL (ref >5.9)
Vitamin B-12: 353 pg/mL (ref 211–911)

## 2021-11-16 LAB — LIPID PANEL
Cholesterol: 260 mg/dL — ABNORMAL HIGH (ref 0–200)
HDL: 52.7 mg/dL (ref >39.00)
LDL Cholesterol: 180 mg/dL — ABNORMAL HIGH (ref 0–99)
NonHDL: 207.34
Total CHOL/HDL Ratio: 5
Triglycerides: 138 mg/dL (ref 0.0–149.0)
VLDL: 27.6 mg/dL (ref 0.0–40.0)

## 2021-11-16 LAB — BASIC METABOLIC PANEL
BUN: 25 mg/dL — ABNORMAL HIGH (ref 6–23)
CO2: 28 mEq/L (ref 19–32)
Calcium: 9.9 mg/dL (ref 8.4–10.5)
Chloride: 103 mEq/L (ref 96–112)
Creatinine, Ser: 0.75 mg/dL (ref 0.40–1.20)
GFR: 88.81 mL/min (ref >60.00)
Glucose, Bld: 84 mg/dL (ref 70–99)
Potassium: 4.2 mEq/L (ref 3.5–5.1)
Sodium: 138 mEq/L (ref 135–145)

## 2021-11-16 LAB — TSH: TSH: 2.69 u[IU]/mL (ref 0.35–5.50)

## 2021-11-16 LAB — VITAMIN D 25 HYDROXY (VIT D DEFICIENCY, FRACTURES): VITD: 49.27 ng/mL (ref 30.00–100.00)

## 2021-11-16 NOTE — Patient Instructions (Signed)
Follow up as needed or as scheduled We'll notify you of your lab results and make any changes if needed I'm SO proud of your weight loss efforts!  GOOD FOR YOU!! Wear the shoes w/ the external buildup If symptoms don't improve w/ the change in shoes, let me know and we can refer to sports med for a complete evaluation Call with any questions or concerns Hang in there!!

## 2021-11-16 NOTE — Progress Notes (Signed)
   Subjective:    Patient ID: Jordan Vang, female    DOB: 1965/04/24, 56 y.o.   MRN: [id number]  HPI Joint pain- pt reports she is exercising, losing weight, and eating right but she developed fairly significant joint pain that started in May.  Pt reports pain in knees, hips.  Pt says she was trying to figure out of this is systemic or orthopedic.  She has 'achiness when I don't take Advil'.  Finds that standing exacerbates pain.  Pt reports some decreased energy.  Pt reports she stopped the Crestor a few weeks ago due to muscle cramps.  Now taking Magnesium Glycinate and sxs have improved.  No fevers.  No redness/swelling joints.  Pt has a known 1/2" leg length difference s/p hip surgery   Review of Systems For ROS see HPI     Objective:   Physical Exam Vitals reviewed.  Constitutional:      General: She is not in acute distress.    Appearance: Normal appearance. She is obese. She is not ill-appearing.  HENT:     Head: Normocephalic and atraumatic.  Cardiovascular:     Rate and Rhythm: Normal rate and regular rhythm.  Pulmonary:     Effort: Pulmonary effort is normal. No respiratory distress.     Breath sounds: No wheezing.  Musculoskeletal:     Right lower leg: No edema.     Left lower leg: No edema.  Skin:    General: Skin is warm and dry.     Findings: No erythema or rash.  Neurological:     General: No focal deficit present.     Mental Status: She is alert and oriented to person, place, and time.  Psychiatric:     Comments: Tearful, anxious           Assessment & Plan:  Polyarthralgia- ongoing issue for pt.  She reports she has a known 1/2" leg length discrepancy since having hip surgery.  Suspect her leg length inequality is the cause of her predominately LE joint pains.  She just had her shoe fitted w/ an external lift and hopefully this will improve the situation.  If not, will refer to Sports Med for possible custom orthotics.  Pt expressed understanding and is in  agreement w/ plan.

## 2021-11-17 ENCOUNTER — Telehealth: Payer: Self-pay

## 2021-11-17 ENCOUNTER — Other Ambulatory Visit: Payer: Self-pay

## 2021-11-17 MED ORDER — ROSUVASTATIN CALCIUM 20 MG PO TABS: 20.0000 mg | ORAL_TABLET | Freq: Every day | ORAL | 3 refills | Status: DC

## 2021-11-17 NOTE — Telephone Encounter (Signed)
Left pt a vm in regards to lab results . Advised we sent in Crestor 20 mg

## 2021-11-17 NOTE — Telephone Encounter (Signed)
-----   Message from Midge Minium, MD sent at 11/17/2021 12:39 PM EDT ----- Labs look great w/ exception of total cholesterol and LDL (bad cholesterol) which are both well above normal.  Based on this, I would restart the Crestor '20mg'$  daily.

## 2021-11-19 NOTE — Assessment & Plan Note (Signed)
Chronic problem.  Pt stopped her Crestor a few weeks ago due to muscle cramps and joint pains.  Her sxs did not necessarily improve once she stopped the medication.  Discussed that she will likely need to restart her medication due to her high cholesterol levels but we will check labs first to assess.

## 2021-11-19 NOTE — Assessment & Plan Note (Signed)
Ongoing issue for pt.  Suspect her sxs are mood and pain related but will check labs to assess for underlying cause.

## 2021-11-28 ENCOUNTER — Ambulatory Visit (INDEPENDENT_AMBULATORY_CARE_PROVIDER_SITE_OTHER): Payer: 59 | Admitting: Family Medicine

## 2021-11-28 ENCOUNTER — Encounter: Payer: No Typology Code available for payment source | Admitting: Family Medicine

## 2021-11-28 ENCOUNTER — Encounter: Payer: Self-pay | Admitting: Family Medicine

## 2021-11-28 DIAGNOSIS — Z0000 Encounter for general adult medical examination without abnormal findings: Secondary | ICD-10-CM | POA: Insufficient documentation

## 2021-11-28 MED ORDER — MELOXICAM 15 MG PO TABS: 15.0000 mg | ORAL_TABLET | Freq: Every day | ORAL | 1 refills | Status: DC

## 2021-11-28 NOTE — Patient Instructions (Signed)
Follow up in 6 months to recheck cholesterol No need to repeat labs today- yay!!! Continue to work on healthy diet and regular exercise- you can do it! Call [facility] to schedule an appt START the Meloxicam once daily- take w/ food Use Tylenol as needed for breakthrough aches and pains Call with any questions or concerns Stay Safe!  Stay Healthy! Enjoy the rest of your summer!!!

## 2021-11-28 NOTE — Progress Notes (Signed)
   Subjective:    Patient ID: Jordan Vang, female    DOB: Jul 09, 1965, 56 y.o.   MRN: [id number]  HPI CPE- UTD on mammogram, pap, colonoscopy, Tdap  Patient Care Team    Relationship Specialty Notifications Start End  Midge Minium, MD PCP - General Family Medicine  05/30/21   Tamela Gammon, NP Nurse Practitioner Gynecology  05/30/21   Carlos Levering, PA-C Physician Assistant Sleep Medicine  05/30/21   Mcarthur Rossetti, MD Consulting Physician Orthopedic Surgery  05/30/21   Marygrace Drought, MD Consulting Physician Ophthalmology  05/30/21     Health Maintenance  Topic Date Due   INFLUENZA VACCINE  11/21/2021   Hepatitis C Screening  11/17/2022 (Originally 02/28/1983)   HIV Screening  11/17/2022 (Originally 02/28/1980)   MAMMOGRAM  12/07/2022   TETANUS/TDAP  07/29/2023   PAP SMEAR-Modifier  10/31/2023   COLONOSCOPY (Pts 45-36yr Insurance coverage will need to be confirmed)  10/19/2025   COVID-19 Vaccine  Completed   Zoster Vaccines- Shingrix  Completed   HPV VACCINES  Aged Out      Review of Systems Patient reports no vision/ hearing changes, adenopathy,fever, weight change,  persistant/recurrent hoarseness , swallowing issues, chest pain, palpitations, edema, persistant/recurrent cough, hemoptysis, dyspnea (rest/exertional/paroxysmal nocturnal), gastrointestinal bleeding (melena, rectal bleeding), abdominal pain, significant heartburn, bowel changes, GU symptoms (dysuria, hematuria, incontinence), Gyn symptoms (abnormal  bleeding, pain),  syncope, focal weakness, memory loss, numbness & tingling, skin/hair/nail changes, abnormal bruising or bleeding, anxiety, or depression.     Objective:   Physical Exam General Appearance:    Alert, cooperative, no distress, appears stated age  Head:    Normocephalic, without obvious abnormality, atraumatic  Eyes:    PERRL, conjunctiva/corneas clear, EOM's intact both eyes  Ears:    Normal TM's and external ear canals, both ears   Nose:   Nares normal, septum midline, mucosa normal, no drainage    or sinus tenderness  Throat:   Lips, mucosa, and tongue normal; teeth and gums normal  Neck:   Supple, symmetrical, trachea midline, no adenopathy;    Thyroid: no enlargement/tenderness/nodules  Back:     Symmetric, no curvature, ROM normal, no CVA tenderness  Lungs:     Clear to auscultation bilaterally, respirations unlabored  Chest Wall:    No tenderness or deformity   Heart:    Regular rate and rhythm, S1 and S2 normal, no murmur, rub   or gallop  Breast Exam:    Deferred to GYN  Abdomen:     Soft, non-tender, bowel sounds active all four quadrants,    no masses, no organomegaly  Genitalia:    Deferred to GYN  Rectal:    Extremities:   Extremities normal, atraumatic, no cyanosis or edema  Pulses:   2+ and symmetric all extremities  Skin:   Skin color, texture, turgor normal, no rashes or lesions  Lymph nodes:   Cervical, supraclavicular, and axillary nodes normal  Neurologic:   CNII-XII intact, normal strength, sensation and reflexes    throughout          Assessment & Plan:

## 2021-11-28 NOTE — Assessment & Plan Note (Signed)
Pt's PE WNL w/ exception of BMI.  UTD on pap, mammo, colonoscopy, Tdap, shingles.  Reviewed recent labs.  No need to repeat.  Anticipatory guidance provided.

## 2021-12-05 ENCOUNTER — Encounter: Payer: Self-pay | Admitting: Sports Medicine

## 2021-12-05 ENCOUNTER — Ambulatory Visit: Payer: 59 | Admitting: Sports Medicine

## 2021-12-05 VITALS — BP 144/73 | Ht 63.0 in | Wt 197.0 lb

## 2021-12-05 DIAGNOSIS — M25551 Pain in right hip: Secondary | ICD-10-CM | POA: Insufficient documentation

## 2021-12-05 DIAGNOSIS — M25561 Pain in right knee: Secondary | ICD-10-CM | POA: Diagnosis not present

## 2021-12-05 DIAGNOSIS — G8929 Other chronic pain: Secondary | ICD-10-CM

## 2021-12-05 DIAGNOSIS — M2170 Unequal limb length (acquired), unspecified site: Secondary | ICD-10-CM | POA: Diagnosis not present

## 2021-12-05 DIAGNOSIS — M25562 Pain in left knee: Secondary | ICD-10-CM | POA: Diagnosis not present

## 2021-12-05 NOTE — Progress Notes (Unsigned)
SUBJECTIVE:   CHIEF COMPLAINT / HPI:   Jordan Vang is a pleasant 56 year old woman who presents today to sports medicine clinic for bilateral knee and right hip pain.  She is referred by her PCP, Dr. Annye Asa.  Jordan Vang reports she developed left hip pain in summer 2021, was found to have severe OA, and underwent a hip replacement in April 2022 with Dr. Ninfa Linden.  She reports that immediately after the surgery, she felt a leg length discrepancy, however was told to give it about a year for healing and inflammation to resolve.  She reports the feeling of leg length discrepancy to persist after the year mark.  As part of her recovery for the left hip replacement, she had been doing physical therapy, dry needling, and PT Pilates.  Earlier this spring, she started on with a personal trainer 3 times a week to work on Editor, commissioning.  She has also been experimenting with different shoe inserts for both the leg length discrepancy and Planter fasciitis.  Currently, she is using a pair of [employer] that have had 1/2 inch foam sole insert secured into the rubber sole.  She reports worsening pain of her bilateral knees (R>L) and right hip since about May.  Her left knee has a dull achy pain "that feels more like arthritis".  The right knee has intermittent small sharp stabbing pains "that feel more like a muscle or tendon".  The right hip feels as though it has a deep dull ache, and the patient has noticed loss of internal rotation on the right.  The right lateral plantar surface is also intermittently numb with walking.  She wears a knee brace with variable results.  She reports difficulty straightening her knees.  The knee pain does wake her up from sleep from time to time.  She went to her PCP in late July for this, was given meloxicam provided improvement mainly for the hip and less for the knees.  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, right knee cartilage  removal 2015, hypothyroidism, HLD, OSA, B12 deficiency  OBJECTIVE:   BP (!) 144/73   Ht '5\' 3"'$  (1.6 m)   Wt 197 lb (89.4 kg)   LMP 08/27/2016   BMI 34.90 kg/m    Gait: Obvious leg length discrepancy (left longer) with associated shoulder drop, venu valgum noted while standing and walking  Left leg 84 cm in length Right leg 83 cm in length  Knees Appear symmetric to inspection, no gross deformities or swelling Right leg externally rotated at rest, left leg in neutral position Palpation of bilateral knees unremarkable, no soft tissue swelling palpated No tenderness to palpation bilaterally of joint lines, patellar tendons, quad tendons, or posterior knee Left Left knee with good patellar tracking and no crepitus, no patellar apprehension Joint stable, anterior/posterior drawer negative No pain with varus/valgus stress or inversion/eversion Thessaly positive  Right Right knee with palpable patellar crepitus, no patellar apprehension Joint stable, anterior/posterior drawer negative Slight medial pain with varus stress No other pain with valgus stress or inversion/eversion Thessaly negative  Hips Symmetric to inspection, no gross deformities Palpation unremarkable bilaterally, no soft tissue swelling appreciated, pelvis even Right leg externally rotated at rest, left leg in neutral position Negative straight leg raise bilaterally FADIR/FABER negative bilaterally Mildly painful internal rotation of right leg/hip  Standing pelvis x-ray completed 10/03/2021 by Dr. Ninfa Linden independently reviewed.  Agree with his read of mild to moderate arthritis in  the right with periarticular spurring.  ASSESSMENT/PLAN:   Chronic pain of both knees Likely arthritis exacerbated by leg length discrepancy and increased physical activity, strength training.  Recommend bilateral knee x-ray AP/standing/sunrise to assess degree of osteoarthritis.  We will send to physical therapy for both knees and  right hip.  Plan to continue with personal trainer with several adjustments, mainly reducing weightbearing exercises on flexed knee such as lunges.  We will call to discuss results of x-ray with her once these come back.  Follow-up in 4 to 6 weeks.  May continue with meloxicam if she finds this helpful.  Right hip pain Standing pelvic x-ray findings and loss of internal rotation of right hip strongly suggests arthritis.  Discussed with patient, will send for physical therapy of both right hip and bilateral knees.  Continue meloxicam if helpful, follow-up in 4 to 6 weeks.    Ezequiel Essex, MD [place]   Patient seen and evaluated with the resident.  I agree with the above plan of care.  I suspect the pain in her knees is from arthritis.  Would like to order some x-rays to evaluate further.  I would also like for her to start physical therapy with the goal being for her to eventually incorporate some of those exercises into her weekly workout routine.  I explained that the right hip pain that she is experiencing is from osteoarthritis.  She understands that.  For now, she will continue with her current shoes.  She has a buildup on the right shoe to help correct her leg length discrepancy which is approximately one half an inch.  Combination of this shoe and some off-the-shelf inserts has resolved her Planter fasciitis so I am going to hold on making any orthotic adjustments at this time.  She will follow-up with me again in 4 to 6 weeks.  This note was dictated using Dragon naturally speaking software and may contain errors in syntax, spelling, or content which have not been identified prior to signing this note.

## 2021-12-05 NOTE — Assessment & Plan Note (Addendum)
Likely arthritis exacerbated by leg length discrepancy and increased physical activity, strength training.  Recommend bilateral knee x-ray AP/standing/sunrise to assess degree of osteoarthritis.  We will send to physical therapy for both knees and right hip.  Plan to continue with personal trainer with several adjustments, mainly reducing weightbearing exercises on flexed knee such as lunges.  We will call to discuss results of x-ray with her once these come back.  Follow-up in 4 to 6 weeks.  May continue with meloxicam if she finds this helpful.

## 2021-12-05 NOTE — Assessment & Plan Note (Addendum)
Standing pelvic x-ray findings and loss of internal rotation of right hip strongly suggests arthritis.  Discussed with patient, will send for physical therapy of both right hip and bilateral knees.  Continue meloxicam if helpful, follow-up in 4 to 6 weeks.

## 2021-12-06 ENCOUNTER — Ambulatory Visit
Admission: RE | Admit: 2021-12-06 | Discharge: 2021-12-06 | Disposition: A | Discharge status: Home / Self Care | Payer: 59 | Source: Ambulatory Visit | Attending: Sports Medicine | Admitting: Sports Medicine

## 2021-12-06 DIAGNOSIS — M25561 Pain in right knee: Secondary | ICD-10-CM

## 2021-12-11 ENCOUNTER — Telehealth: Payer: Self-pay | Admitting: Sports Medicine

## 2021-12-11 NOTE — Telephone Encounter (Signed)
  I spoke with Jordan Vang on the phone today after reviewing x-rays of both knees.  She has moderate degenerative changes in the patellofemoral joint.  I recommended that she avoid any sort of weightbearing exercise that involves deep knee bending.  She is currently taking meloxicam and has found that to be helpful.  However, she has noticed her blood pressure to be slightly elevated.  We discussed a short term of discontinuing the medicine and rechecking her blood pressure in 3 to 4 days.  She also understands that this is not a medicine to be taken daily indefinitely.  She will follow-up with me as scheduled in September and I will review her x-rays with her at that time.

## 2021-12-12 ENCOUNTER — Ambulatory Visit
Admission: RE | Admit: 2021-12-12 | Discharge: 2021-12-12 | Disposition: A | Discharge status: Home / Self Care | Payer: 59 | Source: Ambulatory Visit | Attending: Family Medicine | Admitting: Family Medicine

## 2021-12-12 DIAGNOSIS — Z1231 Encounter for screening mammogram for malignant neoplasm of breast: Secondary | ICD-10-CM

## 2021-12-18 ENCOUNTER — Encounter: Payer: Self-pay | Admitting: Sports Medicine

## 2022-01-02 ENCOUNTER — Ambulatory Visit (INDEPENDENT_AMBULATORY_CARE_PROVIDER_SITE_OTHER): Payer: 59 | Admitting: Nurse Practitioner

## 2022-01-02 ENCOUNTER — Encounter: Payer: Self-pay | Admitting: Nurse Practitioner

## 2022-01-02 ENCOUNTER — Ambulatory Visit (INDEPENDENT_AMBULATORY_CARE_PROVIDER_SITE_OTHER): Payer: 59 | Admitting: Sports Medicine

## 2022-01-02 VITALS — BP 92/60 | HR 64 | Ht 62.5 in | Wt 193.0 lb

## 2022-01-02 VITALS — BP 110/70 | Ht 63.0 in

## 2022-01-02 DIAGNOSIS — Z780 Asymptomatic menopausal state: Secondary | ICD-10-CM | POA: Diagnosis not present

## 2022-01-02 DIAGNOSIS — M25561 Pain in right knee: Secondary | ICD-10-CM

## 2022-01-02 DIAGNOSIS — Z01419 Encounter for gynecological examination (general) (routine) without abnormal findings: Secondary | ICD-10-CM | POA: Diagnosis not present

## 2022-01-02 DIAGNOSIS — Z8262 Family history of osteoporosis: Secondary | ICD-10-CM

## 2022-01-02 NOTE — Progress Notes (Signed)
   Subjective:    Patient ID: Jordan Vang, female    DOB: 25-Jul-1965, 56 y.o.   MRN: [id number]  HPI  Jordan Vang presents today for follow-up on bilateral knee pain.  She had messaged me previously about acute right knee pain that began at church.  She had excruciating pain which made it difficult to bear weight.  She also experienced some increased stiffness at that time.  After icing and meloxicam her pain did improved although it has not completely resolved.  Today, her left knee is the more painful knee.  She slipped yesterday twisting the knee and now has pain throughout the knee.  She has not noticed any swelling.  She does endorse a feeling of giving way in both knees.  She has been doing water exercises after an initial PT visit.  This is going well.  She is here today with her husband.    Review of Systems As above    Objective:   Physical Exam  Well-developed, well-nourished.  No acute distress  Right knee: Good range of motion.  No obvious effusion.  She is tender to palpation along medial and lateral joint lines.  Pain with Thessaly's.  Knee is grossly stable ligamentous exam.  Neurovascular intact distally.  Left knee: Full range of motion.  No effusion.  Tender to palpation along medial and lateral joint lines.  Equivocal Thessaly's.  Neurovascular intact distally.  X-rays of both knees show mild degenerative changes, primarily at the patellofemoral joint      Assessment & Plan:   Bilateral knee pain, right greater than left, secondary to DJD versus meniscal tear  Patient continues to have pain in both knees but the right knee pain is more chronic.  She has had physical therapy and is doing water exercises on her own.  She is also taking meloxicam daily which is helpful.  I cautioned her about the daily use of NSAIDs and potential side effects.  I would like to get an MRI of her right knee specifically to rule out a meniscus tear given the fact that she only has mild  degenerative changes on her x-ray.  Phone follow-up with those results when available.  We will delineate further treatment based on those findings.  This note was dictated using Dragon naturally speaking software and may contain errors in syntax, spelling, or content which have not been identified prior to signing this note.

## 2022-01-02 NOTE — Progress Notes (Signed)
   Jordan Vang 1965-10-15 [id number]   History:  56 y.o. G1P1001 presents for annual exam. Postmenopausal - no HRT, no bleeding. Normal pap and mammogram history. History of basal cell carcinoma, hypothyroidism, HLD, PMDD.  Gynecologic History Patient's last menstrual period was 08/27/2016.   Contraception/Family planning: post menopausal status Sexually active: Yes  Health Maintenance Last Pap: 10/31/2018. Results were: Normal neg HPV, 5-year repeat Last mammogram: 12/12/2021. Results were: Normal Last colonoscopy: 10/20/2015. Results were: Normal, 10-year recall Last Dexa: Never  Past medical history, past surgical history, family history and social history were all reviewed and documented in the EPIC chart. 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 31 yo son graduated from [employer] for [employer], just moved to [place]. Mother diagnosed with breast cancer at age 44, osteoporosis.   ROS:  A ROS was performed and pertinent positives and negatives are included.  Exam:  Vitals:   01/02/22 1057  BP: 92/60  Pulse: 64  SpO2: 98%  Weight: 193 lb (87.5 kg)  Height: 5' 2.5" (1.588 m)    Body mass index is 34.74 kg/m.  General appearance:  Normal Thyroid:  Symmetrical, normal in size, without palpable masses or nodularity. Respiratory  Auscultation:  Clear without wheezing or rhonchi Cardiovascular  Auscultation:  Regular rate, without rubs, murmurs or gallops  Edema/varicosities:  Not grossly evident Abdominal  Soft,nontender, without masses, guarding or rebound.  Liver/spleen:  No organomegaly noted  Hernia:  None appreciated  Skin  Inspection:  Grossly normal Breasts: Examined lying and sitting.   Right: Without masses, retractions, nipple discharge or axillary adenopathy.   Left: Without masses, retractions, nipple discharge or axillary adenopathy. Genitourinary   Inguinal/mons:  Normal without inguinal adenopathy  External genitalia:  Normal appearing vulva with no  masses, tenderness, or lesions  BUS/Urethra/Skene's glands:  Normal  Vagina:  Normal appearing with normal color and discharge, no lesions  Cervix:  Normal appearing without discharge or lesions  Uterus:  Difficult to palpate due to body habitus but no gross masses or tenderness  Adnexa/parametria:     Rt: Normal in size, without masses or tenderness.   Lt: Normal in size, without masses or tenderness.  Anus and perineum: Normal  Digital rectal exam: Normal sphincter tone without palpated masses or tenderness  Patient informed chaperone available to be present for breast and pelvic exam. Patient has requested no chaperone to be present. Patient has been advised what will be completed during breast and pelvic exam.   Assessment/Plan:  56 y.o. G1P1001 for annual exam.   Well female exam with routine gynecological exam - Education provided on SBEs, importance of preventative screenings, current guidelines, high calcium diet, regular exercise, and multivitamin daily. Labs with PCP.  Postmenopausal - Plan: DG Bone Density. No HRT, no bleeding  Family history of osteoporosis in mother - Plan: DG Bone Density. Will schedule DXA now.   Screening for cervical cancer - Normal Pap history.  Will repeat at 5-year interval per guidelines.  Screening for breast cancer - Normal mammogram history.  Continue annual screenings.  Normal breast exam today.  Screening for colon cancer - 2017 colonoscopy. Will repeat at GI's recommended interval.   Return in 1 year for annual.     Tamela Gammon DNP, 11:27 AM 01/02/2022

## 2022-01-05 ENCOUNTER — Encounter: Payer: Self-pay | Admitting: Sports Medicine

## 2022-01-05 DIAGNOSIS — G8929 Other chronic pain: Secondary | ICD-10-CM

## 2022-01-09 ENCOUNTER — Ambulatory Visit: Payer: 59 | Admitting: Sports Medicine

## 2022-01-10 ENCOUNTER — Other Ambulatory Visit: Payer: 59

## 2022-01-10 ENCOUNTER — Inpatient Hospital Stay: Admission: RE | Admit: 2022-01-10 | Payer: 59 | Source: Ambulatory Visit

## 2022-01-16 ENCOUNTER — Ambulatory Visit
Admission: RE | Admit: 2022-01-16 | Discharge: 2022-01-16 | Disposition: A | Discharge status: Home / Self Care | Payer: 59 | Source: Ambulatory Visit | Attending: Sports Medicine | Admitting: Sports Medicine

## 2022-01-16 ENCOUNTER — Other Ambulatory Visit: Payer: 59

## 2022-01-16 DIAGNOSIS — M25561 Pain in right knee: Secondary | ICD-10-CM

## 2022-01-18 ENCOUNTER — Telehealth: Payer: Self-pay | Admitting: Sports Medicine

## 2022-01-18 NOTE — Telephone Encounter (Signed)
  I spoke with Jordan Vang on the phone today after reviewing MRI findings of her right knee.  MRI shows moderate to severe patellofemoral osteoarthritis with a very small loose body.  Based on these findings I recommended consultation with Dr. Ninfa Linden.  She may ultimately need a total knee arthroplasty and she has seen him in the past for a total hip arthroplasty.  Of note, the patient has been doing several weeks of physical therapy for both her left and right knee.  MRI of her left knee was recently ordered after she failed to respond to this.  Results of that MRI are pending at the time of this dictation.  This note was dictated using Dragon naturally speaking software and may contain errors in syntax, spelling, or content which have not been identified prior to signing this note.

## 2022-01-20 ENCOUNTER — Other Ambulatory Visit: Payer: 59

## 2022-01-30 ENCOUNTER — Other Ambulatory Visit: Payer: Self-pay | Admitting: Nurse Practitioner

## 2022-01-30 ENCOUNTER — Ambulatory Visit (INDEPENDENT_AMBULATORY_CARE_PROVIDER_SITE_OTHER): Payer: 59

## 2022-01-30 DIAGNOSIS — Z8262 Family history of osteoporosis: Secondary | ICD-10-CM | POA: Diagnosis not present

## 2022-01-30 DIAGNOSIS — Z13820 Encounter for screening for osteoporosis: Secondary | ICD-10-CM

## 2022-01-30 DIAGNOSIS — Z780 Asymptomatic menopausal state: Secondary | ICD-10-CM | POA: Diagnosis not present

## 2022-01-31 ENCOUNTER — Ambulatory Visit
Admission: RE | Admit: 2022-01-31 | Discharge: 2022-01-31 | Disposition: A | Discharge status: Home / Self Care | Payer: 59 | Source: Ambulatory Visit | Attending: Sports Medicine | Admitting: Sports Medicine

## 2022-01-31 DIAGNOSIS — G8929 Other chronic pain: Secondary | ICD-10-CM

## 2022-02-01 ENCOUNTER — Encounter: Payer: Self-pay | Admitting: Sports Medicine

## 2022-02-06 ENCOUNTER — Other Ambulatory Visit: Payer: Self-pay | Admitting: Family Medicine

## 2022-02-06 ENCOUNTER — Other Ambulatory Visit: Payer: Self-pay | Admitting: Lab

## 2022-02-06 DIAGNOSIS — E039 Hypothyroidism, unspecified: Secondary | ICD-10-CM

## 2022-02-06 MED ORDER — LEVOTHYROXINE SODIUM 50 MCG PO TABS: 50.0000 ug | ORAL_TABLET | Freq: Every day | ORAL | 1 refills | Status: DC

## 2022-02-06 NOTE — Telephone Encounter (Signed)
Called pt and informed her I sent in her meds

## 2022-02-12 ENCOUNTER — Encounter: Payer: Self-pay | Admitting: Orthopaedic Surgery

## 2022-02-12 ENCOUNTER — Ambulatory Visit (INDEPENDENT_AMBULATORY_CARE_PROVIDER_SITE_OTHER): Payer: 59 | Admitting: Orthopaedic Surgery

## 2022-02-12 VITALS — Ht 62.5 in | Wt 194.0 lb

## 2022-02-12 DIAGNOSIS — M1711 Unilateral primary osteoarthritis, right knee: Secondary | ICD-10-CM

## 2022-02-12 DIAGNOSIS — M2170 Unequal limb length (acquired), unspecified site: Secondary | ICD-10-CM | POA: Diagnosis not present

## 2022-02-12 DIAGNOSIS — M1712 Unilateral primary osteoarthritis, left knee: Secondary | ICD-10-CM

## 2022-02-12 DIAGNOSIS — M1611 Unilateral primary osteoarthritis, right hip: Secondary | ICD-10-CM

## 2022-02-12 NOTE — Progress Notes (Signed)
Office Visit Note   Patient: Jordan Vang           Date of Birth: 03/07/1966           MRN: [id number] Visit Date: 02/12/2022              Requested by: Midge Minium, MD [address] [place],  [place] [address] PCP: Midge Minium, MD   Assessment & Plan: Visit Diagnoses:  1. Leg length difference, acquired   2. Primary osteoarthritis of left knee   3. Primary osteoarthritis of right knee   4. Primary osteoarthritis of right hip     Plan: Given prior MRI that showed moderate arthritis involving the right hip done in 2022 and her worsening hip pain and decreased range of motion recommend right total hip arthroplasty sometime in the future.  Questions encouraged and answered by Dr. Ninfa Linden myself.  In regards to her knees we will try cortisone injections both knees she may benefit from supplemental injections both knees.  Most of her arthritis by MRI was patellofemoral joint.  Discussed with her knee friendly exercises and stay away from deep squats lunges for exercise.  She was recently given COVID injection and therefore would recommend waiting at least 10 days she will follow-up with [place] this Thursday for just injections with cortisone in both knees.  Follow-Up Instructions: Return in about 3 days (around 02/15/2022).   Orders:  No orders of the defined types were placed in this encounter.  No orders of the defined types were placed in this encounter.     Procedures: No procedures performed   Clinical Data: No additional findings.   Subjective: No chief complaint on file.   HPI Jordan Vang comes in today for bilateral knee pain.  She also is having increased right hip pain.  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  States left hip is overall doing well.  Both knees not bothering her as bad as they have recently.  Her main complaint today though is right hip pain.  She is having groin pain and buttocks pain decreased range of motion.  MRI  January 2022 showed moderate right hip arthritis.  Bilateral knee pain started after working out she developed achy pain in March.  Then saw Dr. Micheline Chapman in August and at that point time was taking Advil and doing pool therapy 2-3 times a week along with yoga.  She is also doing dry needling about her knees and hips.  She states she had to quit her part-time job due to the pain in her knees.  She began taking Mobic in August and this is helped with her knee pain. Cramps dated 12/08/2021 right knee showed moderate tricompartmental arthritis mostly involving the patellofemoral compartment.  No acute fractures.  Left knee radiographs dated 12/08/2021 showed tricompartmental arthritis again involving mainly the patellofemoral compartment.  No acute fractures. MRI left knee dated 01/31/2022 showed tricompartmental arthritic changes with areas of full-thickness loss involving the patellofemoral joint.  Medial compartment with high-grade partial-thickness cartilage loss in the weightbearing surface with a medial femoral condyle.  Lateral compartment partial thickness loss with areas of full-thickness cartilage loss.  No acute fractures.  Large radial tear. Right knee MRI dated 01/17/2022 showed moderate to severe patellofemoral arthritis.  Mild medial compartmental arthritic changes.  Lateral compartment with areas of full-thickness cartilage loss.  Calcified loose body located posterior aspect of the intercondylar fossa.  Review of Systems Denies any fevers chills.  Objective:  Vital Signs: Ht 5' 2.5" (1.588 m)   Wt 194 lb (88 kg)   LMP 08/27/2016   BMI 34.92 kg/m   Physical Exam Constitutional:      Appearance: She is not ill-appearing or diaphoretic.  Pulmonary:     Effort: Pulmonary effort is normal.  Neurological:     Mental Status: She is alert and oriented to person, place, and time.  Psychiatric:        Mood and Affect: Mood normal.     Ortho Exam Left hip full range of motion without pain.   Right hip decreased range of motion with external and internal rotation.  Tenderness over the right hip trochanteric region. Bilateral knees good range of motion of both knees.  Patellofemoral crepitus bilateral knees with passive range of motion.  No instability valgus varus stressing of either knee.  No abnormal warmth erythema or effusion of either knee. Specialty Comments:  No specialty comments available.  Imaging: No results found.   PMFS History: Patient Active Problem List   Diagnosis Date Noted   Chronic pain of both knees 12/05/2021   Right hip pain 12/05/2021   Acquired leg length discrepancy 12/05/2021   Physical exam 11/28/2021   Acquired hypothyroidism 05/30/2021   Adverse effect of antihyperlipidemic and antiarteriosclerotic drugs, initial encounter 05/30/2021   Allergic rhinitis 05/30/2021   Chronic pain syndrome 05/30/2021   Dyslipidemia 05/30/2021   Myalgia 05/30/2021   Primary osteoarthritis 05/30/2021   Recurrent major depression in remission ([place]) 05/30/2021   Solitary pulmonary nodule 05/30/2021   Sleep pattern disturbance 05/30/2021   OSA (obstructive sleep apnea) 05/30/2021   Status post left hip replacement 08/02/2020   B12 deficiency 11/22/2015   Fatigue 08/16/2015   Basal cell carcinoma, lip 10/28/2013   Morbid obesity ([place]) 10/28/2013   Past Medical History:  Diagnosis Date   Allergy    Anemia    Arthritis    Elevated cholesterol    Hypothyroidism    PMDD (premenstrual dysphoric disorder)    Dr Phineas Real   Sleep apnea    CPAP   Thyroid disease     Family History  Problem Relation Age of Onset   Hearing loss Mother    Depression Mother    Cancer Mother    Breast cancer Mother        Age 29   Lung cancer Mother        smoker   Osteoporosis Mother    Arthritis Father    Hyperlipidemia Father    Stroke Father 24   Hyperlipidemia Sister    Hyperlipidemia Brother    Heart attack Maternal Grandfather        < 65 ?   Diabetes Neg Hx     Colon cancer Neg Hx     Past Surgical History:  Procedure Laterality Date   BASAL CELL CARCINOMA EXCISION     Mohs   KNEE SURGERY Right 2015   TONSILLECTOMY     TOTAL HIP ARTHROPLASTY Left 08/02/2020   Procedure: LEFT TOTAL HIP ARTHROPLASTY ANTERIOR APPROACH;  Surgeon: Mcarthur Rossetti, MD;  Location: [facility];  Service: Orthopedics;  Laterality: Left;   Social History   Occupational History   Not on file  Tobacco Use   Smoking status: Never   Smokeless tobacco: Never  Vaping Use   Vaping Use: Never used  Substance and Sexual Activity   Alcohol use: Yes    Alcohol/week: 0.0 standard drinks of alcohol    Comment: socially   Drug use:  No   Sexual activity: Yes    Birth control/protection: Post-menopausal    Comment: intercourse age 23, sexual partners less than 5

## 2022-02-15 ENCOUNTER — Ambulatory Visit (INDEPENDENT_AMBULATORY_CARE_PROVIDER_SITE_OTHER): Payer: 59 | Admitting: Physician Assistant

## 2022-02-15 ENCOUNTER — Encounter: Payer: Self-pay | Admitting: Physician Assistant

## 2022-02-15 DIAGNOSIS — M1611 Unilateral primary osteoarthritis, right hip: Secondary | ICD-10-CM | POA: Diagnosis not present

## 2022-02-15 DIAGNOSIS — M1711 Unilateral primary osteoarthritis, right knee: Secondary | ICD-10-CM

## 2022-02-15 DIAGNOSIS — M1712 Unilateral primary osteoarthritis, left knee: Secondary | ICD-10-CM

## 2022-02-15 MED ORDER — METHYLPREDNISOLONE ACETATE 40 MG/ML IJ SUSP
40.0000 mg | INTRAMUSCULAR | Status: AC | PRN
  Administered 2022-02-15: 40 mg via INTRA_ARTICULAR

## 2022-02-15 MED ORDER — LIDOCAINE HCL 1 % IJ SOLN
3.0000 mL | INTRAMUSCULAR | Status: AC | PRN
  Administered 2022-02-15: 3 mL

## 2022-02-15 NOTE — Progress Notes (Signed)
   Procedure Note  Patient: Jordan Vang             Date of Birth: 11/04/1965           MRN: [id number]             Visit Date: 02/15/2022 HPI: Ms. discomfort comes in today for bilateral knee injections.  We saw her just recently and she had had COVID injection therefore wanted to wait at least 10 days from the time that she had had the vaccination.  She has had no new injuries to the knees.  No changes.  She does have questions in regards to her right hip which she has known mild to moderate arthritis on x-ray.  She is asking for possible MRI to better quantify the cartilage loss prior to scheduling hip replacement.  Review of systems: Negative for fevers chills.  Bilateral knees: Good range of motion of both knees.  No abnormal warmth or erythema.  Procedures: Visit Diagnoses:  1. Primary osteoarthritis of left knee   2. Primary osteoarthritis of right knee   3. Primary osteoarthritis of right hip     Large Joint Inj: bilateral knee on 02/15/2022 4:57 PM Indications: pain Details: 22 G 1.5 in needle, anterolateral approach  Arthrogram: No  Medications (Right): 3 mL lidocaine 1 %; 40 mg methylPREDNISolone acetate 40 MG/ML Medications (Left): 3 mL lidocaine 1 %; 40 mg methylPREDNISolone acetate 40 MG/ML Outcome: tolerated well, no immediate complications Procedure, treatment alternatives, risks and benefits explained, specific risks discussed. Consent was given by the patient. Immediately prior to procedure a time out was called to verify the correct patient, procedure, equipment, support staff and site/side marked as required. Patient was prepped and draped in the usual sterile fashion.      Plan: Given her plain films of the pelvis that showed mild to moderate arthritis's felt that it would be reasonable to get an MRI to better quantify the cartilage of the right hip.  We will have her undergo an MRI and then follow-up after the MRI to go over results discuss further treatment.   She tolerated the injections of both knees well today.

## 2022-02-16 NOTE — Addendum Note (Signed)
Addended by: Robyne Peers on: 02/16/2022 08:44 AM   Modules accepted: Orders

## 2022-02-22 ENCOUNTER — Encounter: Payer: Self-pay | Admitting: Orthopaedic Surgery

## 2022-02-23 ENCOUNTER — Encounter: Payer: Self-pay | Admitting: Family Medicine

## 2022-02-23 NOTE — Telephone Encounter (Signed)
Given sxs should she have a visit?

## 2022-02-25 ENCOUNTER — Ambulatory Visit
Admission: RE | Admit: 2022-02-25 | Discharge: 2022-02-25 | Disposition: A | Discharge status: Home / Self Care | Payer: 59 | Source: Ambulatory Visit | Attending: Physician Assistant | Admitting: Physician Assistant

## 2022-02-25 DIAGNOSIS — M1611 Unilateral primary osteoarthritis, right hip: Secondary | ICD-10-CM

## 2022-03-05 ENCOUNTER — Encounter: Payer: Self-pay | Admitting: Physician Assistant

## 2022-03-05 ENCOUNTER — Ambulatory Visit (INDEPENDENT_AMBULATORY_CARE_PROVIDER_SITE_OTHER): Payer: 59 | Admitting: Physician Assistant

## 2022-03-05 DIAGNOSIS — M1611 Unilateral primary osteoarthritis, right hip: Secondary | ICD-10-CM

## 2022-03-05 NOTE — Progress Notes (Signed)
HPI: Jordan Vang returns today to go over the MRI of her right hip.  Still having significant pain in the right hip.  She does state that both knees are doing better since undergoing cortisone injections.  She has known osteoarthritis of both knees. MRIs reviewed with the patient.  MRI of her right hip shows subchondral edema changes noninflamed right femoral head and acetabulum.  Extensive full-thickness cartilage loss involving the right hip consistent with severe osteoarthritis.  No acute fractures.  No evidence of AVN.  Impression: End-stage arthritis right hip   Plan: Given patient's significant pain she is having in the right hip and findings of end-stage arthritis which is severe on MRI recommend right total hip arthroplasty.  Risk benefits are understood by patient as she has had prior left total hip arthroplasty.  She does have a leg length discrepancy with the left leg length being longer than the right.  She is hoping surgery will correct this.  Also she had problems with narcotics postop from late left total hip arthroplasty and found that only Dilaudid was helpful.  We will see her back 2 weeks postop.  Questions were encouraged and answered at length.  She did note that post cortisone injection both knees she had some shortness of breath but this was short-lived.  She denies any chest pain or shortness of breath currently.

## 2022-03-12 ENCOUNTER — Encounter: Payer: Self-pay | Admitting: Orthopaedic Surgery

## 2022-03-19 ENCOUNTER — Encounter: Payer: Self-pay | Admitting: Orthopaedic Surgery

## 2022-03-19 ENCOUNTER — Encounter: Payer: Self-pay | Admitting: Family Medicine

## 2022-03-20 ENCOUNTER — Telehealth: Payer: Self-pay

## 2022-03-20 NOTE — Telephone Encounter (Signed)
Bilateral knee injections possibly ASAP? She is going out of town 04/11/22 and wants them before that if possible??

## 2022-03-20 NOTE — Telephone Encounter (Signed)
VOB submitted for Durolane, bilateral knee.  

## 2022-03-22 NOTE — Telephone Encounter (Signed)
Appts.have been scheduled for Euflexxa, bilateral knee.  VOB submitted for Euflexxa, bilateral knee per patient request

## 2022-03-23 ENCOUNTER — Other Ambulatory Visit: Payer: Self-pay

## 2022-03-23 DIAGNOSIS — M1712 Unilateral primary osteoarthritis, left knee: Secondary | ICD-10-CM

## 2022-03-23 DIAGNOSIS — M1711 Unilateral primary osteoarthritis, right knee: Secondary | ICD-10-CM

## 2022-03-26 ENCOUNTER — Ambulatory Visit (INDEPENDENT_AMBULATORY_CARE_PROVIDER_SITE_OTHER): Payer: 59 | Admitting: Orthopaedic Surgery

## 2022-03-26 ENCOUNTER — Encounter: Payer: Self-pay | Admitting: Orthopaedic Surgery

## 2022-03-26 DIAGNOSIS — M1711 Unilateral primary osteoarthritis, right knee: Secondary | ICD-10-CM | POA: Diagnosis not present

## 2022-03-26 DIAGNOSIS — M170 Bilateral primary osteoarthritis of knee: Secondary | ICD-10-CM | POA: Diagnosis not present

## 2022-03-26 DIAGNOSIS — M1611 Unilateral primary osteoarthritis, right hip: Secondary | ICD-10-CM

## 2022-03-26 DIAGNOSIS — M1712 Unilateral primary osteoarthritis, left knee: Secondary | ICD-10-CM | POA: Diagnosis not present

## 2022-03-26 MED ORDER — SODIUM HYALURONATE (VISCOSUP) 20 MG/2ML IX SOSY
20.0000 mg | PREFILLED_SYRINGE | INTRA_ARTICULAR | Status: AC | PRN
  Administered 2022-03-26: 20 mg via INTRA_ARTICULAR

## 2022-03-26 NOTE — Progress Notes (Signed)
   Procedure Note  Patient: Jordan Vang             Date of Birth: [birth year]           MRN: [id number]             Visit Date: 03/26/2022  Procedures: Visit Diagnoses:  1. Primary osteoarthritis of right knee   2. Primary osteoarthritis of left knee   3. Primary osteoarthritis of right hip     Large Joint Inj: R knee on 03/26/2022 1:40 PM Indications: diagnostic evaluation and pain Details: 22 G 1.5 in needle, superolateral approach  Arthrogram: No  Medications: 20 mg Sodium Hyaluronate (Viscosup) 20 MG/2ML Outcome: tolerated well, no immediate complications Procedure, treatment alternatives, risks and benefits explained, specific risks discussed. Consent was given by the patient. Immediately prior to procedure a time out was called to verify the correct patient, procedure, equipment, support staff and site/side marked as required. Patient was prepped and draped in the usual sterile fashion.    Large Joint Inj: L knee on 03/26/2022 1:41 PM Indications: diagnostic evaluation and pain Details: 22 G 1.5 in needle, superolateral approach  Arthrogram: No  Medications: 20 mg Sodium Hyaluronate (Viscosup) 20 MG/2ML Outcome: tolerated well, no immediate complications Procedure, treatment alternatives, risks and benefits explained, specific risks discussed. Consent was given by the patient. Immediately prior to procedure a time out was called to verify the correct patient, procedure, equipment, support staff and site/side marked as required. Patient was prepped and draped in the usual sterile fashion.    Jordan Vang is here today for injection number one of the series of 3 hyaluronic acid injections with Euflexxa in both knees.  We actually have her scheduled for a hip replacement for her right side in January.  Her biggest goal is getting her leg lengths on is much as possible and we had a long and thorough discussion about how we would do that.  Neither knee has an effusion.  I did place  injection number one of the series of 3 Euflexxa injections with both knees today without difficulty.  Will see her back next week for injection #2.

## 2022-04-02 ENCOUNTER — Encounter: Payer: Self-pay | Admitting: Physician Assistant

## 2022-04-02 ENCOUNTER — Ambulatory Visit (INDEPENDENT_AMBULATORY_CARE_PROVIDER_SITE_OTHER): Payer: 59 | Admitting: Physician Assistant

## 2022-04-02 ENCOUNTER — Encounter: Payer: Self-pay | Admitting: Orthopaedic Surgery

## 2022-04-02 DIAGNOSIS — M170 Bilateral primary osteoarthritis of knee: Secondary | ICD-10-CM

## 2022-04-02 DIAGNOSIS — M1711 Unilateral primary osteoarthritis, right knee: Secondary | ICD-10-CM

## 2022-04-02 DIAGNOSIS — M1712 Unilateral primary osteoarthritis, left knee: Secondary | ICD-10-CM | POA: Diagnosis not present

## 2022-04-02 DIAGNOSIS — M2170 Unequal limb length (acquired), unspecified site: Secondary | ICD-10-CM

## 2022-04-02 MED ORDER — SODIUM HYALURONATE (VISCOSUP) 20 MG/2ML IX SOSY
20.0000 mg | PREFILLED_SYRINGE | INTRA_ARTICULAR | Status: AC | PRN
  Administered 2022-04-02: 20 mg via INTRA_ARTICULAR

## 2022-04-02 NOTE — Progress Notes (Signed)
   Procedure Note  Patient: Jordan Vang             Date of Birth: 04/05/1966           MRN: [id number]             Visit Date: 04/02/2022 HPI Ms. Tedesco comes in today for second Euflexxa injections in both knees.  She states knees are better.  She had no adverse effects to the injections.  She has questions again about her leg length discrepancy.  She has known right hip osteoarthritis and is concerned about her leg lengths being equal after having the right hip replaced that she has already had left total hip arthroplasty performed.  She wears a lift in her right shoe.  Physical exam: General well-developed well-nourished female no acute distress bilateral knees no abnormal warmth or erythema.  Procedures: Visit Diagnoses:  1. Primary osteoarthritis of right knee   2. Primary osteoarthritis of left knee   3. Leg length difference, acquired     Large Joint Inj: bilateral knee on 04/02/2022 5:15 PM Indications: pain Details: 22 G 1.5 in needle, anterolateral approach  Arthrogram: No  Medications (Right): 20 mg Sodium Hyaluronate (Viscosup) 20 MG/2ML Medications (Left): 20 mg Sodium Hyaluronate (Viscosup) 20 MG/2ML Outcome: tolerated well, no immediate complications Procedure, treatment alternatives, risks and benefits explained, specific risks discussed. Consent was given by the patient. Immediately prior to procedure a time out was called to verify the correct patient, procedure, equipment, support staff and site/side marked as required. Patient was prepped and draped in the usual sterile fashion.     Plan: Please see back in 1 week for third Euflexxa injections both knees.  Will scanogram to evaluate her leg lengths..  Questions were encouraged and answered at length today.

## 2022-04-03 NOTE — Addendum Note (Signed)
Addended by: Robyne Peers on: 04/03/2022 08:44 AM   Modules accepted: Orders

## 2022-04-05 ENCOUNTER — Encounter: Payer: Self-pay | Admitting: Orthopaedic Surgery

## 2022-04-05 NOTE — Telephone Encounter (Signed)
Sent email to [name] with [facility] scheduling-they will contact pt to schedule appt

## 2022-04-06 ENCOUNTER — Ambulatory Visit (HOSPITAL_COMMUNITY)
Admission: RE | Admit: 2022-04-06 | Discharge: 2022-04-06 | Disposition: A | Discharge status: Home / Self Care | Payer: 59 | Source: Ambulatory Visit | Attending: Physician Assistant | Admitting: Physician Assistant

## 2022-04-06 DIAGNOSIS — M2170 Unequal limb length (acquired), unspecified site: Secondary | ICD-10-CM | POA: Insufficient documentation

## 2022-04-09 ENCOUNTER — Ambulatory Visit (INDEPENDENT_AMBULATORY_CARE_PROVIDER_SITE_OTHER): Payer: 59 | Admitting: Physician Assistant

## 2022-04-09 ENCOUNTER — Encounter: Payer: Self-pay | Admitting: Physician Assistant

## 2022-04-09 DIAGNOSIS — M1711 Unilateral primary osteoarthritis, right knee: Secondary | ICD-10-CM | POA: Diagnosis not present

## 2022-04-09 DIAGNOSIS — M1712 Unilateral primary osteoarthritis, left knee: Secondary | ICD-10-CM | POA: Diagnosis not present

## 2022-04-09 DIAGNOSIS — M170 Bilateral primary osteoarthritis of knee: Secondary | ICD-10-CM | POA: Diagnosis not present

## 2022-04-09 NOTE — Progress Notes (Unsigned)
   Procedure Note  Patient: Jordan Vang             Date of Birth: 07/24/1965           MRN: [id number]             Visit Date: 04/09/2022  Procedures: Visit Diagnoses:  1. Primary osteoarthritis of right knee   2. Primary osteoarthritis of left knee     Large Joint Inj: bilateral knee on 04/09/2022 9:22 PM Indications: pain Details: 22 G 1.5 in needle, anterolateral approach  Arthrogram: No  Medications (Right): 20 mg Sodium Hyaluronate (Viscosup) 20 MG/2ML Medications (Left): 20 mg Sodium Hyaluronate (Viscosup) 20 MG/2ML Outcome: tolerated well, no immediate complications Procedure, treatment alternatives, risks and benefits explained, specific risks discussed. Consent was given by the patient. Immediately prior to procedure a time out was called to verify the correct patient, procedure, equipment, support staff and site/side marked as required. Patient was prepped and draped in the usual sterile fashion.

## 2022-04-10 MED ORDER — SODIUM HYALURONATE (VISCOSUP) 20 MG/2ML IX SOSY
20.0000 mg | PREFILLED_SYRINGE | INTRA_ARTICULAR | Status: AC | PRN
  Administered 2022-04-09: 20 mg via INTRA_ARTICULAR

## 2022-04-12 ENCOUNTER — Other Ambulatory Visit: Payer: Self-pay

## 2022-04-24 ENCOUNTER — Encounter: Payer: Self-pay | Admitting: Orthopaedic Surgery

## 2022-04-30 ENCOUNTER — Other Ambulatory Visit: Payer: Self-pay | Admitting: Physician Assistant

## 2022-04-30 DIAGNOSIS — Z01818 Encounter for other preprocedural examination: Secondary | ICD-10-CM

## 2022-05-04 ENCOUNTER — Inpatient Hospital Stay (HOSPITAL_COMMUNITY): Admission: RE | Admit: 2022-05-04 | Payer: 59 | Source: Ambulatory Visit

## 2022-05-06 NOTE — Patient Instructions (Signed)
SURGICAL WAITING ROOM VISITATION Patients having surgery or a procedure may have no more than 2 support people in the waiting area - these visitors may rotate in the visitor waiting room.   Due to an increase in RSV and influenza rates and associated hospitalizations, children ages 50 and under may not visit patients in [place]. If the patient needs to stay at the hospital during part of their recovery, the visitor guidelines for inpatient rooms apply.  PRE-OP VISITATION  Pre-op nurse will coordinate an appropriate time for 1 support person to accompany the patient in pre-op.  This support person may not rotate.  This visitor will be contacted when the time is appropriate for the visitor to come back in the pre-op area.  Please refer to the [facility] website for the visitor guidelines for Inpatients (after your surgery is over and you are in a regular room).  You are not required to quarantine at this time prior to your surgery. However, you must do this: Hand Hygiene often Do NOT share personal items Notify your provider if you are in close contact with someone who has COVID or you develop fever 100.4 or greater, new onset of sneezing, cough, sore throat, shortness of breath or body aches.  If you test positive for Covid or have been in contact with anyone that has tested positive in the last 10 days please notify you surgeon.    Your procedure is scheduled on: Friday May 11, 2022   Report to [facility] Main Entrance: [place] entrance where the [employer] is available.   Report to admitting at:  05:15   AM  +++++Call this number if you have any questions or problems the morning of surgery [phone]  Do not eat food after Midnight the night prior to your surgery/procedure.  After Midnight you may have the following liquids until   04:15 AM DAY OF SURGERY  Clear Liquid Diet Water Black Coffee (sugar ok, NO MILK/CREAM OR CREAMERS)  Tea (sugar ok, NO  MILK/CREAM OR CREAMERS) regular and decaf                             Plain Jell-O  with no fruit (NO RED)                                           Fruit ices (not with fruit pulp, NO RED)                                     Popsicles (NO RED)                                                                  Juice: apple, WHITE grape, WHITE cranberry Sports drinks like Gatorade or Powerade (NO RED)                    The day of surgery:  Drink ONE (1) Pre-Surgery Clear Ensure at  05:15 AM the morning of surgery. Drink in one sitting.  Do not sip.  This drink was given to you during your hospital pre-op appointment visit. Nothing else to drink after completing the Pre-Surgery Clear Ensure : No candy, chewing gum or throat lozenges.    FOLLOW  ANY ADDITIONAL PRE OP INSTRUCTIONS YOU RECEIVED FROM YOUR SURGEON'S OFFICE!!!   Oral Hygiene is also important to reduce your risk of infection.        Remember - BRUSH YOUR TEETH THE MORNING OF SURGERY WITH YOUR REGULAR TOOTHPASTE  Take ONLY these medicines the morning of surgery with A SIP OF WATER: levothyroxine (Synthroid), fexofenadine (Allegra) and you may use your Flonase Nasal Spray and your Eye drops if needed.  If You have been diagnosed with Sleep Apnea - Bring CPAP mask and tubing day of surgery. We will provide you with a CPAP machine on the day of your surgery.                   You may not have any metal on your body including hair pins, jewelry, and body piercing  Do not wear make-up, lotions, powders, perfumes or deodorant  Do not wear nail polish including gel and S&S, artificial / acrylic nails, or any other type of covering on natural nails including finger and toenails. If you have artificial nails, gel coating, etc., that needs to be removed by a nail salon, Please have this removed prior to surgery. Not doing so may mean that your surgery could be cancelled or delayed if the Surgeon or anesthesia staff feels like they are  unable to monitor you safely.   Do not shave 48 hours prior to surgery to avoid nicks in your skin which may contribute to postoperative infections.   Contacts, Hearing Aids, dentures or bridgework may not be worn into surgery. DENTURES WILL BE REMOVED PRIOR TO SURGERY PLEASE DO NOT APPLY "Poly grip" OR ADHESIVES!!!  You may bring a small overnight bag with you on the day of surgery, only pack items that are not valuable. [facility] IS NOT RESPONSIBLE   FOR VALUABLES THAT ARE LOST OR STOLEN.   Do not bring your home medications to the hospital. The Pharmacy will dispense medications listed on your medication list to you during your admission in the Hospital.  Special Instructions: Bring a copy of your healthcare power of attorney and living will documents the day of surgery, if you wish to have them scanned into your [facility] Medical Records- EPIC  Please read over the following fact sheets you were given: IF YOU HAVE QUESTIONS ABOUT YOUR PRE-OP INSTRUCTIONS, PLEASE CALL [phone]  ([place])   [place] - Preparing for Surgery Before surgery, you can play an important role.  Because skin is not sterile, your skin needs to be as free of germs as possible.  You can reduce the number of germs on your skin by washing with CHG (chlorahexidine gluconate) soap before surgery.  CHG is an antiseptic cleaner which kills germs and bonds with the skin to continue killing germs even after washing. Please DO NOT use if you have an allergy to CHG or antibacterial soaps.  If your skin becomes reddened/irritated stop using the CHG and inform your nurse when you arrive at Short Stay. Do not shave (including legs and underarms) for at least 48 hours prior to the first CHG shower.  You may shave your face/neck.  Please follow these instructions carefully:  1.  Shower with CHG Soap the night before surgery and the  morning of surgery.  2.  If you choose to wash your hair, wash your hair first as usual with your  normal  shampoo.  3.  After you shampoo, rinse your hair and body thoroughly to remove the shampoo.                             4.  Use CHG as you would any other liquid soap.  You can apply chg directly to the skin and wash.  Gently with a scrungie or clean washcloth.  5.  Apply the CHG Soap to your body ONLY FROM THE NECK DOWN.   Do not use on face/ open                           Wound or open sores. Avoid contact with eyes, ears mouth and genitals (private parts).                       Wash face,  Genitals (private parts) with your normal soap.             6.  Wash thoroughly, paying special attention to the area where your  surgery  will be performed.  7.  Thoroughly rinse your body with warm water from the neck down.  8.  DO NOT shower/wash with your normal soap after using and rinsing off the CHG Soap.            9.  Pat yourself dry with a clean towel.            10.  Wear clean pajamas.            11.  Place clean sheets on your bed the night of your first shower and do not  sleep with pets.  ON THE DAY OF SURGERY : Do not apply any lotions/deodorants the morning of surgery.  Please wear clean clothes to the hospital/surgery center.    FAILURE TO FOLLOW THESE INSTRUCTIONS MAY RESULT IN THE CANCELLATION OF YOUR SURGERY  PATIENT SIGNATURE_________________________________  NURSE SIGNATURE__________________________________  ________________________________________________________________________       Adam Phenix    An incentive spirometer is a tool that can help keep your lungs clear and active. This tool measures how well you are filling your lungs with each breath. Taking long deep breaths may help reverse or decrease the chance of developing breathing (pulmonary) problems (especially infection) following: A long period of time when you are unable to move or be active. BEFORE THE PROCEDURE  If the spirometer includes an indicator to show your best effort, your  nurse or respiratory therapist will set it to a desired goal. If possible, sit up straight or lean slightly forward. Try not to slouch. Hold the incentive spirometer in an upright position. INSTRUCTIONS FOR USE  Sit on the edge of your bed if possible, or sit up as far as you can in bed or on a chair. Hold the incentive spirometer in an upright position. Breathe out normally. Place the mouthpiece in your mouth and seal your lips tightly around it. Breathe in slowly and as deeply as possible, raising the piston or the ball toward the top of the column. Hold your breath for 3-5 seconds or for as long as possible. Allow the piston or ball to fall to the bottom of the column. Remove the mouthpiece from your mouth and breathe out normally. Rest for a few  seconds and repeat Steps 1 through 7 at least 10 times every 1-2 hours when you are awake. Take your time and take a few normal breaths between deep breaths. The spirometer may include an indicator to show your best effort. Use the indicator as a goal to work toward during each repetition. After each set of 10 deep breaths, practice coughing to be sure your lungs are clear. If you have an incision (the cut made at the time of surgery), support your incision when coughing by placing a pillow or rolled up towels firmly against it. Once you are able to get out of bed, walk around indoors and cough well. You may stop using the incentive spirometer when instructed by your caregiver.  RISKS AND COMPLICATIONS Take your time so you do not get dizzy or light-headed. If you are in pain, you may need to take or ask for pain medication before doing incentive spirometry. It is harder to take a deep breath if you are having pain. AFTER USE Rest and breathe slowly and easily. It can be helpful to keep track of a log of your progress. Your caregiver can provide you with a simple table to help with this. If you are using the spirometer at home, follow these  instructions: [place] IF:  You are having difficultly using the spirometer. You have trouble using the spirometer as often as instructed. Your pain medication is not giving enough relief while using the spirometer. You develop fever of 100.5 F (38.1 C) or higher.                                                                                                    SEEK IMMEDIATE MEDICAL CARE IF:  You cough up bloody sputum that had not been present before. You develop fever of 102 F (38.9 C) or greater. You develop worsening pain at or near the incision site. MAKE SURE YOU:  Understand these instructions. Will watch your condition. Will get help right away if you are not doing well or get worse. Document Released: 08/20/2006 Document Revised: 07/02/2011 Document Reviewed: 10/21/2006 [facility] Patient Information 2014 [place], [place].      WHAT IS A BLOOD TRANSFUSION? Blood Transfusion Information  A transfusion is the replacement of blood or some of its parts. Blood is made up of multiple cells which provide different functions. Red blood cells carry oxygen and are used for blood loss replacement. White blood cells fight against infection. Platelets control bleeding. Plasma helps clot blood. Other blood products are available for specialized needs, such as hemophilia or other clotting disorders. BEFORE THE TRANSFUSION  Who gives blood for transfusions?  Healthy volunteers who are fully evaluated to make sure their blood is safe. This is blood bank blood. Transfusion therapy is the safest it has ever been in the practice of medicine. Before blood is taken from a donor, a complete history is taken to make sure that person has no history of diseases nor engages in risky social behavior (examples are intravenous drug use or sexual activity with multiple partners). The donor's travel history  is screened to minimize risk of transmitting infections, such as malaria. The donated  blood is tested for signs of infectious diseases, such as HIV and hepatitis. The blood is then tested to be sure it is compatible with you in order to minimize the chance of a transfusion reaction. If you or a relative donates blood, this is often done in anticipation of surgery and is not appropriate for emergency situations. It takes many days to process the donated blood. RISKS AND COMPLICATIONS Although transfusion therapy is very safe and saves many lives, the main dangers of transfusion include:  Getting an infectious disease. Developing a transfusion reaction. This is an allergic reaction to something in the blood you were given. Every precaution is taken to prevent this. The decision to have a blood transfusion has been considered carefully by your caregiver before blood is given. Blood is not given unless the benefits outweigh the risks. AFTER THE TRANSFUSION Right after receiving a blood transfusion, you will usually feel much better and more energetic. This is especially true if your red blood cells have gotten low (anemic). The transfusion raises the level of the red blood cells which carry oxygen, and this usually causes an energy increase. The nurse administering the transfusion will monitor you carefully for complications. HOME CARE INSTRUCTIONS  No special instructions are needed after a transfusion. You may find your energy is better. Speak with your caregiver about any limitations on activity for underlying diseases you may have. SEEK MEDICAL CARE IF:  Your condition is not improving after your transfusion. You develop redness or irritation at the intravenous (IV) site. SEEK IMMEDIATE MEDICAL CARE IF:  Any of the following symptoms occur over the next 12 hours: Shaking chills. You have a temperature by mouth above 102 F (38.9 C), not controlled by medicine. Chest, back, or muscle pain. People around you feel you are not acting correctly or are confused. Shortness of breath or  difficulty breathing. Dizziness and fainting. You get a rash or develop hives. You have a decrease in urine output. Your urine turns a dark color or changes to pink, red, or brown. Any of the following symptoms occur over the next 10 days: You have a temperature by mouth above 102 F (38.9 C), not controlled by medicine. Shortness of breath. Weakness after normal activity. The white part of the eye turns yellow (jaundice). You have a decrease in the amount of urine or are urinating less often. Your urine turns a dark color or changes to pink, red, or brown. Document Released: 04/06/2000 Document Revised: 07/02/2011 Document Reviewed: 11/24/2007 [facility] Patient Information 2014 [place], [place].  _______________________________________________________________________

## 2022-05-06 NOTE — Progress Notes (Signed)
COVID Vaccine received:  _0  No _1  Yes Date of any COVID positive Test in last 90 days:  PCP - Annye Asa, MD Cardiologist -   Chest x-ray - 01-29-2020  Epic EKG - 02-01-2020  Epic   no pertinent history to warrant repeat EKG.  Stress Test -  ECHO -  Cardiac Cath -   PCR screen: _2  Ordered & Completed                      _3   No Order but Needs PROFEND                      _4   N/A for this surgery  Surgery Plan:  _5  Ambulatory                            _6  Outpatient in bed                            _7  Admit  Anesthesia:    _8  General  _9  Spinal                           _10   Choice _11   MAC  Pacemaker / ICD device _12  No _13  Yes        Device order form faxed _14  No    _15   Yes      Faxed to:  Spinal Cord Stimulator:_16  No _17  Yes      (Remind patient to bring remote DOS) Other Implants:   History of Sleep Apnea? _18  No _19  Yes   CPAP used?- _20  No _21  Yes    Does the patient monitor blood sugar? _22  No _23  Yes  _24  N/A  Blood Thinner / Instructions:None Aspirin Instructions:  None  ERAS Protocol Ordered: _25  No  _26  Yes PRE-SURGERY _27  ENSURE  _28  G2  Patient is to be NPO after: 04:15 am  Comments:   Activity level: Patient can / can not climb a flight of stairs without difficulty; _29  No CP  _30  No SOB, but would have ______   Patient can / can not perform ADLs without assistance.   Anesthesia review: OSA (CPAP), no other pertinent hx.  Patient denies shortness of breath, fever, cough and chest pain at PAT appointment.  Patient verbalized understanding and agreement to the Pre-Surgical Instructions that were given to them at this PAT appointment. Patient was also educated of the need to review these PAT instructions again prior to his/her surgery.I reviewed the appropriate phone numbers to call if they have any and questions or concerns.

## 2022-05-08 ENCOUNTER — Other Ambulatory Visit: Payer: Self-pay

## 2022-05-08 ENCOUNTER — Encounter (HOSPITAL_COMMUNITY)
Admission: RE | Admit: 2022-05-08 | Discharge: 2022-05-08 | Disposition: A | Discharge status: Home / Self Care | Payer: 59 | Source: Ambulatory Visit | Attending: Orthopaedic Surgery | Admitting: Orthopaedic Surgery

## 2022-05-08 ENCOUNTER — Encounter (HOSPITAL_COMMUNITY): Payer: Self-pay

## 2022-05-08 VITALS — BP 125/82 | HR 69 | Temp 98.9°F | Resp 16 | Ht 62.5 in | Wt 201.0 lb

## 2022-05-08 DIAGNOSIS — Z01818 Encounter for other preprocedural examination: Secondary | ICD-10-CM

## 2022-05-08 DIAGNOSIS — Z01812 Encounter for preprocedural laboratory examination: Secondary | ICD-10-CM | POA: Diagnosis not present

## 2022-05-08 HISTORY — DX: Malignant (primary) neoplasm, unspecified: C80.1

## 2022-05-08 LAB — COMPREHENSIVE METABOLIC PANEL
ALT: 24 U/L (ref 0–44)
AST: 19 U/L (ref 15–41)
Albumin: 4.1 g/dL (ref 3.5–5.0)
Alkaline Phosphatase: 55 U/L (ref 38–126)
Anion gap: 7 (ref 5–15)
BUN: 16 mg/dL (ref 6–20)
CO2: 26 mmol/L (ref 22–32)
Calcium: 9.5 mg/dL (ref 8.9–10.3)
Chloride: 106 mmol/L (ref 98–111)
Creatinine, Ser: 0.69 mg/dL (ref 0.44–1.00)
GFR, Estimated: >60 mL/min (ref >60)
Glucose, Bld: 80 mg/dL (ref 70–99)
Potassium: 4.1 mmol/L (ref 3.5–5.1)
Sodium: 139 mmol/L (ref 135–145)
Total Bilirubin: 0.5 mg/dL (ref 0.3–1.2)
Total Protein: 6.7 g/dL (ref 6.5–8.1)

## 2022-05-08 LAB — SURGICAL PCR SCREEN
MRSA, PCR: NEGATIVE
Staphylococcus aureus: NEGATIVE

## 2022-05-08 LAB — CBC
HCT: 40.9 % (ref 36.0–46.0)
Hemoglobin: 12.9 g/dL (ref 12.0–15.0)
MCH: 27.1 pg (ref 26.0–34.0)
MCHC: 31.5 g/dL (ref 30.0–36.0)
MCV: 85.9 fL (ref 80.0–100.0)
Platelets: 243 10*3/uL (ref 150–400)
RBC: 4.76 MIL/uL (ref 3.87–5.11)
RDW: 13.4 % (ref 11.5–15.5)
WBC: 7.1 10*3/uL (ref 4.0–10.5)
nRBC: 0 % (ref 0.0–0.2)

## 2022-05-10 DIAGNOSIS — M1611 Unilateral primary osteoarthritis, right hip: Secondary | ICD-10-CM | POA: Insufficient documentation

## 2022-05-10 NOTE — H&P (Signed)
TOTAL HIP ADMISSION H&P  Patient is admitted for right total hip arthroplasty.  Subjective:  Chief Complaint: right hip pain  HPI: Jordan Vang, 57 y.o. female, has a history of pain and functional disability in the right hip(s) due to arthritis and patient has failed non-surgical conservative treatments for greater than 12 weeks to include NSAID's and/or analgesics, flexibility and strengthening excercises, weight reduction as appropriate, and activity modification.  Onset of symptoms was gradual starting 1 years ago with gradually worsening course since that time.The patient noted no past surgery on the right hip(s).  Patient currently rates pain in the right hip at 10 out of 10 with activity. Patient has night pain, worsening of pain with activity and weight bearing, pain that interfers with activities of daily living, and pain with passive range of motion. Patient has evidence of subchondral sclerosis, periarticular osteophytes, and joint space narrowing by imaging studies. This condition presents safety issues increasing the risk of falls.  There is no current active infection.  Patient Active Problem List   Diagnosis Date Noted   Unilateral primary osteoarthritis, right hip 05/10/2022   Chronic pain of both knees 12/05/2021   Right hip pain 12/05/2021   Acquired leg length discrepancy 12/05/2021   Physical exam 11/28/2021   Acquired hypothyroidism 05/30/2021   Adverse effect of antihyperlipidemic and antiarteriosclerotic drugs, initial encounter 05/30/2021   Allergic rhinitis 05/30/2021   Chronic pain syndrome 05/30/2021   Dyslipidemia 05/30/2021   Myalgia 05/30/2021   Primary osteoarthritis 05/30/2021   Recurrent major depression in remission ([place]) 05/30/2021   Solitary pulmonary nodule 05/30/2021   Sleep pattern disturbance 05/30/2021   OSA (obstructive sleep apnea) 05/30/2021   Status post left hip replacement 08/02/2020   B12 deficiency 11/22/2015   Fatigue 08/16/2015    Basal cell carcinoma, lip 10/28/2013   Morbid obesity ([place]) 10/28/2013   Past Medical History:  Diagnosis Date   Allergy    Anemia    Arthritis    Cancer ([place])    basal cell cancers, MOH's on lip, several on back   Elevated cholesterol    Hypothyroidism    PMDD (premenstrual dysphoric disorder)    Dr Phineas Real   Sleep apnea    CPAP   Thyroid disease     Past Surgical History:  Procedure Laterality Date   BASAL CELL CARCINOMA EXCISION     Mohs surgery on Lip   KNEE SURGERY Right 04/23/2013   arthroscopy   TONSILLECTOMY     TOTAL HIP ARTHROPLASTY Left 08/02/2020   Procedure: LEFT TOTAL HIP ARTHROPLASTY ANTERIOR APPROACH;  Surgeon: Mcarthur Rossetti, MD;  Location: [facility];  Service: Orthopedics;  Laterality: Left;    No current facility-administered medications for this encounter.   Current Outpatient Medications  Medication Sig Dispense Refill Last Dose   Calcium Carb-Cholecalciferol (CALCIUM 600 + D PO) Take 1 tablet by mouth at bedtime.      carboxymethylcellulose (REFRESH PLUS) 0.5 % SOLN Place 1 drop into both eyes 3 (three) times daily as needed (dry eyes).      Cholecalciferol (VITAMIN D) 50 MCG (2000 UT) tablet Take 2,000 Units by mouth daily.      COLLAGEN PO Take 1 capsule by mouth daily.      fexofenadine (ALLEGRA) 180 MG tablet Take 180 mg by mouth daily.      fluticasone (FLONASE) 50 MCG/ACT nasal spray USE 1 SPRAY IN EACH NOSTRIL ONCE DAILY. 16 g 3    ketotifen (ZADITOR) 0.025 % ophthalmic solution Place 1  drop into both eyes 2 (two) times daily as needed (allergies).      levothyroxine (SYNTHROID) 50 MCG tablet Take 1 tablet (50 mcg total) by mouth daily. Must keep 01/03/17 appt for future refills 60 tablet 1    Liniments (DEEP BLUE RELIEF EX) Apply 1 Application topically daily as needed (pain).      Magnesium Bisglycinate (MAG GLYCINATE PO) Take 120 mg by mouth daily.      melatonin 3 MG TABS tablet Take 3 mg by mouth at bedtime.      meloxicam (MOBIC) 15  MG tablet Take 1 tablet (15 mg total) by mouth daily. 90 tablet 1    Multiple Vitamin (MULTIVITAMIN WITH MINERALS) TABS tablet Take 1 tablet by mouth daily.      Omega-3 Fatty Acids (FISH OIL) 1200 MG CAPS Take 1,200 mg by mouth daily.      rosuvastatin (CRESTOR) 20 MG tablet Take 1 tablet (20 mg total) by mouth daily. (Patient taking differently: Take 20 mg by mouth at bedtime.) 90 tablet 3    sertraline (ZOLOFT) 50 MG tablet Take 1 tablet (50 mg total) by mouth daily. (Patient taking differently: Take 50 mg by mouth at bedtime.) 90 tablet 3    triamcinolone cream (KENALOG) 0.1 % Apply 1 application topically 2 (two) times daily as needed (itching).      Allergies  Allergen Reactions   Azithromycin     Flu like symptoms    Naproxen Swelling    edema in legs/ankles   Atorvastatin     Muscle breakdown    Celebrex [Celecoxib]     Hypertension    Tape     Redness     Social History   Tobacco Use   Smoking status: Never   Smokeless tobacco: Never  Substance Use Topics   Alcohol use: Yes    Alcohol/week: 0.0 standard drinks of alcohol    Comment: socially    Family History  Problem Relation Age of Onset   Hearing loss Mother    Depression Mother    Cancer Mother    Breast cancer Mother        Age 7   Lung cancer Mother        smoker   Osteoporosis Mother    Arthritis Father    Hyperlipidemia Father    Stroke Father 4   Hyperlipidemia Sister    Hyperlipidemia Brother    Heart attack Maternal Grandfather        < 63 ?   Diabetes Neg Hx    Colon cancer Neg Hx      Review of Systems  Musculoskeletal:  Positive for gait problem and joint swelling.  All other systems reviewed and are negative.   Objective:  Physical Exam Vitals reviewed.  Constitutional:      Appearance: Normal appearance.  HENT:     Head: Normocephalic and atraumatic.  Eyes:     Extraocular Movements: Extraocular movements intact.     Pupils: Pupils are equal, round, and reactive to light.   Cardiovascular:     Rate and Rhythm: Normal rate and regular rhythm.  Pulmonary:     Effort: Pulmonary effort is normal.     Breath sounds: Normal breath sounds.  Abdominal:     Palpations: Abdomen is soft.  Musculoskeletal:     Cervical back: Normal range of motion and neck supple.     Right hip: Tenderness and bony tenderness present. Decreased strength.  Neurological:     Mental Status:  She is alert and oriented to person, place, and time.  Psychiatric:        Behavior: Behavior normal.     Vital signs in last 24 hours:    Labs:   Estimated body mass index is 36.18 kg/m as calculated from the following:   Height as of 05/08/22: 5' 2.5" (1.588 m).   Weight as of 05/08/22: 91.2 kg.   Imaging Review Plain radiographs demonstrate severe degenerative joint disease of the right hip(s). The bone quality appears to be excellent for age and reported activity level.      Assessment/Plan:  End stage arthritis, right hip(s)  The patient history, physical examination, clinical judgement of the provider and imaging studies are consistent with end stage degenerative joint disease of the right hip(s) and total hip arthroplasty is deemed medically necessary. The treatment options including medical management, injection therapy, arthroscopy and arthroplasty were discussed at length. The risks and benefits of total hip arthroplasty were presented and reviewed. The risks due to aseptic loosening, infection, stiffness, dislocation/subluxation,  thromboembolic complications and other imponderables were discussed.  The patient acknowledged the explanation, agreed to proceed with the plan and consent was signed. Patient is being admitted for inpatient treatment for surgery, pain control, PT, OT, prophylactic antibiotics, VTE prophylaxis, progressive ambulation and ADL's and discharge planning.The patient is planning to be discharged home with home health services

## 2022-05-10 NOTE — Anesthesia Preprocedure Evaluation (Addendum)
Anesthesia Evaluation  Patient identified by MRN, date of birth, ID band Patient awake    Reviewed: Allergy & Precautions, NPO status , Patient's Chart, lab work & pertinent test results  Airway Mallampati: II  TM Distance: >3 FB Neck ROM: Full    Dental  (+) Dental Advisory Given, Teeth Intact   Pulmonary sleep apnea and Continuous Positive Airway Pressure Ventilation    Pulmonary exam normal breath sounds clear to auscultation       Cardiovascular negative cardio ROS Normal cardiovascular exam Rhythm:Regular Rate:Normal     Neuro/Psych  PSYCHIATRIC DISORDERS  Depression    negative neurological ROS     GI/Hepatic negative GI ROS, Neg liver ROS,,,  Endo/Other  Hypothyroidism  Morbid obesity  Renal/GU negative Renal ROS     Musculoskeletal  (+) Arthritis , Osteoarthritis,    Abdominal  (+) + obese  Peds  Hematology  (+) Blood dyscrasia, anemia   Anesthesia Other Findings   Reproductive/Obstetrics negative OB ROS                             Anesthesia Physical Anesthesia Plan  ASA: 3  Anesthesia Plan: Spinal   Post-op Pain Management: Tylenol PO (pre-op)*   Induction: Intravenous  PONV Risk Score and Plan: 2 and Ondansetron, Dexamethasone and Treatment may vary due to age or medical condition  Airway Management Planned: Simple Face Mask  Additional Equipment:   Intra-op Plan:   Post-operative Plan:   Informed Consent: I have reviewed the patients History and Physical, chart, labs and discussed the procedure including the risks, benefits and alternatives for the proposed anesthesia with the patient or authorized representative who has indicated his/her understanding and acceptance.     Dental advisory given  Plan Discussed with: CRNA  Anesthesia Plan Comments: (PAT note written 08/01/2020 by Myra Gianotti, PA-C. )        Anesthesia Quick Evaluation

## 2022-05-11 ENCOUNTER — Other Ambulatory Visit: Payer: Self-pay

## 2022-05-11 ENCOUNTER — Encounter (HOSPITAL_COMMUNITY)
Admission: RE | Disposition: A | Discharge status: Home Health | Payer: Self-pay | Source: Home / Self Care | Attending: Orthopaedic Surgery

## 2022-05-11 ENCOUNTER — Observation Stay (HOSPITAL_COMMUNITY): Payer: 59

## 2022-05-11 ENCOUNTER — Encounter (HOSPITAL_COMMUNITY): Payer: Self-pay | Admitting: Orthopaedic Surgery

## 2022-05-11 ENCOUNTER — Ambulatory Visit (HOSPITAL_COMMUNITY): Payer: 59 | Admitting: Anesthesiology

## 2022-05-11 ENCOUNTER — Ambulatory Visit (HOSPITAL_COMMUNITY): Payer: 59

## 2022-05-11 ENCOUNTER — Observation Stay (HOSPITAL_COMMUNITY)
Admission: RE | Admit: 2022-05-11 | Discharge: 2022-05-13 | Disposition: A | Discharge status: Home Health | Payer: 59 | Attending: Orthopaedic Surgery | Admitting: Orthopaedic Surgery

## 2022-05-11 ENCOUNTER — Ambulatory Visit (HOSPITAL_BASED_OUTPATIENT_CLINIC_OR_DEPARTMENT_OTHER): Payer: 59 | Admitting: Anesthesiology

## 2022-05-11 DIAGNOSIS — E039 Hypothyroidism, unspecified: Secondary | ICD-10-CM | POA: Insufficient documentation

## 2022-05-11 DIAGNOSIS — M1611 Unilateral primary osteoarthritis, right hip: Principal | ICD-10-CM | POA: Insufficient documentation

## 2022-05-11 DIAGNOSIS — D638 Anemia in other chronic diseases classified elsewhere: Secondary | ICD-10-CM

## 2022-05-11 DIAGNOSIS — G4733 Obstructive sleep apnea (adult) (pediatric): Secondary | ICD-10-CM

## 2022-05-11 DIAGNOSIS — Z79899 Other long term (current) drug therapy: Secondary | ICD-10-CM | POA: Diagnosis not present

## 2022-05-11 DIAGNOSIS — Z96641 Presence of right artificial hip joint: Secondary | ICD-10-CM

## 2022-05-11 DIAGNOSIS — Z9989 Dependence on other enabling machines and devices: Secondary | ICD-10-CM

## 2022-05-11 DIAGNOSIS — Z85828 Personal history of other malignant neoplasm of skin: Secondary | ICD-10-CM | POA: Insufficient documentation

## 2022-05-11 DIAGNOSIS — Z96642 Presence of left artificial hip joint: Secondary | ICD-10-CM | POA: Diagnosis not present

## 2022-05-11 HISTORY — PX: TOTAL HIP ARTHROPLASTY: SHX124

## 2022-05-11 LAB — TYPE AND SCREEN
ABO/RH(D): A NEG
Antibody Screen: NEGATIVE

## 2022-05-11 SURGERY — ARTHROPLASTY, HIP, TOTAL, ANTERIOR APPROACH
Anesthesia: Spinal | Site: Hip | Laterality: Right

## 2022-05-11 MED ORDER — MEPERIDINE HCL 50 MG/ML IJ SOLN: 6.2500 mg | INTRAMUSCULAR | Status: DC | PRN

## 2022-05-11 MED ORDER — MENTHOL 3 MG MT LOZG: 1.0000 | LOZENGE | OROMUCOSAL | Status: DC | PRN

## 2022-05-11 MED ORDER — ZOLPIDEM TARTRATE 5 MG PO TABS: 5.0000 mg | ORAL_TABLET | Freq: Every evening | ORAL | Status: DC | PRN

## 2022-05-11 MED ORDER — PROPOFOL 10 MG/ML IV BOLUS
INTRAVENOUS | Status: DC | PRN
  Administered 2022-05-11 (×2): 30 mg via INTRAVENOUS
  Administered 2022-05-11: 40 mg via INTRAVENOUS

## 2022-05-11 MED ORDER — ACETAMINOPHEN 325 MG PO TABS: 325.0000 mg | ORAL_TABLET | Freq: Four times a day (QID) | ORAL | Status: DC | PRN

## 2022-05-11 MED ORDER — DIPHENHYDRAMINE HCL 12.5 MG/5ML PO ELIX: 12.5000 mg | ORAL_SOLUTION | ORAL | Status: DC | PRN

## 2022-05-11 MED ORDER — CEFAZOLIN SODIUM-DEXTROSE 2-4 GM/100ML-% IV SOLN
2.0000 g | INTRAVENOUS | Status: AC
  Administered 2022-05-11: 2 g via INTRAVENOUS
  Filled 2022-05-11: qty 100

## 2022-05-11 MED ORDER — ROSUVASTATIN CALCIUM 20 MG PO TABS
20.0000 mg | ORAL_TABLET | Freq: Every day | ORAL | Status: DC
  Administered 2022-05-11 – 2022-05-12 (×2): 20 mg via ORAL
  Filled 2022-05-11 (×2): qty 1

## 2022-05-11 MED ORDER — ACETAMINOPHEN 500 MG PO TABS
1000.0000 mg | ORAL_TABLET | Freq: Once | ORAL | Status: AC
  Administered 2022-05-11: 1000 mg via ORAL
  Filled 2022-05-11: qty 2

## 2022-05-11 MED ORDER — POLYVINYL ALCOHOL 1.4 % OP SOLN: 1.0000 [drp] | Freq: Three times a day (TID) | OPHTHALMIC | Status: DC | PRN

## 2022-05-11 MED ORDER — 0.9 % SODIUM CHLORIDE (POUR BTL) OPTIME
TOPICAL | Status: DC | PRN
  Administered 2022-05-11: 1000 mL

## 2022-05-11 MED ORDER — CEFAZOLIN SODIUM-DEXTROSE 1-4 GM/50ML-% IV SOLN
1.0000 g | Freq: Four times a day (QID) | INTRAVENOUS | Status: AC
  Administered 2022-05-11 (×2): 1 g via INTRAVENOUS
  Filled 2022-05-11 (×2): qty 50

## 2022-05-11 MED ORDER — PHENOL 1.4 % MT LIQD: 1.0000 | OROMUCOSAL | Status: DC | PRN

## 2022-05-11 MED ORDER — PROPOFOL 500 MG/50ML IV EMUL
INTRAVENOUS | Status: DC | PRN
  Administered 2022-05-11: 90 ug/kg/min via INTRAVENOUS

## 2022-05-11 MED ORDER — FENTANYL CITRATE (PF) 100 MCG/2ML IJ SOLN
INTRAMUSCULAR | Status: DC | PRN
  Administered 2022-05-11 (×2): 50 ug via INTRAVENOUS

## 2022-05-11 MED ORDER — ASPIRIN 81 MG PO CHEW
81.0000 mg | CHEWABLE_TABLET | Freq: Two times a day (BID) | ORAL | Status: DC
  Administered 2022-05-11 – 2022-05-13 (×4): 81 mg via ORAL
  Filled 2022-05-11 (×4): qty 1

## 2022-05-11 MED ORDER — HYDROMORPHONE HCL 2 MG PO TABS
2.0000 mg | ORAL_TABLET | ORAL | Status: DC | PRN
  Administered 2022-05-11: 3 mg via ORAL
  Administered 2022-05-12: 2 mg via ORAL
  Administered 2022-05-12: 3 mg via ORAL
  Administered 2022-05-12: 2 mg via ORAL
  Filled 2022-05-11: qty 2
  Filled 2022-05-11 (×2): qty 1
  Filled 2022-05-11: qty 2

## 2022-05-11 MED ORDER — ORAL CARE MOUTH RINSE: 15.0000 mL | Freq: Once | OROMUCOSAL | Status: AC

## 2022-05-11 MED ORDER — MIDAZOLAM HCL 2 MG/2ML IJ SOLN
INTRAMUSCULAR | Status: DC | PRN
  Administered 2022-05-11: 2 mg via INTRAVENOUS

## 2022-05-11 MED ORDER — ONDANSETRON HCL 4 MG/2ML IJ SOLN: 4.0000 mg | Freq: Four times a day (QID) | INTRAMUSCULAR | Status: DC | PRN

## 2022-05-11 MED ORDER — POLYETHYLENE GLYCOL 3350 17 G PO PACK: 17.0000 g | PACK | Freq: Every day | ORAL | Status: DC | PRN

## 2022-05-11 MED ORDER — METHOCARBAMOL 500 MG PO TABS
500.0000 mg | ORAL_TABLET | Freq: Four times a day (QID) | ORAL | Status: DC | PRN
  Administered 2022-05-11 – 2022-05-12 (×4): 500 mg via ORAL
  Filled 2022-05-11 (×4): qty 1

## 2022-05-11 MED ORDER — MIDAZOLAM HCL 2 MG/2ML IJ SOLN
INTRAMUSCULAR | Status: AC
  Filled 2022-05-11: qty 2

## 2022-05-11 MED ORDER — DEXAMETHASONE SODIUM PHOSPHATE 10 MG/ML IJ SOLN
INTRAMUSCULAR | Status: AC
  Filled 2022-05-11: qty 1

## 2022-05-11 MED ORDER — LACTATED RINGERS IV SOLN: INTRAVENOUS | Status: DC

## 2022-05-11 MED ORDER — METOCLOPRAMIDE HCL 5 MG PO TABS: 5.0000 mg | ORAL_TABLET | Freq: Three times a day (TID) | ORAL | Status: DC | PRN

## 2022-05-11 MED ORDER — OXYCODONE HCL 5 MG PO TABS
5.0000 mg | ORAL_TABLET | ORAL | Status: DC | PRN
  Administered 2022-05-11: 10 mg via ORAL
  Filled 2022-05-11 (×2): qty 2

## 2022-05-11 MED ORDER — LEVOTHYROXINE SODIUM 50 MCG PO TABS
50.0000 ug | ORAL_TABLET | Freq: Every day | ORAL | Status: DC
  Administered 2022-05-12 – 2022-05-13 (×2): 50 ug via ORAL
  Filled 2022-05-11 (×2): qty 1

## 2022-05-11 MED ORDER — METHOCARBAMOL 500 MG IVPB - SIMPLE MED: 500.0000 mg | Freq: Four times a day (QID) | INTRAVENOUS | Status: DC | PRN

## 2022-05-11 MED ORDER — VITAMIN D 25 MCG (1000 UNIT) PO TABS
2000.0000 [IU] | ORAL_TABLET | Freq: Every day | ORAL | Status: DC
  Administered 2022-05-12 – 2022-05-13 (×2): 2000 [IU] via ORAL
  Filled 2022-05-11 (×2): qty 2

## 2022-05-11 MED ORDER — SODIUM CHLORIDE 0.9 % IR SOLN
Status: DC | PRN
  Administered 2022-05-11: 1000 mL

## 2022-05-11 MED ORDER — PROPOFOL 1000 MG/100ML IV EMUL
INTRAVENOUS | Status: AC
  Filled 2022-05-11: qty 100

## 2022-05-11 MED ORDER — GABAPENTIN 100 MG PO CAPS
100.0000 mg | ORAL_CAPSULE | Freq: Three times a day (TID) | ORAL | Status: DC
  Administered 2022-05-11 – 2022-05-13 (×6): 100 mg via ORAL
  Filled 2022-05-11 (×6): qty 1

## 2022-05-11 MED ORDER — ONDANSETRON HCL 4 MG PO TABS: 4.0000 mg | ORAL_TABLET | Freq: Four times a day (QID) | ORAL | Status: DC | PRN

## 2022-05-11 MED ORDER — SERTRALINE HCL 50 MG PO TABS
50.0000 mg | ORAL_TABLET | Freq: Every day | ORAL | Status: DC
  Administered 2022-05-13: 50 mg via ORAL
  Filled 2022-05-11 (×2): qty 1

## 2022-05-11 MED ORDER — METHOCARBAMOL 500 MG IVPB - SIMPLE MED
INTRAVENOUS | Status: AC
  Administered 2022-05-11: 500 mg via INTRAVENOUS
  Filled 2022-05-11: qty 55

## 2022-05-11 MED ORDER — HYDROMORPHONE HCL 1 MG/ML IJ SOLN
0.2500 mg | INTRAMUSCULAR | Status: DC | PRN
  Administered 2022-05-11 (×4): 0.25 mg via INTRAVENOUS
  Administered 2022-05-11: 0.5 mg via INTRAVENOUS

## 2022-05-11 MED ORDER — LIDOCAINE HCL (PF) 2 % IJ SOLN
INTRAMUSCULAR | Status: DC | PRN
  Administered 2022-05-11: 50 mg via INTRADERMAL

## 2022-05-11 MED ORDER — CHLORHEXIDINE GLUCONATE 0.12 % MT SOLN
15.0000 mL | Freq: Once | OROMUCOSAL | Status: AC
  Administered 2022-05-11: 15 mL via OROMUCOSAL

## 2022-05-11 MED ORDER — ONDANSETRON HCL 4 MG/2ML IJ SOLN
INTRAMUSCULAR | Status: AC
  Filled 2022-05-11: qty 2

## 2022-05-11 MED ORDER — FENTANYL CITRATE (PF) 100 MCG/2ML IJ SOLN
INTRAMUSCULAR | Status: AC
  Filled 2022-05-11: qty 2

## 2022-05-11 MED ORDER — FLUTICASONE PROPIONATE 50 MCG/ACT NA SUSP
1.0000 | Freq: Every day | NASAL | Status: DC
  Filled 2022-05-11: qty 16

## 2022-05-11 MED ORDER — POVIDONE-IODINE 10 % EX SWAB
2.0000 | Freq: Once | CUTANEOUS | Status: AC
  Administered 2022-05-11: 2 via TOPICAL

## 2022-05-11 MED ORDER — HYDROMORPHONE HCL 1 MG/ML IJ SOLN
INTRAMUSCULAR | Status: AC
  Administered 2022-05-11: 0.5 mg via INTRAVENOUS
  Filled 2022-05-11: qty 2

## 2022-05-11 MED ORDER — PROMETHAZINE HCL 25 MG/ML IJ SOLN: 6.2500 mg | INTRAMUSCULAR | Status: DC | PRN

## 2022-05-11 MED ORDER — PANTOPRAZOLE SODIUM 40 MG PO TBEC
40.0000 mg | DELAYED_RELEASE_TABLET | Freq: Every day | ORAL | Status: DC
  Administered 2022-05-13: 40 mg via ORAL
  Filled 2022-05-11: qty 1

## 2022-05-11 MED ORDER — ONDANSETRON HCL 4 MG/2ML IJ SOLN
INTRAMUSCULAR | Status: DC | PRN
  Administered 2022-05-11: 4 mg via INTRAVENOUS

## 2022-05-11 MED ORDER — HYDROMORPHONE HCL 1 MG/ML IJ SOLN
0.5000 mg | INTRAMUSCULAR | Status: DC | PRN
  Administered 2022-05-11 – 2022-05-12 (×3): 1 mg via INTRAVENOUS
  Filled 2022-05-11 (×3): qty 1

## 2022-05-11 MED ORDER — DEXAMETHASONE SODIUM PHOSPHATE 10 MG/ML IJ SOLN
INTRAMUSCULAR | Status: DC | PRN
  Administered 2022-05-11: 10 mg via INTRAVENOUS

## 2022-05-11 MED ORDER — ALUM & MAG HYDROXIDE-SIMETH 200-200-20 MG/5ML PO SUSP: 30.0000 mL | ORAL | Status: DC | PRN

## 2022-05-11 MED ORDER — STERILE WATER FOR IRRIGATION IR SOLN
Status: DC | PRN
  Administered 2022-05-11: 2000 mL

## 2022-05-11 MED ORDER — DOCUSATE SODIUM 100 MG PO CAPS
100.0000 mg | ORAL_CAPSULE | Freq: Two times a day (BID) | ORAL | Status: DC
  Administered 2022-05-11 – 2022-05-13 (×3): 100 mg via ORAL
  Filled 2022-05-11 (×3): qty 1

## 2022-05-11 MED ORDER — SODIUM CHLORIDE 0.9 % IV SOLN
INTRAVENOUS | Status: DC
  Administered 2022-05-11: 1000 mL via INTRAVENOUS

## 2022-05-11 MED ORDER — METOCLOPRAMIDE HCL 5 MG/ML IJ SOLN: 5.0000 mg | Freq: Three times a day (TID) | INTRAMUSCULAR | Status: DC | PRN

## 2022-05-11 MED ORDER — TRANEXAMIC ACID-NACL 1000-0.7 MG/100ML-% IV SOLN
1000.0000 mg | INTRAVENOUS | Status: AC
  Administered 2022-05-11: 1000 mg via INTRAVENOUS
  Filled 2022-05-11: qty 100

## 2022-05-11 MED ORDER — LIDOCAINE HCL (PF) 2 % IJ SOLN
INTRAMUSCULAR | Status: AC
  Filled 2022-05-11: qty 5

## 2022-05-11 MED ORDER — HYDROMORPHONE HCL 2 MG PO TABS
ORAL_TABLET | ORAL | Status: AC
  Administered 2022-05-11: 2 mg via ORAL
  Filled 2022-05-11: qty 1

## 2022-05-11 SURGICAL SUPPLY — 39 items
BAG COUNTER SPONGE SURGICOUNT (BAG) ×1 IMPLANT
BAG ZIPLOCK 12X15 (MISCELLANEOUS) IMPLANT
BENZOIN TINCTURE PRP APPL 2/3 (GAUZE/BANDAGES/DRESSINGS) IMPLANT
BLADE SAW SGTL 18X1.27X75 (BLADE) ×1 IMPLANT
COLLAR OFFSET CORAIL SZ 9 HIP (Stem) IMPLANT
CORAIL OFFSET COLLAR SZ 9 HIP (Stem) ×1 IMPLANT
COVER PERINEAL POST (MISCELLANEOUS) ×1 IMPLANT
COVER SURGICAL LIGHT HANDLE (MISCELLANEOUS) ×1 IMPLANT
CUP SECTOR GRIPTON 50MM (Cup) IMPLANT
DRAPE FOOT SWITCH (DRAPES) ×1 IMPLANT
DRAPE STERI IOBAN 125X83 (DRAPES) ×1 IMPLANT
DRAPE U-SHAPE 47X51 STRL (DRAPES) ×2 IMPLANT
DRSG AQUACEL AG ADV 3.5X10 (GAUZE/BANDAGES/DRESSINGS) ×1 IMPLANT
DURAPREP 26ML APPLICATOR (WOUND CARE) ×1 IMPLANT
ELECT REM PT RETURN 15FT ADLT (MISCELLANEOUS) ×1 IMPLANT
GAUZE XEROFORM 1X8 LF (GAUZE/BANDAGES/DRESSINGS) ×1 IMPLANT
GLOVE BIO SURGEON STRL SZ7.5 (GLOVE) ×1 IMPLANT
GLOVE BIOGEL PI IND STRL 8 (GLOVE) ×2 IMPLANT
GLOVE ECLIPSE 8.0 STRL XLNG CF (GLOVE) ×1 IMPLANT
GOWN STRL REUS W/ TWL XL LVL3 (GOWN DISPOSABLE) ×2 IMPLANT
GOWN STRL REUS W/TWL XL LVL3 (GOWN DISPOSABLE) ×2
HANDPIECE INTERPULSE COAX TIP (DISPOSABLE) ×1
HEAD FEMORAL 32 CERAMIC (Hips) IMPLANT
HOLDER FOLEY CATH W/STRAP (MISCELLANEOUS) ×1 IMPLANT
KIT TURNOVER KIT A (KITS) IMPLANT
LINER ACETABULAR 32X50 (Liner) IMPLANT
PACK ANTERIOR HIP CUSTOM (KITS) ×1 IMPLANT
SET HNDPC FAN SPRY TIP SCT (DISPOSABLE) ×1 IMPLANT
STAPLER VISISTAT 35W (STAPLE) IMPLANT
STRIP CLOSURE SKIN 1/2X4 (GAUZE/BANDAGES/DRESSINGS) IMPLANT
SUT ETHIBOND NAB CT1 #1 30IN (SUTURE) ×1 IMPLANT
SUT ETHILON 2 0 PS N (SUTURE) IMPLANT
SUT MNCRL AB 4-0 PS2 18 (SUTURE) IMPLANT
SUT VIC AB 0 CT1 36 (SUTURE) ×1 IMPLANT
SUT VIC AB 1 CT1 36 (SUTURE) ×1 IMPLANT
SUT VIC AB 2-0 CT1 27 (SUTURE) ×2
SUT VIC AB 2-0 CT1 TAPERPNT 27 (SUTURE) ×2 IMPLANT
TRAY FOLEY MTR SLVR 16FR STAT (SET/KITS/TRAYS/PACK) IMPLANT
YANKAUER SUCT BULB TIP NO VENT (SUCTIONS) ×1 IMPLANT

## 2022-05-11 NOTE — Op Note (Signed)
Operative Note  Date of operation: 05/11/2022 Operative diagnosis: Right hip primary osteoarthritis Postoperative diagnosis: Same  Procedure: Right direct anterior total hip arthroplasty  Implants: DePuy sector [place] acetabular clinic size 50, 32+0 polythene liner, size 9 [place] femoral component with high offset, 32+1 ceramic head ball  Surgeon: Lind Guest. Ninfa Linden, MD Assistant: Benita Stabile, PA-C  Anesthesia: General EBL: 350 cc Antibiotics: 2 g IV Ancef Complications: None  Indications: The patient is a 57 year old female well-known to me.  She has debilitating arthritis of her right hip this been well-documented.  She does have a left total hip arthroplasty we did not long ago.  She has had a leg length discrepancy with left operative side longer than the right side hopefully with the surgery we can correct the leg length difference.  Obviously the goals of surgery are first decreased pain, second improve mobility and quality of life with a stable hip and then third hopefully getting her leg length is equal as possible.  Having had this before she is fully aware the risk of acute blood loss anemia, nerve or vessel injury, fracture, infection, DVT, implant failure and skin and soft tissue issues.  Description after informed consent was obtained appropriate right hip was marked she was brought to the operating room and set up on the stretcher where spinal anesthesia was obtained.  She was laid in supine position on stretcher and a Foley catheter is placed.  I did meticulously assessed her leg lengths again and she is shorter on the right than the left.  A CT scanogram recently showed it only 0.27 cm difference but I do feel this is shorter than that.  Plan was assessed this significantly intraoperatively in terms of pre and intraoperative radiographs.  She is placed supine on the Hana operative table with a perineal post in place in both legs and inline skeletal traction vices no  traction applied.  Her right upper hip was again assessed radiographically as well as the pelvis so we can get good assessment of how we needed to lengthen her.  The right hip was then prepped and draped with DuraPrep and sterile drapes.  Timeout: She identified as correct patient correct right hip.  Incision was then made just inferior and posterior to the ASIS and carried slightly likely down the leg.  Dissection was carried down the tensor fascia lata muscle tension fascia was then divided longitudinally to proceed with a rotator approach the hip.  Circumflex vessels were identified and cauterized and hip capsule identified and opened up in L-type format finding a significant joint effusion.  Cobra retractors were placed around the medial lateral femoral neck and a femoral neck cut was made proximal to the lesser trochanter with an oscillating saw and completed with an osteotome.  A corkscrew guide was placed in the femoral head and the femoral head was removed in its entirety and there was a wide area devoid of cartilage and I think this is a significantly worsened over the last 6 months.  A bent Hohmann was then placed over the medial acetabular rim and remnants of the acetabular labrum and other debris removed.  We then began reaming under direct visualization and fluoroscopy from a size 43 reamer and stepwise increments going up to a size 49 reamer with again all reamers placed under direct visualization and the last reamer placed under direct fluoroscopy in order to obtain the depth of reaming, the inclination and the anteversion.  The real DePuy sector [place] acetabular component size  50 was then placed without difficulty and a 32+0 neutral polythene liner was placed.  Attention was then turned to the femur.  With the right leg externally rotated to 120 degrees, extended and adducted, and Mueller retractor was placed to the medial calcar and a long Hohmann was placed behind the greater trochanter.  The  lateral joint capsule was released and a box cutting osteotome was used in the femoral canal.  We then began broaching from a size 8 going on to a size 9.  She is young at 35 and her bone is definitely thick and hard.  We were able to then trial that size 9 femoral component and we went with a standard offset femoral neck and a 32+1 trial hip ball.  This was reduced in the pelvis and we assessed it radiographically and clinically.  We felt like we needed just little bit more offset but her leg lengths were definitely increased on that right side and it felt like we made her equal.  We dislocated the hip and remove the trial components.  We then placed the real [place] femoral component but 1 with high offset size 9 followed by a 32+1 ceramic head ball and again reduce this in the acetabulum it was nice and tight and stable assessing it radiographically and clinically.  We did increase her leg lengths and offset as well which we had hoped to do.  It felt like we were close to equal.  The soft tissue was then irrigated with normal saline solution.  The joint capsule was closed with interrupted #1 Vicryl suture followed by 0 Vicryl close deep tissue and 2-0 Vicryl close subcutaneous tissue.  The skin was closed with staples.  An Aquacel dressing was applied.  She was taken off the Hana table and taken recovery in stable addition.  Benita Stabile PA-C assisted in entire case and beginning to end and his assistance was crucial for soft tissue management and retraction, helping guide implant placement and a layered closure of the wound.

## 2022-05-11 NOTE — Interval H&P Note (Signed)
History and Physical Interval Note:    05/11/2022 7:07 AM  Jordan Vang  has presented today for surgery, with the diagnosis of right hip endstage osteoarthritis.  The various methods of treatment have been discussed with the patient and family. After consideration of risks, benefits and other options for treatment, the patient has consented to  Procedure(s): RIGHT TOTAL HIP ARTHROPLASTY ANTERIOR APPROACH (Right) as a surgical intervention.  The patient's history has been reviewed, patient examined, no change in status, stable for surgery.  I have reviewed the patient's chart and labs.  Questions were answered to the patient's satisfaction.     Mcarthur Rossetti

## 2022-05-11 NOTE — Interval H&P Note (Signed)
History and Physical Interval Note: Patient understands that she is here today for right hip replacement to treat her severe right hip arthritis.  There has been no acute or interval change in her medical status.  See H&P.  The risks and benefits of surgery been discussed in detail and informed consent is obtained.  The right operative hip has been marked.  05/11/2022 7:08 AM  Jordan Vang  has presented today for surgery, with the diagnosis of right hip endstage osteoarthritis.  The various methods of treatment have been discussed with the patient and family. After consideration of risks, benefits and other options for treatment, the patient has consented to  Procedure(s): RIGHT TOTAL HIP ARTHROPLASTY ANTERIOR APPROACH (Right) as a surgical intervention.  The patient's history has been reviewed, patient examined, no change in status, stable for surgery.  I have reviewed the patient's chart and labs.  Questions were answered to the patient's satisfaction.     Mcarthur Rossetti

## 2022-05-11 NOTE — Transfer of Care (Signed)
Immediate Anesthesia Transfer of Care Note  Patient: Franne Grip Launer  Procedure(s) Performed: RIGHT TOTAL HIP ARTHROPLASTY ANTERIOR APPROACH (Right: Hip)  Patient Location: PACU  Anesthesia Type:Spinal  Level of Consciousness: awake, alert , oriented, patient cooperative, and responds to stimulation  Airway & Oxygen Therapy: Patient Spontanous Breathing and Patient connected to face mask oxygen  Post-op Assessment: Report given to RN and Post -op Vital signs reviewed and stable  Post vital signs: Reviewed and stable  Last Vitals:  Vitals Value Taken Time  BP 149/96 05/11/22 0908  Temp 36.3 C 05/11/22 0908  Pulse 51 05/11/22 0912  Resp 13 05/11/22 0912  SpO2 100 % 05/11/22 0912  Vitals shown include unvalidated device data.  Last Pain:  Vitals:   05/11/22 0557  TempSrc:   PainSc: 0-No pain         Complications: No notable events documented.

## 2022-05-11 NOTE — Plan of Care (Signed)
  Problem: Education: Goal: Knowledge of the prescribed therapeutic regimen will improve Outcome: Progressing   Problem: Activity: Goal: Ability to avoid complications of mobility impairment will improve Outcome: Progressing   Problem: Pain Management: Goal: Pain level will decrease with appropriate interventions Outcome: Progressing   Problem: Skin Integrity: Goal: Will show signs of wound healing Outcome: Progressing   Problem: Activity: Goal: Risk for activity intolerance will decrease Outcome: Progressing   Problem: Elimination: Goal: Will not experience complications related to bowel motility Outcome: Progressing   Problem: Pain Managment: Goal: General experience of Jordan Vang will improve Outcome: Progressing

## 2022-05-11 NOTE — TOC Transition Note (Signed)
Transition of Care [facility]) - CM/SW Discharge Note  Patient Details  Name: Jordan Vang MRN: [id number] Date of Birth: [birth year]  [facility] CM/SW Contact:  Sherie Don, LCSW Phone Number: 05/11/2022, 3:44 PM  Clinical Narrative: Patient is expected to discharge home after working with PT. CSW met with patient to confirm discharge plan and needs. Patient will go home with HHPT, which was prearranged with [facility]. Patient has a rolling walker, toilet riser, and shower chair at home so there are no DME needs at this time. TOC signing off.    Final next level of care: Home w Home Health Services Barriers to Discharge: No Barriers Identified  Patient Goals and CMS Choice CMS Medicare.gov Compare Post Acute Care list provided to:: Patient Choice offered to / list presented to : Patient  Discharge Plan and Services Additional resources added to the After Visit Summary for          DME Arranged: N/A DME Agency: NA HH Arranged: PT HH Agency: Magazine features editor spoke with at [place] in orthopedist's office  Social Determinants of Health ([place]) Interventions SDOH Screenings   Food Insecurity: No Food Insecurity (05/11/2022)  Housing: Low Risk  (05/11/2022)  Transportation Needs: No Transportation Needs (05/11/2022)  Utilities: Not At Risk (05/11/2022)  Depression (PHQ2-9): Low Risk  (11/28/2021)  Tobacco Use: Low Risk  (05/11/2022)   Readmission Risk Interventions     No data to display

## 2022-05-11 NOTE — Evaluation (Signed)
Physical Therapy Evaluation Patient Details Name: Jordan Vang MRN: [id number] DOB: 07-26-1965 Today's Date: 05/11/2022  History of Present Illness  Pt is a 57yo female presenting s/p R-THA, AA on 05/11/22. PMH: OSA on CPAP, hypothyroidism, L-THA 2022, chronic pain syndrome,   Clinical Impression  Jordan Vang is a 57 y.o. female POD 0 s/p R-THA, AA. Patient reports independence with mobility at baseline. Patient is now limited by functional impairments (see PT problem list below) and requires min assist for bed mobility and min guard for transfers, further mobility deferred secondary to pain and pt reporting numbness in bilateral glutes, suspect secondary to spinal. Patient instructed in exercise to facilitate ROM and circulation to manage edema. Provided incentive spirometer and with Vcs pt able to achieve 2024m. Patient will benefit from continued skilled PT interventions to address impairments and progress towards PLOF. Acute PT will follow to progress mobility and stair training in preparation for safe discharge home.       Recommendations for follow up therapy are one component of a multi-disciplinary discharge planning process, led by the attending physician.  Recommendations may be updated based on patient status, additional functional criteria and insurance authorization.  Follow Up Recommendations Follow physician's recommendations for discharge plan and follow up therapies      Assistance Recommended at Discharge Frequent or constant Supervision/Assistance  Patient can return home with the following  A little help with walking and/or transfers;A little help with bathing/dressing/bathroom;Assistance with cooking/housework;Assist for transportation;Help with stairs or ramp for entrance    Equipment Recommendations None recommended by PT  Recommendations for Other Services       Functional Status Assessment Patient has had a recent decline in their functional status and/or  demonstrates limited ability to make significant improvements in function in a reasonable and predictable amount of time     Precautions / Restrictions Precautions Precautions: Fall Restrictions Weight Bearing Restrictions: No Other Position/Activity Restrictions: wbat      Mobility  Bed Mobility Overal bed mobility: Needs Assistance Bed Mobility: Supine to Sit     Supine to sit: HOB elevated, Min assist     General bed mobility comments: Min assist for bringing RLE off bed, pt attempted to use gait belt to self-assist but was unable secondary to pain.    Transfers Overall transfer level: Needs assistance Equipment used: Rolling walker (2 wheels) Transfers: Sit to/from Stand, Bed to chair/wheelchair/BSC Sit to Stand: Min guard, From elevated surface   Step pivot transfers: Min guard       General transfer comment: Min guard for safety, no physical assist required or overt LOB noted. Pt very fearful of weightbearing on RLE.    Ambulation/Gait               General Gait Details: deferred  Stairs            Wheelchair Mobility    Modified Rankin (Stroke Patients Only)       Balance Overall balance assessment: Needs assistance Sitting-balance support: No upper extremity supported, Feet supported Sitting balance-Leahy Scale: Good     Standing balance support: Reliant on assistive device for balance, During functional activity, Bilateral upper extremity supported Standing balance-Leahy Scale: Poor                               Pertinent Vitals/Pain Pain Assessment Pain Assessment: 0-10 Pain Score: 10-Worst pain ever Pain Location: right hip Pain Descriptors / Indicators: Operative site  guarding Pain Intervention(s): Limited activity within patient's tolerance, Monitored during session, Repositioned, Ice applied, Patient requesting pain meds-RN notified    Home Living Family/patient expects to be discharged to:: Private  residence Living Arrangements: Spouse/significant other;Children Available Help at Discharge: Family;Available 24 hours/day Type of Home: House Home Access: Stairs to enter Entrance Stairs-Rails: Right Entrance Stairs-Number of Steps: 4   Home Layout: One level Home Equipment: Conservation officer, nature (2 wheels);BSC/3in1;Shower seat - built in      Prior Function Prior Level of Function : Independent/Modified Independent             Mobility Comments: IND ADLs Comments: ind     Hand Dominance        Extremity/Trunk Assessment   Upper Extremity Assessment Upper Extremity Assessment: Overall WFL for tasks assessed    Lower Extremity Assessment Lower Extremity Assessment: LLE deficits/detail;RLE deficits/detail RLE Deficits / Details: MMT ank DF/PF 5/5, Pt reporting numbness to light touch in glutes and posterior knee RLE Sensation: decreased light touch LLE Deficits / Details: MMT ank DF/PF 5/5 LLE Sensation: WNL    Cervical / Trunk Assessment Cervical / Trunk Assessment: Normal  Communication   Communication: No difficulties  Cognition Arousal/Alertness: Awake/alert Behavior During Therapy: WFL for tasks assessed/performed Overall Cognitive Status: Within Functional Limits for tasks assessed                                          General Comments General comments (skin integrity, edema, etc.): Husband Stu present    Exercises Total Joint Exercises Ankle Circles/Pumps: AROM, Both, 10 reps   Assessment/Plan    PT Assessment Patient needs continued PT services  PT Problem List Decreased strength;Decreased range of motion;Decreased activity tolerance;Decreased balance;Decreased mobility;Pain       PT Treatment Interventions DME instruction;Gait training;Stair training;Functional mobility training;Therapeutic activities;Therapeutic exercise;Balance training;Neuromuscular re-education;Patient/family education    PT Goals (Current goals can be found  in the Care Plan section)  Acute Rehab PT Goals Patient Stated Goal: Return to hiking PT Goal Formulation: With patient Time For Goal Achievement: 05/18/22 Potential to Achieve Goals: Good    Frequency 7X/week     Co-evaluation               AM-PAC PT "6 Clicks" Mobility  Outcome Measure Help needed turning from your back to your side while in a flat bed without using bedrails?: None Help needed moving from lying on your back to sitting on the side of a flat bed without using bedrails?: A Little Help needed moving to and from a bed to a chair (including a wheelchair)?: A Little Help needed standing up from a chair using your arms (e.g., wheelchair or bedside chair)?: A Little Help needed to walk in hospital room?: A Little Help needed climbing 3-5 steps with a railing? : A Little 6 Click Score: 19    End of Session Equipment Utilized During Treatment: Gait belt Activity Tolerance: Patient limited by pain Patient left: in chair;with call bell/phone within reach;with family/visitor present Nurse Communication: Mobility status PT Visit Diagnosis: Pain;Difficulty in walking, not elsewhere classified (R26.2) Pain - Right/Left: Right Pain - part of body: Hip    Time: 7829-5621 PT Time Calculation (min) (ACUTE ONLY): 29 min   Charges:   PT Evaluation $PT Eval Low Complexity: 1 Low PT Treatments $Therapeutic Activity: 8-22 mins        Coolidge Breeze, PT, DPT  [facility] Office: [phone] Weekend pager: [phone]  Coolidge Breeze 05/11/2022, 3:45 PM

## 2022-05-11 NOTE — Anesthesia Postprocedure Evaluation (Signed)
Anesthesia Post Note  Patient: Jordan Vang  Procedure(s) Performed: RIGHT TOTAL HIP ARTHROPLASTY ANTERIOR APPROACH (Right: Hip)     Patient location during evaluation: PACU Anesthesia Type: Spinal Level of consciousness: awake and alert Pain management: pain level controlled Vital Signs Assessment: post-procedure vital signs reviewed and stable Respiratory status: spontaneous breathing Cardiovascular status: stable Anesthetic complications: no   No notable events documented.  Last Vitals:  Vitals:   05/11/22 1100 05/11/22 1140  BP: (!) 143/81 134/76  Pulse: (!) 57 60  Resp: 12 16  Temp:  36.7 C  SpO2: 98% 100%    Last Pain:  Vitals:   05/11/22 1339  TempSrc:   PainSc: [place]

## 2022-05-11 NOTE — Anesthesia Procedure Notes (Addendum)
Spinal  Patient location during procedure: OR Start time: 05/11/2022 7:17 AM End time: 05/11/2022 7:22 AM Reason for block: surgical anesthesia Staffing Performed: anesthesiologist  Anesthesiologist: Nolon Nations, MD Performed by: Nolon Nations, MD Authorized by: Nolon Nations, MD   Preanesthetic Checklist Completed: patient identified, IV checked, site marked, risks and benefits discussed, surgical consent, monitors and equipment checked, pre-op evaluation and timeout performed Spinal Block Prep: DuraPrep and site prepped and draped Patient monitoring: heart rate, continuous pulse ox and blood pressure Approach: right paramedian Location: L3-4 Injection technique: single-shot Needle Needle type: Spinocan  Needle gauge: 25 G Needle length: 9 cm Additional Notes Expiration date of kit checked and confirmed. Patient tolerated procedure well, without complications.

## 2022-05-12 ENCOUNTER — Other Ambulatory Visit: Payer: Self-pay | Admitting: Orthopaedic Surgery

## 2022-05-12 DIAGNOSIS — M1611 Unilateral primary osteoarthritis, right hip: Secondary | ICD-10-CM | POA: Diagnosis not present

## 2022-05-12 LAB — CBC
HCT: 32.4 % — ABNORMAL LOW (ref 36.0–46.0)
Hemoglobin: 10.4 g/dL — ABNORMAL LOW (ref 12.0–15.0)
MCH: 27.7 pg (ref 26.0–34.0)
MCHC: 32.1 g/dL (ref 30.0–36.0)
MCV: 86.2 fL (ref 80.0–100.0)
Platelets: 211 10*3/uL (ref 150–400)
RBC: 3.76 MIL/uL — ABNORMAL LOW (ref 3.87–5.11)
RDW: 13.6 % (ref 11.5–15.5)
WBC: 13.9 10*3/uL — ABNORMAL HIGH (ref 4.0–10.5)
nRBC: 0 % (ref 0.0–0.2)

## 2022-05-12 LAB — BASIC METABOLIC PANEL
Anion gap: 8 (ref 5–15)
BUN: 17 mg/dL (ref 6–20)
CO2: 25 mmol/L (ref 22–32)
Calcium: 8.8 mg/dL — ABNORMAL LOW (ref 8.9–10.3)
Chloride: 107 mmol/L (ref 98–111)
Creatinine, Ser: 0.59 mg/dL (ref 0.44–1.00)
GFR, Estimated: >60 mL/min (ref >60)
Glucose, Bld: 147 mg/dL — ABNORMAL HIGH (ref 70–99)
Potassium: 4.1 mmol/L (ref 3.5–5.1)
Sodium: 140 mmol/L (ref 135–145)

## 2022-05-12 MED ORDER — HYDROMORPHONE HCL 4 MG PO TABS: 4.0000 mg | ORAL_TABLET | ORAL | 0 refills | Status: DC | PRN

## 2022-05-12 MED ORDER — GABAPENTIN 100 MG PO CAPS: 100.0000 mg | ORAL_CAPSULE | Freq: Three times a day (TID) | ORAL | 0 refills | Status: DC | PRN

## 2022-05-12 MED ORDER — ASPIRIN 81 MG PO CHEW: 81.0000 mg | CHEWABLE_TABLET | Freq: Two times a day (BID) | ORAL | 0 refills | Status: DC

## 2022-05-12 MED ORDER — HYDROMORPHONE HCL 2 MG PO TABS
2.0000 mg | ORAL_TABLET | ORAL | Status: DC | PRN
  Administered 2022-05-12 – 2022-05-13 (×4): 4 mg via ORAL
  Filled 2022-05-12 (×4): qty 2

## 2022-05-12 MED ORDER — METHOCARBAMOL 500 MG PO TABS: 500.0000 mg | ORAL_TABLET | Freq: Four times a day (QID) | ORAL | 1 refills | Status: DC | PRN

## 2022-05-12 NOTE — Progress Notes (Signed)
Subjective: 1 Day Post-Op Procedure(s) (LRB): RIGHT TOTAL HIP ARTHROPLASTY ANTERIOR APPROACH (Right) Patient reports pain as moderate.    Objective: Vital signs in last 24 hours: Temp:  [98 F (36.7 C)-99.1 F (37.3 C)] 99 F (37.2 C) (01/20 0951) Pulse Rate:  [59-90] 67 (01/20 0951) Resp:  [15-18] 15 (01/20 0951) BP: (112-135)/(67-92) 116/92 (01/20 0951) SpO2:  [94 %-100 %] 100 % (01/20 0951)  Intake/Output from previous day: 01/19 0701 - 01/20 0700 In: 2428.4 [I.V.:2178.4; IV Piggyback:250] Out: 2956 [Urine:3600; Blood:350] Intake/Output this shift: Total I/O In: 472.8 [P.O.:240; I.V.:232.8] Out: 800 [Urine:800]  Recent Labs    05/12/22 0414  HGB 10.4*   Recent Labs    05/12/22 0414  WBC 13.9*  RBC 3.76*  HCT 32.4*  PLT 211   Recent Labs    05/12/22 0414  NA 140  K 4.1  CL 107  CO2 25  BUN 17  CREATININE 0.59  GLUCOSE 147*  CALCIUM 8.8*   No results for input(s): "LABPT", "INR" in the last 72 hours.  Sensation intact distally Intact pulses distally Dorsiflexion/Plantar flexion intact Incision: dressing C/D/I   Assessment/Plan: 1 Day Post-Op Procedure(s) (LRB): RIGHT TOTAL HIP ARTHROPLASTY ANTERIOR APPROACH (Right) Up with therapy Discharge home with home health      Mcarthur Rossetti 05/12/2022, 11:19 AM

## 2022-05-12 NOTE — Progress Notes (Signed)
05/12/22 1400  PT Visit Information  Last PT Received On 05/12/22  Assistance Needed PT returned after pt's pain meds given this pm; amb short distance to stairs, pt able to ascend one step only, in tears with c/o severe R hip  pain.  PT had pt descend one step to floor (d/t elevated pain level), and provided with seated rest, returned to room via recliner. Discussed with RN pt likely not ready for d/c today given elevated pain and diminished safety with mobility as a result  History of Present Illness Pt is a 57yo female presenting s/p R-THA, AA on 05/11/22. PMH: OSA on CPAP, hypothyroidism, L-THA 2022, chronic pain syndrome,  Subjective Data  Patient Stated Goal Return to hiking  Precautions  Precautions Fall  Restrictions  Other Position/Activity Restrictions wbat  Pain Assessment  Pain Assessment 0-10  Pain Score 9  Pain Location right hip  Pain Descriptors / Indicators Operative site guarding;Burning  Pain Intervention(s) Limited activity within patient's tolerance;Monitored during session;Premedicated before session;Repositioned;Ice applied  Cognition  Arousal/Alertness Awake/alert  Behavior During Therapy WFL for tasks assessed/performed  Overall Cognitive Status Within Functional Limits for tasks assessed  Bed Mobility  Overal bed mobility Needs Assistance  Bed Mobility Supine to Sit  Supine to sit Min guard  General bed mobility comments assist to bring RLE on to bed, incr time d/t pain  Transfers  Overall transfer level Needs assistance  Equipment used Rolling walker (2 wheels)  Transfers Sit to/from Stand  Sit to Stand Min guard  General transfer comment Min guard for safety, no physical assist required or overt LOB noted.cues for hand placement and RLE position. incr time d/t pain  Ambulation/Gait  Ambulation/Gait assistance Min guard  Gait Distance (Feet) 20 Feet  Assistive device Rolling walker (2 wheels)  Gait Pattern/deviations Step-to pattern  General Gait  Details difficulty advancing RLE d/t pain, incr time  Stairs Yes  Stairs assistance Min assist  Stair Management Step to pattern  Number of Stairs 1  General stair comments cues for  sequence, pt attempted  ascending  one step, in  too much pain to progress (has 4 steps at home). provided seated rest and returned to room in recliner  Balance  Sitting balance-Leahy Scale Good  Standing balance-Leahy Scale Fair  PT - End of Session  Equipment Utilized During Treatment Gait belt  Activity Tolerance Patient tolerated treatment well  Patient left with call bell/phone within reach;in chair;with chair alarm set  Nurse Communication Mobility status   PT - Assessment/Plan  PT Plan Current plan remains appropriate  PT Visit Diagnosis Pain;Difficulty in walking, not elsewhere classified (R26.2)  Pain - Right/Left Right  Pain - part of body Hip  PT Frequency (ACUTE ONLY) 7X/week  Follow Up Recommendations Follow physician's recommendations for discharge plan and follow up therapies  Assistance recommended at discharge Frequent or constant Supervision/Assistance  Patient can return home with the following A little help with walking and/or transfers;A little help with bathing/dressing/bathroom;Assistance with cooking/housework;Assist for transportation;Help with stairs or ramp for entrance  PT equipment None recommended by PT  AM-PAC PT "6 Clicks" Mobility Outcome Measure (Version 2)  Help needed turning from your back to your side while in a flat bed without using bedrails? 4  Help needed moving from lying on your back to sitting on the side of a flat bed without using bedrails? 3  Help needed moving to and from a bed to a chair (including a wheelchair)? 3  Help needed standing up  from a chair using your arms (e.g., wheelchair or bedside chair)? 3  Help needed to walk in hospital room? 3  Help needed climbing 3-5 steps with a railing?  3  6 Click Score 19  Consider Recommendation of Discharge To:  Home with [facility]  PT Goal Progression  Progress towards PT goals Progressing toward goals  Acute Rehab PT Goals  PT Goal Formulation With patient  Time For Goal Achievement 05/18/22  Potential to Achieve Goals Good  PT Time Calculation  PT Start Time (ACUTE ONLY) 1348  PT Stop Time (ACUTE ONLY) 1408  PT Time Calculation (min) (ACUTE ONLY) 20 min  PT General Charges  $$ ACUTE PT VISIT 1 Visit  PT Treatments  $Gait Training 8-22 mins

## 2022-05-12 NOTE — Discharge Instructions (Signed)

## 2022-05-12 NOTE — Discharge Summary (Addendum)
Patient ID: Jordan Vang MRN: [id number] DOB/AGE: 58-Apr-1967 58 y.o.  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 female who was admitted 05/11/2022 for operative treatment ofUnilateral primary osteoarthritis, right hip. Patient has severe unremitting pain that affects sleep, daily activities, and work/hobbies. After pre-op clearance the patient was taken to the operating room on 05/11/2022 and underwent  Procedure(s): RIGHT TOTAL HIP ARTHROPLASTY ANTERIOR APPROACH.    Patient was given perioperative antibiotics:  Anti-infectives (From admission, onward)    Start     Dose/Rate Route Frequency Ordered Stop   05/11/22 1400  ceFAZolin (ANCEF) IVPB 1 g/50 mL premix        1 g 100 mL/hr over 30 Minutes Intravenous Every 6 hours 05/11/22 1131 05/11/22 2202   05/11/22 0600  ceFAZolin (ANCEF) IVPB 2g/100 mL premix        2 g 200 mL/hr over 30 Minutes Intravenous On call to O.R. 05/11/22 0535 05/11/22 0724        Patient was given sequential compression devices, early ambulation, and chemoprophylaxis to prevent DVT.  Patient benefited maximally from hospital stay and there were no complications.    Recent vital signs: Patient Vitals for the past 24 hrs:  BP Temp Temp src Pulse Resp SpO2  05/12/22 0951 (!) 116/92 99 F (37.2 C)  Oral 67 15 100 %  05/12/22 0444 112/68 98.6 F (37 C) -- 75 16 96 %  05/12/22 0159 120/69 98.5 F (36.9 C) Oral 74 16 94 %  05/11/22 2236 127/67 99.1 F (37.3 C) Oral 90 17 100 %  05/11/22 1836 135/71 98.1 F (36.7 C) Oral (!) 59 18 97 %  05/11/22 1140 134/76 98 F (36.7 C) Oral 60 16 100 %     Recent laboratory studies:  Recent Labs    05/12/22 0414  WBC 13.9*  HGB 10.4*  HCT 32.4*  PLT 211  NA 140  K 4.1  CL 107  CO2 25  BUN 17  CREATININE 0.59  GLUCOSE 147*  CALCIUM 8.8*     Discharge Medications:   Allergies as of 05/12/2022       Reactions   Azithromycin    Flu like symptoms    Naproxen Swelling   edema in legs/ankles   Atorvastatin    Muscle breakdown    Celebrex [celecoxib]    Hypertension    Tape    Redness         Medication List     STOP taking these medications    meloxicam 15 MG tablet Commonly known as: MOBIC       TAKE these medications    aspirin 81 MG chewable tablet Chew 1 tablet (81 mg total) by mouth 2 (two) times daily.   CALCIUM 600 +  D PO Take 1 tablet by mouth at bedtime.   carboxymethylcellulose 0.5 % Soln Commonly known as: REFRESH PLUS Place 1 drop into both eyes 3 (three) times daily as needed (dry eyes).   COLLAGEN PO Take 1 capsule by mouth daily.   DEEP BLUE RELIEF EX Apply 1 Application topically daily as needed (pain).   fexofenadine 180 MG tablet Commonly known as: ALLEGRA Take 180 mg by mouth daily.   Fish Oil 1200 MG Caps Take 1,200 mg by mouth daily.   fluticasone 50 MCG/ACT nasal spray Commonly known as: FLONASE USE 1 SPRAY IN EACH NOSTRIL ONCE DAILY.   gabapentin 100 MG capsule Commonly known as: NEURONTIN Take 1 capsule (100 mg total) by mouth 3 (three) times daily as needed. For burning-type pain   HYDROmorphone 4 MG tablet Commonly known as: Dilaudid Take 1 tablet (4 mg total) by mouth every 4 (four) hours as needed for severe pain.   ketotifen 0.025 % ophthalmic  solution Commonly known as: ZADITOR Place 1 drop into both eyes 2 (two) times daily as needed (allergies).   levothyroxine 50 MCG tablet Commonly known as: SYNTHROID Take 1 tablet (50 mcg total) by mouth daily. Must keep 01/03/17 appt for future refills   MAG GLYCINATE PO Take 120 mg by mouth daily.   melatonin 3 MG Tabs tablet Take 3 mg by mouth at bedtime.   methocarbamol 500 MG tablet Commonly known as: ROBAXIN Take 1 tablet (500 mg total) by mouth every 6 (six) hours as needed for muscle spasms.   multivitamin with minerals Tabs tablet Take 1 tablet by mouth daily.   rosuvastatin 20 MG tablet Commonly known as: Crestor Take 1 tablet (20 mg total) by mouth daily. What changed: when to take this   sertraline 50 MG tablet Commonly known as: ZOLOFT Take 1 tablet (50 mg total) by mouth daily. What changed: when to take this   triamcinolone cream 0.1 % Commonly known as: KENALOG Apply 1 application topically 2 (two) times daily as needed (itching).   Vitamin D 50 MCG (2000 UT) tablet Take 2,000 Units by mouth daily.               Durable Medical Equipment  (From admission, onward)           Start     Ordered   05/11/22 1132  DME 3 n 1  Once        05/11/22 1131   05/11/22 1132  DME Walker rolling  Once       Question Answer Comment  Walker: With 5 Inch Wheels   Patient needs a walker to treat with the following condition Status post total replacement of right hip      05/11/22 1131            Diagnostic Studies: DG Pelvis Portable  Result Date: 05/11/2022 CLINICAL DATA:  Postoperative right hip replacement. EXAM: PORTABLE PELVIS 1-2 VIEWS COMPARISON:  AP pelvis 10/03/2021, MRI right hip 02/25/2022 FINDINGS: Interval total right hip arthroplasty. Redemonstration of total left hip arthroplasty. No perihardware lucency is seen to indicate hardware failure or loosening. Postoperative subcutaneous air about the right hip. Partial visualization of lateral  right hip surgical skin staples. Mild pubic symphysis joint space narrowing. No acute fracture or dislocation. IMPRESSION: Interval total right hip arthroplasty without evidence of hardware failure. Electronically Signed   By: Yvonne Kendall M.D.   On: 05/11/2022 09:57   DG HIP UNILAT WITH PELVIS 1V RIGHT  Result Date: 05/11/2022  CLINICAL DATA:  Right hip replacement. EXAM: DG HIP (WITH OR WITHOUT PELVIS) 1V RIGHT COMPARISON:  MRI right hip dated February 25, 2022. FLUOROSCOPY TIME:  Radiation Exposure Index (as provided by the fluoroscopic device): 2.0 mGy Kerma C-arm fluoroscopic images were obtained intraoperatively and submitted for post operative interpretation. FINDINGS: Multiple intraoperative fluoroscopic images demonstrate interval right total hip arthroplasty. Components are well aligned. No acute osseous abnormality. Prior left total hip arthroplasty. IMPRESSION: 1. Intraoperative fluoroscopic guidance for right total hip arthroplasty. Electronically Signed   By: Titus Dubin M.D.   On: 05/11/2022 08:49   DG C-Arm 1-60 Min-No Report  Result Date: 05/11/2022 Fluoroscopy was utilized by the requesting physician.  No radiographic interpretation.   DG C-Arm 1-60 Min-No Report  Result Date: 05/11/2022 Fluoroscopy was utilized by the requesting physician.  No radiographic interpretation.    Disposition: Discharge disposition: 01-Home or [facility] [place] Follow up.   Specialty: Home Health Services Why: [facility] will provide PT in the home after discharge. Contact information: [place] [address] [phone]         Mcarthur Rossetti, MD Follow up in 2 week(s).   Specialty: Orthopedic Surgery Contact information: [address] [place] [address] [phone]                  Signed: Mcarthur Rossetti 05/12/2022, 11:28 AM

## 2022-05-12 NOTE — Progress Notes (Signed)
05/12/22 1200  PT Visit Information  Last PT Received On 05/12/22  Assistance Needed +1  Incr time and effort d/t pain, will see again for stair training after next dose of pain meds   History of Present Illness Pt is a 57yo female presenting s/p R-THA, AA on 05/11/22. PMH: OSA on CPAP, hypothyroidism, L-THA 2022, chronic pain syndrome,  Subjective Data  Patient Stated Goal Return to hiking  Precautions  Precautions Fall  Restrictions  Other Position/Activity Restrictions wbat  Pain Assessment  Pain Assessment 0-10  Pain Score 6  Pain Location right hip  Pain Descriptors / Indicators Operative site guarding  Pain Intervention(s) Limited activity within patient's tolerance;Monitored during session;Repositioned;Patient requesting pain meds-RN notified;RN gave pain meds during session  Cognition  Arousal/Alertness Awake/alert  Behavior During Therapy [facility] for tasks assessed/performed  Overall Cognitive Status Within Functional Limits for tasks assessed  Bed Mobility  Overal bed mobility Needs Assistance  Supine to sit Min assist  General bed mobility comments assist to bring RLE on to bed, incr time d/t pain  Transfers  Overall transfer level Needs assistance  Equipment used Rolling walker (2 wheels)  Transfers Sit to/from Stand  Sit to Stand Min guard  General transfer comment Min guard for safety, no physical assist required or overt LOB noted.cues for hand placement and RLE position  Ambulation/Gait  Ambulation/Gait assistance Min guard  Gait Distance (Feet) 12 Feet (x2)  Assistive device Rolling walker (2 wheels)  Gait Pattern/deviations Step-to pattern  General Gait Details difficulty advancing RLE d/t pain, incr time  PT - End of Session  Equipment Utilized During Treatment Gait belt  Activity Tolerance Patient tolerated treatment well  Patient left in bed;with call bell/phone within reach;with bed alarm set  Nurse Communication Mobility status   PT - Assessment/Plan   PT Plan Current plan remains appropriate  PT Visit Diagnosis Pain;Difficulty in walking, not elsewhere classified (R26.2)  Pain - Right/Left Right  Pain - part of body Hip  PT Frequency (ACUTE ONLY) 7X/week  Follow Up Recommendations Follow physician's recommendations for discharge plan and follow up therapies  Assistance recommended at discharge Frequent or constant Supervision/Assistance  Patient can return home with the following A little help with walking and/or transfers;A little help with bathing/dressing/bathroom;Assistance with cooking/housework;Assist for transportation;Help with stairs or ramp for entrance  PT equipment None recommended by PT  AM-PAC PT "6 Clicks" Mobility Outcome Measure (Version 2)  Help needed turning from your back to your side while in a flat bed without using bedrails? 4  Help needed moving from lying on your back to sitting on the side of a flat bed without using bedrails? 3  Help needed moving to and from a bed to a chair (including a wheelchair)? 3  Help needed standing up from a chair using your arms (e.g., wheelchair or bedside chair)? 3  Help needed to walk in hospital room? 3  Help needed climbing 3-5 steps with a railing?  3  6 Click Score 19  Consider Recommendation of Discharge To: Home with [facility]  PT Goal Progression  Progress towards PT goals Progressing toward goals  Acute Rehab PT Goals  PT Goal Formulation With patient  Time For Goal Achievement 05/18/22  Potential to Achieve Goals Good  PT Time Calculation  PT Start Time (ACUTE ONLY) 1207  PT Stop Time (ACUTE ONLY) 1235  PT Time Calculation (min) (ACUTE ONLY) 28 min  PT General Charges  $$ ACUTE PT VISIT 1 Visit  PT Treatments  $  Gait Training 23-37 mins

## 2022-05-12 NOTE — Progress Notes (Addendum)
Physical Therapy Treatment Patient Details Name: Jordan Vang MRN: [id number] DOB: 1965-12-11 Today's Date: 05/12/2022   History of Present Illness Pt is a 57yo female presenting s/p R-THA, AA on 05/11/22. PMH: OSA on CPAP, hypothyroidism, L-THA 2022, chronic pain syndrome,    PT Comments    Pt progressing well, pt is pleased stating she feels her leg lengths are equal now.  Will see again in pm, anticipate pt will be ready to d/c home later today    Recommendations for follow up therapy are one component of a multi-disciplinary discharge planning process, led by the attending physician.  Recommendations may be updated based on patient status, additional functional criteria and insurance authorization.  Follow Up Recommendations  Follow physician's recommendations for discharge plan and follow up therapies     Assistance Recommended at Discharge Frequent or constant Supervision/Assistance  Patient can return home with the following A little help with walking and/or transfers;A little help with bathing/dressing/bathroom;Assistance with cooking/housework;Assist for transportation;Help with stairs or ramp for entrance   Equipment Recommendations  None recommended by PT    Recommendations for Other Services       Precautions / Restrictions Precautions Precautions: Fall Restrictions Weight Bearing Restrictions: No Other Position/Activity Restrictions: wbat     Mobility  Bed Mobility Overal bed mobility: Needs Assistance Bed Mobility: Supine to Sit     Supine to sit: Supervision     General bed mobility comments: able to self assist with gait belt as leg lifter, incr time no physical assist    Transfers Overall transfer level: Needs assistance Equipment used: Rolling walker (2 wheels) Transfers: Sit to/from Stand, Bed to chair/wheelchair/BSC Sit to Stand: Min guard, From elevated surface   Step pivot transfers: Min guard       General transfer comment: Min guard for  safety, no physical assist required or overt LOB noted. Pt very fearful of weightbearing on RLE.    Ambulation/Gait  Amb ~ 3' with RW and min/guard to supervision for safety. No LOB, steady with RW, ability to advance RLE improved with time and distance              General Gait Details: deferred   Stairs             Wheelchair Mobility    Modified Rankin (Stroke Patients Only)       Balance Overall balance assessment: Needs assistance Sitting-balance support: No upper extremity supported, Feet supported Sitting balance-Leahy Scale: Good     Standing balance support: Reliant on assistive device for balance, During functional activity, Bilateral upper extremity supported Standing balance-Leahy Scale: Fair                              Cognition Arousal/Alertness: Awake/alert Behavior During Therapy: WFL for tasks assessed/performed Overall Cognitive Status: Within Functional Limits for tasks assessed                                          Exercises Total Joint Exercises Ankle Circles/Pumps: AROM, Both, 10 reps Quad Sets: AROM, Both, 10 reps Heel Slides: AAROM, Right, 10 reps Hip ABduction/ADduction: AAROM, Right, 10 reps Long Arc Quad: Limitations Long Arc Quad Limitations: unable d/t pain and post op weakness    General Comments        Pertinent Vitals/Pain Pain Assessment Pain Assessment: 0-10 Pain Score:  4  Pain Location: right hip Pain Descriptors / Indicators: Operative site guarding Pain Intervention(s): Limited activity within patient's tolerance, Monitored during session, Premedicated before session, Repositioned, Ice applied    Home Living                          Prior Function            PT Goals (current goals can now be found in the care plan section) Acute Rehab PT Goals Patient Stated Goal: Return to hiking PT Goal Formulation: With patient Time For Goal Achievement:  05/18/22 Potential to Achieve Goals: Good Progress towards PT goals: Progressing toward goals    Frequency    7X/week      PT Plan Current plan remains appropriate    Co-evaluation              AM-PAC PT "6 Clicks" Mobility   Outcome Measure  Help needed turning from your back to your side while in a flat bed without using bedrails?: None Help needed moving from lying on your back to sitting on the side of a flat bed without using bedrails?: A Little Help needed moving to and from a bed to a chair (including a wheelchair)?: A Little Help needed standing up from a chair using your arms (e.g., wheelchair or bedside chair)?: A Little Help needed to walk in hospital room?: A Little Help needed climbing 3-5 steps with a railing? : A Little 6 Click Score: 19    End of Session Equipment Utilized During Treatment: Gait belt Activity Tolerance: Patient tolerated treatment well Patient left: in chair;with call bell/phone within reach;with family/visitor present;with chair alarm set Nurse Communication: Mobility status PT Visit Diagnosis: Pain;Difficulty in walking, not elsewhere classified (R26.2) Pain - Right/Left: Right Pain - part of body: Hip     Time: 1017-5102 PT Time Calculation (min) (ACUTE ONLY): 30 min  Charges:  $Gait Training: 8-22 mins $Therapeutic Exercise: 8-22 mins                     Baxter Flattery, PT  Acute Rehab Dept [facility]) [phone]  [facility] Weekend Pager (Saturday/Sunday only)  [phone]  05/12/2022    [facility] 05/12/2022, 10:31 AM

## 2022-05-13 DIAGNOSIS — M1611 Unilateral primary osteoarthritis, right hip: Secondary | ICD-10-CM | POA: Diagnosis not present

## 2022-05-13 NOTE — Progress Notes (Signed)
Subjective: 2 Days Post-Op Procedure(s) (LRB): RIGHT TOTAL HIP ARTHROPLASTY ANTERIOR APPROACH (Right) Patient reports pain as mild.  Pain is better controlled today.  She is mobilizing well.  Objective: Vital signs in last 24 hours: Temp:  [99 F (37.2 C)-99.8 F (37.7 C)] 99.8 F (37.7 C) (01/21 0509) Pulse Rate:  [64-89] 89 (01/21 0509) Resp:  [15-18] 18 (01/21 0509) BP: (116-132)/(67-92) 129/72 (01/21 0509) SpO2:  [97 %-100 %] 97 % (01/21 0509)  Intake/Output from previous day: 01/20 0701 - 01/21 0700 In: 1007.8 [P.O.:720; I.V.:232.8; IV Piggyback:55] Out: 1100 [Urine:1100] Intake/Output this shift: No intake/output data recorded.  Recent Labs    05/12/22 0414  HGB 10.4*    Recent Labs    05/12/22 0414  WBC 13.9*  RBC 3.76*  HCT 32.4*  PLT 211    Recent Labs    05/12/22 0414  NA 140  K 4.1  CL 107  CO2 25  BUN 17  CREATININE 0.59  GLUCOSE 147*  CALCIUM 8.8*    No results for input(s): "LABPT", "INR" in the last 72 hours.  Sensation intact distally Intact pulses distally Dorsiflexion/Plantar flexion intact Incision: dressing C/D/I   Assessment/Plan: 2 Days Post-Op Procedure(s) (LRB): RIGHT TOTAL HIP ARTHROPLASTY ANTERIOR APPROACH (Right)  Overall making significant improvements.  Likely plan for discharge home today.      Jordan Vang 05/13/2022, 8:57 AM

## 2022-05-13 NOTE — Progress Notes (Signed)
Physical Therapy Treatment Patient Details Name: Jordan Vang MRN: [id number] DOB: 11-Jan-1966 Today's Date: 05/13/2022   History of Present Illness Pt is a 57yo female presenting s/p R-THA, AA on 05/11/22. PMH: OSA on CPAP, hypothyroidism, L-THA 2022, chronic pain syndrome,    PT Comments    Pt feeling much better today, reports she is more confident; reviewed areas as noted below, pt and husband able to return demo stair technique safely. Pt and husband feel ready to d/c home   Recommendations for follow up therapy are one component of a multi-disciplinary discharge planning process, led by the attending physician.  Recommendations may be updated based on patient status, additional functional criteria and insurance authorization.  Follow Up Recommendations  Follow physician's recommendations for discharge plan and follow up therapies     Assistance Recommended at Discharge Frequent or constant Supervision/Assistance  Patient can return home with the following A little help with walking and/or transfers;A little help with bathing/dressing/bathroom;Assistance with cooking/housework;Assist for transportation;Help with stairs or ramp for entrance   Equipment Recommendations  None recommended by PT    Recommendations for Other Services       Precautions / Restrictions Precautions Precautions: Fall Restrictions Weight Bearing Restrictions: No RLE Weight Bearing: Weight bearing as tolerated     Mobility  Bed Mobility Overal bed mobility: Needs Assistance Bed Mobility: Supine to Sit     Supine to sit: Supervision, HOB elevated     General bed mobility comments: incr time, able to self assist RLE with gait belt    Transfers Overall transfer level: Needs assistance Equipment used: Rolling walker (2 wheels) Transfers: Sit to/from Stand Sit to Stand: Supervision           General transfer comment: self cues for correct hand placment    Ambulation/Gait Ambulation/Gait  assistance: Supervision Gait Distance (Feet): 35 Feet Assistive device: Rolling walker (2 wheels) Gait Pattern/deviations: Step-to pattern       General Gait Details: incr time to advance RLE, slow but steady, no LOB   Stairs Stairs: Yes Stairs assistance: Min guard, Min assist Stair Management: No rails, Step to pattern, Backwards, With walker Number of Stairs: 2 (x2) General stair comments: cues for sequence and technique. pt and husband able to return demo, good stability, no LOB   Wheelchair Mobility    Modified Rankin (Stroke Patients Only)       Balance     Sitting balance-Leahy Scale: Good     Standing balance support: Reliant on assistive device for balance, During functional activity, Bilateral upper extremity supported Standing balance-Leahy Scale: Fair                              Cognition Arousal/Alertness: Awake/alert Behavior During Therapy: WFL for tasks assessed/performed Overall Cognitive Status: Within Functional Limits for tasks assessed                                          Exercises      General Comments        Pertinent Vitals/Pain Pain Assessment Pain Assessment: Faces Faces Pain Scale: Hurts little more Pain Location: right hip Pain Descriptors / Indicators: Operative site guarding, Burning Pain Intervention(s): Limited activity within patient's tolerance, Monitored during session, Premedicated before session, Repositioned, Ice applied    Home Living  Prior Function            PT Goals (current goals can now be found in the care plan section) Acute Rehab PT Goals Patient Stated Goal: Return to hiking PT Goal Formulation: With patient Time For Goal Achievement: 05/18/22 Potential to Achieve Goals: Good Progress towards PT goals: Progressing toward goals    Frequency    7X/week      PT Plan Current plan remains appropriate    Co-evaluation               AM-PAC PT "6 Clicks" Mobility   Outcome Measure  Help needed turning from your back to your side while in a flat bed without using bedrails?: None Help needed moving from lying on your back to sitting on the side of a flat bed without using bedrails?: A Little Help needed moving to and from a bed to a chair (including a wheelchair)?: A Little Help needed standing up from a chair using your arms (e.g., wheelchair or bedside chair)?: A Little Help needed to walk in hospital room?: A Little Help needed climbing 3-5 steps with a railing? : A Little 6 Click Score: 19    End of Session Equipment Utilized During Treatment: Gait belt Activity Tolerance: Patient tolerated treatment well Patient left: with call bell/phone within reach;in chair;with chair alarm set;with family/visitor present   PT Visit Diagnosis: Pain;Difficulty in walking, not elsewhere classified (R26.2) Pain - Right/Left: Right Pain - part of body: Hip     Time: 2878-6767 PT Time Calculation (min) (ACUTE ONLY): 24 min  Charges:  $Gait Training: 23-37 mins                     Baxter Flattery, PT  Acute Rehab Dept [facility]) [phone]  [facility] Weekend Pager (Saturday/Sunday only)  [phone]  05/13/2022    [facility] 05/13/2022, 10:19 AM

## 2022-05-14 ENCOUNTER — Encounter (HOSPITAL_COMMUNITY): Payer: Self-pay | Admitting: Orthopaedic Surgery

## 2022-05-17 ENCOUNTER — Telehealth: Payer: Self-pay

## 2022-05-17 NOTE — Telephone Encounter (Signed)
Patient aware of the below message  

## 2022-05-17 NOTE — Telephone Encounter (Signed)
Jordan Vang sent message stating patient said her BP is a bit low but she is telling him she feels lethargic and sluggish, with no energy at all.  Had THA on 05/11/22

## 2022-05-22 ENCOUNTER — Encounter: Payer: Self-pay | Admitting: Orthopaedic Surgery

## 2022-05-24 ENCOUNTER — Ambulatory Visit (INDEPENDENT_AMBULATORY_CARE_PROVIDER_SITE_OTHER): Payer: 59 | Admitting: Orthopaedic Surgery

## 2022-05-24 ENCOUNTER — Encounter: Payer: Self-pay | Admitting: Orthopaedic Surgery

## 2022-05-24 DIAGNOSIS — Z96641 Presence of right artificial hip joint: Secondary | ICD-10-CM | POA: Insufficient documentation

## 2022-05-24 NOTE — Progress Notes (Signed)
The patient is here today for her first postoperative visit status post a right total hip arthroplasty.  We have already replaced her left hip in the past.  She is doing well overall.  She feels like her leg lengths have improved and are close to equal.  We do as well.  On exam her right hip incision looks good.  Steri-Strips have been placed after removing the staples.  Her calf is soft.  She has good motor and sensory function.  She will slowly increase her activities as [place] allows.  She is already off of narcotics so she can drive when she is comfortable doing so.  I would like to see her back in 4 weeks to see how she is doing overall.  We may consider outpatient physical therapy after then if she needs it.  She may not.

## 2022-06-08 ENCOUNTER — Ambulatory Visit (INDEPENDENT_AMBULATORY_CARE_PROVIDER_SITE_OTHER): Payer: 59 | Admitting: Family Medicine

## 2022-06-08 ENCOUNTER — Encounter: Payer: Self-pay | Admitting: Family Medicine

## 2022-06-08 VITALS — BP 122/64 | HR 78 | Temp 98.6°F | Resp 17 | Ht 62.5 in | Wt 204.0 lb

## 2022-06-08 DIAGNOSIS — E039 Hypothyroidism, unspecified: Secondary | ICD-10-CM

## 2022-06-08 DIAGNOSIS — E785 Hyperlipidemia, unspecified: Secondary | ICD-10-CM | POA: Diagnosis not present

## 2022-06-08 NOTE — Patient Instructions (Signed)
Scheduled your complete physical in 6 months We'll notify you of your lab results and make any changes if needed Continue to work on healthy diet and regular exercise- you can do it!!! Consider adding Glucosamine Chondroitin for bone and joint health Call with any questions or concerns Stay Safe!  Stay Healthy!! Happy Spring!!

## 2022-06-08 NOTE — Progress Notes (Unsigned)
   Subjective:    Patient ID: Jordan Vang, female    DOB: 1965-09-27, 57 y.o.   MRN: [id number]  HPI Hyperlipidemia- chronic problem, on Crestor 84m daily.  No CP, SOB, abd pain, N/V  Hypothyroid- chronic problem, on levothyroxine 532m daily.  Denies changes to skin/hair/nails.  Energy level is fair.  Obesity- pt has gained 10 lbs since October.  BMI now 36.72.  Given her hyperlipidemia and OSA, this qualifies as morbid obesity.  Currently in PT, doing some walking.   Review of Systems For ROS see HPI     Objective:   Physical Exam Vitals reviewed.  Constitutional:      General: She is not in acute distress.    Appearance: Normal appearance. She is well-developed. She is not ill-appearing.  HENT:     Head: Normocephalic and atraumatic.  Eyes:     Conjunctiva/sclera: Conjunctivae normal.     Pupils: Pupils are equal, round, and reactive to light.  Neck:     Thyroid: No thyromegaly.  Cardiovascular:     Rate and Rhythm: Normal rate and regular rhythm.     Pulses: Normal pulses.     Heart sounds: Normal heart sounds. No murmur heard. Pulmonary:     Effort: Pulmonary effort is normal. No respiratory distress.     Breath sounds: Normal breath sounds.  Abdominal:     General: There is no distension.     Palpations: Abdomen is soft.     Tenderness: There is no abdominal tenderness.  Musculoskeletal:     Cervical back: Normal range of motion and neck supple.     Right lower leg: No edema.     Left lower leg: No edema.  Lymphadenopathy:     Cervical: No cervical adenopathy.  Skin:    General: Skin is warm and dry.  Neurological:     Mental Status: She is alert and oriented to person, place, and time.  Psychiatric:        Behavior: Behavior normal.           Assessment & Plan:

## 2022-06-09 LAB — CBC WITH DIFFERENTIAL/PLATELET
Absolute Monocytes: 604 cells/uL (ref 200–950)
Basophils Absolute: 68 cells/uL (ref 0–200)
Basophils Relative: 0.8 %
Eosinophils Absolute: 179 cells/uL (ref 15–500)
Eosinophils Relative: 2.1 %
HCT: 33.9 % — ABNORMAL LOW (ref 35.0–45.0)
Hemoglobin: 11 g/dL — ABNORMAL LOW (ref 11.7–15.5)
Lymphs Abs: 2363 cells/uL (ref 850–3900)
MCH: 27.2 pg (ref 27.0–33.0)
MCHC: 32.4 g/dL (ref 32.0–36.0)
MCV: 83.7 fL (ref 80.0–100.0)
MPV: 11.7 fL (ref 7.5–12.5)
Monocytes Relative: 7.1 %
Neutro Abs: 5287 cells/uL (ref 1500–7800)
Neutrophils Relative %: 62.2 %
Platelets: 298 10*3/uL (ref 140–400)
RBC: 4.05 10*6/uL (ref 3.80–5.10)
RDW: 13.4 % (ref 11.0–15.0)
Total Lymphocyte: 27.8 %
WBC: 8.5 10*3/uL (ref 3.8–10.8)

## 2022-06-09 LAB — BASIC METABOLIC PANEL
BUN: 15 mg/dL (ref 7–25)
CO2: 25 mmol/L (ref 20–32)
Calcium: 9.4 mg/dL (ref 8.6–10.4)
Chloride: 109 mmol/L (ref 98–110)
Creat: 0.77 mg/dL (ref 0.50–1.03)
Glucose, Bld: 73 mg/dL (ref 65–99)
Potassium: 4.4 mmol/L (ref 3.5–5.3)
Sodium: 145 mmol/L (ref 135–146)

## 2022-06-09 LAB — HEPATIC FUNCTION PANEL
AG Ratio: 1.8 (calc) (ref 1.0–2.5)
ALT: 15 U/L (ref 6–29)
AST: 13 U/L (ref 10–35)
Albumin: 4.1 g/dL (ref 3.6–5.1)
Alkaline phosphatase (APISO): 82 U/L (ref 37–153)
Bilirubin, Direct: 0.1 mg/dL (ref 0.0–0.2)
Globulin: 2.3 g/dL (calc) (ref 1.9–3.7)
Indirect Bilirubin: 0.3 mg/dL (calc) (ref 0.2–1.2)
Total Bilirubin: 0.4 mg/dL (ref 0.2–1.2)
Total Protein: 6.4 g/dL (ref 6.1–8.1)

## 2022-06-09 LAB — LIPID PANEL
Cholesterol: 127 mg/dL (ref <200)
HDL: 57 mg/dL (ref >=50)
LDL Cholesterol (Calc): 48 mg/dL (calc)
Non-HDL Cholesterol (Calc): 70 mg/dL (calc) (ref <130)
Total CHOL/HDL Ratio: 2.2 (calc) (ref <5.0)
Triglycerides: 137 mg/dL (ref <150)

## 2022-06-09 LAB — TSH: TSH: 2.5 mIU/L (ref 0.40–4.50)

## 2022-06-10 NOTE — Assessment & Plan Note (Signed)
Chronic problem.  Currently on Crestor 60m daily w/o difficulty.  Check labs.  Adjust meds prn

## 2022-06-10 NOTE — Assessment & Plan Note (Signed)
Deteriorated.  Pt has gained 10 lbs since October.  BMI 36.72- which combined w/ hyperlipidemia and OSA qualifies as morbidly obese.  Stressed need for low carb diet and regular physical activity.  Check labs to risk stratify.  Will follow.

## 2022-06-10 NOTE — Assessment & Plan Note (Signed)
Chronic problem.  Currently on Levothyroxine 61mg daily.  Denies changes to skin/hair/nails.  Energy level is stable but not great.  Check labs.  Adjust meds prn

## 2022-06-11 NOTE — Progress Notes (Signed)
Pt seen results Via my chart  

## 2022-06-21 ENCOUNTER — Encounter: Payer: Self-pay | Admitting: Orthopaedic Surgery

## 2022-06-21 ENCOUNTER — Ambulatory Visit (INDEPENDENT_AMBULATORY_CARE_PROVIDER_SITE_OTHER): Payer: 59 | Admitting: Orthopaedic Surgery

## 2022-06-21 DIAGNOSIS — Z96641 Presence of right artificial hip joint: Secondary | ICD-10-CM

## 2022-06-21 MED ORDER — MELOXICAM 15 MG PO TABS: 15.0000 mg | ORAL_TABLET | Freq: Every day | ORAL | 3 refills | Status: DC

## 2022-06-21 NOTE — Progress Notes (Signed)
The patient is now 6 weeks status post a right total hip arthroplasty.  We have already replaced her left hip.  She is 57 years old and very active.  Her balance and coordination are now better given her leg lengths are back to equal.  She still deals with left knee arthritic issues.  She last had hyaluronic acid in the left knee in December of this past year.  She is walking without assistive device.  She is requesting meloxicam as an anti-inflammatory so we will send that into her therapist.  She is also requesting outpatient physical therapy with [facility] so I gave her prescription for this and I agree with her having therapy to work on bilateral lower extremity strengthening as well as her balance and coordination.  Both her right and left hip moves smoothly and fluidly.  Her left knee has no effusion with good range of motion but is definitely painful and has known moderate arthritis.  She is interested in some point seeing my partner Dr. Rolena Infante to consider PRP injection for her left knee.  From my standpoint I will see her back in 4 weeks to see how she is doing overall but no x-rays are needed.  I would have her wait for PRP injection until potentially later in the spring.  Will see what she looks like in 4 weeks.

## 2022-06-28 ENCOUNTER — Encounter: Payer: Self-pay | Admitting: Radiology

## 2022-07-08 ENCOUNTER — Other Ambulatory Visit: Payer: Self-pay | Admitting: Family Medicine

## 2022-07-23 ENCOUNTER — Encounter: Payer: Self-pay | Admitting: Orthopaedic Surgery

## 2022-07-23 ENCOUNTER — Ambulatory Visit (INDEPENDENT_AMBULATORY_CARE_PROVIDER_SITE_OTHER): Payer: 59 | Admitting: Orthopaedic Surgery

## 2022-07-23 DIAGNOSIS — Z96642 Presence of left artificial hip joint: Secondary | ICD-10-CM

## 2022-07-23 DIAGNOSIS — Z96641 Presence of right artificial hip joint: Secondary | ICD-10-CM

## 2022-07-23 NOTE — Progress Notes (Signed)
The patient is well-known to me.  She is about 2 and half months out or more from a right total hip arthroplasty.  We have replaced her left hip as well.  She now feels like she is much more balanced.  She still deals with some left knee issues.  She has had hyaluronic acid in both knees.  She still reports more significant numbness on her more recent operative right side.  She is walking with a more normal gait when I assessed her today.  She is only 57 years old.  She does not like to do a lot of hiking as well.  She said meloxicam has been helpful.  Examination of both hips show the move smoothly and fluidly.  Neither knee has an effusion.  Overall though she does look better.  She is going to contact my partner Dr. Elba Barman to consider PRP for the left knee.  From my standpoint for her hips, the next time I need to see her as an 3 months.  Will have a standing AP pelvis at that visit.  If there are issues before then she knows to let us know.  All questions and concerns were addressed and and answered.

## 2022-07-30 ENCOUNTER — Ambulatory Visit: Payer: 59 | Admitting: Sports Medicine

## 2022-07-30 ENCOUNTER — Encounter: Payer: Self-pay | Admitting: Sports Medicine

## 2022-07-30 VITALS — BP 126/84 | HR 62

## 2022-07-30 DIAGNOSIS — Z96642 Presence of left artificial hip joint: Secondary | ICD-10-CM

## 2022-07-30 DIAGNOSIS — M1712 Unilateral primary osteoarthritis, left knee: Secondary | ICD-10-CM

## 2022-07-30 DIAGNOSIS — S83249A Other tear of medial meniscus, current injury, unspecified knee, initial encounter: Secondary | ICD-10-CM

## 2022-07-30 DIAGNOSIS — M1711 Unilateral primary osteoarthritis, right knee: Secondary | ICD-10-CM

## 2022-07-30 NOTE — Progress Notes (Signed)
Jordan Castlenn S Mansel - 57 y.o. female MRN [id number]  Date of birth: 12/30/1965  Office Visit Note: Visit Date: 07/30/2022 PCP: Sheliah Hatchabori, Katherine E, MD Referred by: Sheliah Hatchabori, Katherine E, MD  Subjective: Chief Complaint  Patient presents with   consult about prp injections   HPI: Jordan Vang is a pleasant 57 y.o. female who presents today for left knee pain and osteoarthritis.  Seen previously by my partner, Dr. Katrinka BlazingSmith. She is about 2.5 months out from a right total hip arthroplasty, doing well. 05/11/22.  Had been physical therapy for this.  In terms of the left knee, she does have osteoarthritis.  She has had hyaluronic acid in both knees, last was in December 2023 (Euflexxa).  Does feel this may have helped the right knee slightly, although her left knee is still painful.  She has been on meloxicam 15 mg daily which she does find helpful.  She does tell me today she does meet criteria for SKOAP clinical trial that could be performed at [facility], she is interested in the evaluating this today.  Also has discussion on PRP, as she has know people who have benefited from this.  Pertinent ROS were reviewed with the patient and found to be negative unless otherwise specified above in HPI.   Assessment & Plan: Visit Diagnoses:  1. Primary osteoarthritis of left knee   2. History of left hip replacement   3. Primary osteoarthritis of right knee   4. Radial tear of medial meniscus    Plan: Had a good discussion with Pam today regarding her left greater than right knee pain.  She does have moderate osteoarthritic change, I reviewed her imaging and this seemed to have more issues over the patellofemoral joint as well as the medial aspect.  This is not bone-on-bone however.  She is having temporary relief from corticosteroid injection and only some relief from hyaluronic acid.  We discussed all treatment options today such as PRP injection therapy, Zilretta injection therapy, HA, Genicular nerve block  vs. FRA (SKOAP trial) physical therapy for the knees.  And does seem interested in PRP injection therapy, we did discuss this in detail today as well as provided her with a handout regarding pre and postinjection protocol.  She will do some research and think about this and let me know if she wishes to proceed.  She is aware that she will need to wait at least 10 days from her last anti-inflammatory before we proceed with this.  Whether or not we proceed with PRP injection therapy, likely will benefit from formalized physical therapy.  She can message or call me in the interim if any questions arise.  She will schedule this appointment or procedure at her leisure.  Follow-up: Return for Return at her convenience for knee pain/PRP > 10 days after last anti-inflammatory.   Meds & Orders: No orders of the defined types were placed in this encounter.  No orders of the defined types were placed in this encounter.    Procedures: No procedures performed      Clinical History: No specialty comments available.  She reports that she has never smoked. She has never used smokeless tobacco. No results for input(s): "HGBA1C", "LABURIC" in the last 8760 hours.  Objective:   Vital Signs: BP 126/84 (BP Location: Left Arm, Patient Position: Sitting, Cuff Size: Large)   Pulse 62   LMP 08/27/2016   Physical Exam  Gen: Well-appearing, in no acute distress; non-toxic CV:  Well-perfused. Warm.  Resp: Breathing unlabored on room air; no wheezing. Psych: Fluid speech in conversation; appropriate affect; normal thought process Neuro: Sensation intact throughout. No gross coordination deficits.   Ortho Exam - Bilateral knees: Trace effusion on the left knee, no effusion of the right knee.  There is patellofemoral crepitus bilaterally with knee flexion and extension.  Range of motion is fairly well-preserved.  There is a positive TTP over the medial joint line.  Knee strength 5/5  throughout.  Imaging:  *Independent review of three-view knee x-ray from 12/06/2021 was reviewed and interpreted by myself.  There is moderate tricompartmental change with some tibial spurring laterally anteriorly and posteriorly.  She has new severe arthritic change within the patellofemoral joint.  Narrative & Impression  CLINICAL DATA:  Bilateral knee pain.  No injury.   EXAM: RIGHT KNEE 3 VIEWS   COMPARISON:  None Available.   FINDINGS: Moderate tricompartmental degenerative change with joint space narrowings and osseous spurring, most pronounced at the patellofemoral compartment. No acute-appearing osseous abnormality. Probable small joint effusion.   IMPRESSION: 1. Moderate tricompartmental degenerative joint disease at the RIGHT knee, most pronounced at the patellofemoral compartment. 2. Probable small joint effusion. 3. No acute findings.     Electronically Signed   By: Bary Richard M.D.   On: 12/08/2021 10:08    Narrative & Impression  CLINICAL DATA:  Chronic left knee pain and weakness. Decreased mobility.   EXAM: MRI OF THE LEFT KNEE WITHOUT CONTRAST   TECHNIQUE: Multiplanar, multisequence MR imaging of the knee was performed. No intravenous contrast was administered.   COMPARISON:  None Available.   FINDINGS: MENISCI   Medial: Large radial tear of the posterior horn of the medial meniscus with peripheral meniscal extrusion. Degeneration of the posterior horn of the medial meniscus.   Lateral: Intact.   LIGAMENTS   Cruciates: Intact ACL and PCL. Severe expansion of the ACL and increased signal as can be seen with mucinous degeneration.   Collaterals: Medial collateral ligament is intact. Lateral collateral ligament complex is intact.   CARTILAGE   Patellofemoral: Partial-thickness cartilage loss of the patellofemoral compartment with areas of full-thickness cartilage loss. Subchondral reactive marrow changes in the lateral patellofemoral  compartment. Small marginal osteophytes.   Medial: Partial-thickness cartilage loss of the medial femorotibial compartment with areas of high-grade partial-thickness cartilage loss of the weight-bearing surface of the medial femoral condyle. Small marginal osteophytes.   Lateral: Partial thickness cartilage loss of the lateral femorotibial compartment with areas of full-thickness cartilage fissuring along the lateral tibial plateau. Small marginal osteophytes.   JOINT: No joint effusion. Normal Hoffa's fat-pad. No plical thickening. 14 mm ganglion cyst along the posterior joint capsule.   POPLITEAL FOSSA: Popliteus tendon is intact. No Baker's cyst.   EXTENSOR MECHANISM: Intact quadriceps tendon. Intact patellar tendon. Intact lateral patellar retinaculum. Intact medial patellar retinaculum. Intact MPFL.   BONES: No aggressive osseous lesion. No fracture or dislocation.   Other: No fluid collection or hematoma. Muscles are normal.   IMPRESSION: 1. Large radial tear of the posterior horn of the medial meniscus with peripheral meniscal extrusion. 2. Tricompartmental cartilage abnormalities as described above.     Electronically Signed   By: Elige Ko M.D.   On: 02/01/2022 08:57    Narrative & Impression  CLINICAL DATA:  Bilateral knee pain   EXAM: LEFT KNEE 3 VIEWS   COMPARISON:  None Available.   FINDINGS: Frontal, lateral, and sunrise views of the left knee are obtained. No acute fracture, subluxation, or dislocation.  There is moderate 3 compartmental osteoarthritis greatest in the patellofemoral compartment. No effusion. Soft tissues are unremarkable.   IMPRESSION: 1. Moderate 3 compartmental osteoarthritis. No acute bony abnormality.     Electronically Signed   By: Sharlet Salina M.D.   On: 12/08/2021 10:08    Past Medical/Family/Surgical/Social History: Medications & Allergies reviewed per EMR, new medications updated. Patient Active Problem List    Diagnosis Date Noted   Status post total replacement of right hip 05/24/2022   Chronic pain of both knees 12/05/2021   Right hip pain 12/05/2021   Physical exam 11/28/2021   Acquired hypothyroidism 05/30/2021   Adverse effect of antihyperlipidemic and antiarteriosclerotic drugs, initial encounter 05/30/2021   Allergic rhinitis 05/30/2021   Chronic pain syndrome 05/30/2021   Dyslipidemia 05/30/2021   Myalgia 05/30/2021   Primary osteoarthritis 05/30/2021   Recurrent major depression in remission 05/30/2021   Solitary pulmonary nodule 05/30/2021   Sleep pattern disturbance 05/30/2021   OSA (obstructive sleep apnea) 05/30/2021   Status post left hip replacement 08/02/2020   B12 deficiency 11/22/2015   Fatigue 08/16/2015   Basal cell carcinoma, lip 10/28/2013   Morbid obesity 10/28/2013   Past Medical History:  Diagnosis Date   Allergy    Anemia    Arthritis    Cancer    basal cell cancers, MOH's on lip, several on back   Elevated cholesterol    Hypothyroidism    PMDD (premenstrual dysphoric disorder)    Dr Audie Box   Sleep apnea    CPAP   Thyroid disease    Family History  Problem Relation Age of Onset   Hearing loss Mother    Depression Mother    Cancer Mother    Breast cancer Mother        Age 32   Lung cancer Mother        smoker   Osteoporosis Mother    Arthritis Father    Hyperlipidemia Father    Stroke Father 69   Hyperlipidemia Sister    Hyperlipidemia Brother    Heart attack Maternal Grandfather        < 55 ?   Diabetes Neg Hx    Colon cancer Neg Hx    Past Surgical History:  Procedure Laterality Date   BASAL CELL CARCINOMA EXCISION     Mohs surgery on Lip   KNEE SURGERY Right 04/23/2013   arthroscopy   TONSILLECTOMY     TOTAL HIP ARTHROPLASTY Left 08/02/2020   Procedure: LEFT TOTAL HIP ARTHROPLASTY ANTERIOR APPROACH;  Surgeon: Kathryne Hitch, MD;  Location: [facility] OR;  Service: Orthopedics;  Laterality: Left;   TOTAL HIP ARTHROPLASTY Right  05/11/2022   Procedure: RIGHT TOTAL HIP ARTHROPLASTY ANTERIOR APPROACH;  Surgeon: Kathryne Hitch, MD;  Location: [facility];  Service: Orthopedics;  Laterality: Right;   Social History   Occupational History   Not on file  Tobacco Use   Smoking status: Never   Smokeless tobacco: Never  Vaping Use   Vaping Use: Never used  Substance and Sexual Activity   Alcohol use: Yes    Alcohol/week: 0.0 standard drinks of alcohol    Comment: socially   Drug use: No   Sexual activity: Yes    Birth control/protection: Post-menopausal    Comment: intercourse age 26, sexual partners less than 5   I spent 39 minutes in the care of the patient today including face-to-face time, preparation to see the patient, as well as reviewing previous x-rays of the knee, previous MRI of  the knee, discussion on all treatment options including Ortho Biologics, PRP, review of SKOAP study information she provided, and discussion on alternative treatment and holistic options for the above diagnoses.   Madelyn Brunner, DO Primary Care Sports Medicine Physician  [facility]  This note was dictated using Dragon naturally speaking software and may contain errors in syntax, spelling, or content which have not been identified prior to signing this note.

## 2022-07-30 NOTE — Patient Instructions (Addendum)
Dr. Shon Baton' Instructions and What to Expect for PRP Injections:  Platelet-rich plasma is used in musculoskeletal medicine to focus your own body's ability to heal. It has several well-done published randomized control trials (RCT) which demonstrate both its effectiveness and safety in many musculoskeletal conditions, including osteoarthritis, tendinopathies, and damaged vertebral discs. PRP has been in clinical use since the 1990's. Many people know that platelets form a clot if there is a cut in the skin. It turns out that platelets do not only form a clot, they also start the body's own repair process. When platelets activate to form a clot, they also release alpha granules which have hundreds of chemical messengers in them that initiate and organize repair to the damaged tissue. Precisely placing PRP into the site of injury will initiate the healing process by activating on the damaged cartilage or tendon. This is an inflammatory process, and inflammation is the vital first phase of Healing.  What to expect and how to prepare for PRP   2 weeks prior to the procedure: depending on the procedure, you may need to arrange for a driver to bring you home. IF you are having a lower extremity procedure, we can provide crutches or a cane as needed.   10 days prior to the procedure: Stop taking anti-inflammatory drugs like Ibuprofen/Motrin, Advil, Naprosyn, Celebrex, or Meloxicam. Even aspirin should be stopped (but need to discuss this with Dr. Shon Baton and your cardiologist beforehand). Let Dr. Shon Baton know if you have been taking prednisone or other corticosteroids in the last month.   The day before the procedure: thoroughly shower and clean your skin.    The day of the procedure: Wear loose-fitting clothing like sweatpants or shorts. If you are having an upper body procedure wear a top that can button or zip up.  PRP will initiate healing and a productive inflammation, and PRP therapy will  make the body part treated sore for 4 days to two weeks. Anti-inflammatory drugs (i.e. ibuprofen, Naprosyn, Celebrex) and corticosteroids such as prednisone can blunt or stop this process, so it is important to not take any anti-inflammatory drugs for 7 days before getting PRP therapy, or for at least three weeks after PRP therapy. Corticosteroid injections can blunt inflammation for 30 days, so let us know if you have had one recently. Depending on the body part injected, you may be in a sling or on crutches for several days. Just like wringing out a wet dishcloth, if you load or tense a tendon or ligament that has just been injected with PRP, some of the PRP injected will squish out. By keeping the body part treated relaxed by using a sling (for the shoulder or arm) or crutches (for hips and legs) for a few days, the PRP can bind in place and do its job.   You may need a driver to bring you home if this is your dominant leg.  Tobacco/nicotine is a potent toxin and its use constricts small blood vessels which are needed for tissue repair.  Tobacco/nicotine use will limit the effectiveness of any treatment and stopping tobacco use is one of the single  greatest actions you can take to improve your health. Avoid toxins like alcohol, which inhibits and depresses the cells needed for tissue repair.  What happens during the PRP procedure?  Platelet rich plasma is made by taking some of your blood and performing a two-stage centrifuge process on it to concentrate the PRP. First, your blood is drawn into a syringe  with a small amount of anti-coagulant in it (this is to keep the blood from clotting during this process). The amount of blood drawn is usually about 10-30 milliliters, depending on how much PRP is needed for the treatment.  (There are 355 milliliters in a 12-ounce soda can for comparison).  Then the blood is transferred in a sterile fashion into a centrifuge tube. It is then  centrifuged for the first cycle where the red blood cells are isolated and discarded. In the second centrifuge cycle, the platelet-rich fraction of the remaining plasma is concentrated and placed in a syringe. The skin at the injection site is numbed with a small amount of topical cooling spray. Dr. Shon Baton will then precisely inject the PRP into the injury site using ultrasound guidance.  What to do after your procedure  I will give you specific medicine to control any discomfort you may have after the procedure. Avoid NSAIDs like ibuprofen. Acetaminophen can be used for mild pain.  Depending on the part of the body treated, you may need crutches or a cane for the first 1-3 days, but not always. Do your best not to tense or load the treated area during this time. After 3 days, unless otherwise instructed, the treated body part should be used and slowly moved through its full range of motion. It will be sore, but you will not be doing damage by moving it, in fact it needs to move to heal. If you were on crutches for a period of time, walking is ok once you are off the crutches. For now, avoid activities that specifically hurt you before being treated. Exercise is vital to good health and finding a way to cross train around your injury is important not only for your physical health, but for your mental health as well. Ask me about cross training options for your injury. Some brief (10 minutes or less) period of heat or ice therapy will not hurt the therapy, but it is not required. Usually, depending on the initial injury, physical therapy is started from two weeks to four weeks after injection. Improvements in pain and function should be expected from 8 weeks to 12 weeks after injection and some injuries may require more than one treatment.    *Let me know if you have any questions, Dr. Shon Baton

## 2022-08-15 ENCOUNTER — Encounter: Payer: Self-pay | Admitting: Nurse Practitioner

## 2022-08-15 ENCOUNTER — Ambulatory Visit: Payer: 59 | Admitting: Nurse Practitioner

## 2022-08-15 VITALS — BP 128/82 | HR 72 | Resp 20

## 2022-08-15 DIAGNOSIS — R300 Dysuria: Secondary | ICD-10-CM

## 2022-08-15 DIAGNOSIS — N898 Other specified noninflammatory disorders of vagina: Secondary | ICD-10-CM

## 2022-08-15 LAB — WET PREP FOR TRICH, YEAST, CLUE

## 2022-08-15 NOTE — Telephone Encounter (Signed)
Spoke with patient, OV scheduled for today at 1400 with Tiffany, NP.     Encounter closed.

## 2022-08-15 NOTE — Progress Notes (Signed)
   Acute Office Visit  Subjective:    Patient ID: Jordan Vang, female    DOB: 18-Jan-1966, 57 y.o.   MRN: [id number]   HPI 57 y.o. presents today for what she thinks is a watery vaginal discharge x 3-4 days. Denies odor or itching. Slight burning with urination. Recent oral sex with digital penetration. Did notice a very small amount of blood on panty liner 24 hours after encounter. Notices the watery discharge is worse at night. Denies frequency, urgency, or hematuria.   Patient's last menstrual period was 08/27/2016.    Review of Systems  Constitutional: Negative.   Genitourinary:  Positive for dysuria, vaginal bleeding (spotting x 1 day after intercourse) and vaginal discharge. Negative for flank pain, frequency, genital sores, hematuria and urgency.       Objective:    Physical Exam Constitutional:      Appearance: Normal appearance.  Genitourinary:    General: Normal vulva.     Vagina: Erythema and bleeding present. No vaginal discharge.     Cervix: Normal.     BP 128/82 (BP Location: Right Arm, Patient Position: Sitting)   Pulse 72   Resp 20   LMP 08/27/2016  Wt Readings from Last 3 Encounters:  06/08/22 204 lb (92.5 kg)  05/11/22 201 lb (91.2 kg)  05/08/22 201 lb (91.2 kg)        Patient informed chaperone available to be present for breast and/or pelvic exam. Patient has requested no chaperone to be present. Patient has been advised what will be completed during breast and pelvic exam.   Wet prep negative for pathogens  UA 1+ leukocytes, negative nitrites, trace blood, yellow/slightly cloudy. Microscopic: wbc 6-10, rbc 0-2, few bacteria  Assessment & Plan:   Problem List Items Addressed This Visit   None Visit Diagnoses     Vaginal discharge    -  Primary   Relevant Orders   WET PREP FOR TRICH, YEAST, CLUE (Completed)   Burning with urination       Relevant Orders   Urinalysis,Complete w/RFL Culture (Completed)      Plan: Wet prep negative for  pathogens. UA has small amount of leukocytes, will wait on culture to treat. Small amount of blood present in vaginal canal, appears to be from atrophic changes/friction from intercourse. Consider vaginal estrogen.      Olivia Mackie DNP, 3:13 PM 08/15/2022

## 2022-08-17 LAB — URINALYSIS, COMPLETE W/RFL CULTURE
Bilirubin Urine: NEGATIVE
Glucose, UA: NEGATIVE
Hyaline Cast: NONE SEEN /LPF
Ketones, ur: NEGATIVE
Nitrites, Initial: NEGATIVE
Protein, ur: NEGATIVE
Specific Gravity, Urine: 1.015 (ref 1.001–1.035)
pH: 6 (ref 5.0–8.0)

## 2022-08-17 LAB — URINE CULTURE
MICRO NUMBER:: 14867802
Result:: NO GROWTH
SPECIMEN QUALITY:: ADEQUATE

## 2022-08-17 LAB — CULTURE INDICATED

## 2022-08-20 ENCOUNTER — Other Ambulatory Visit: Payer: Self-pay | Admitting: Nurse Practitioner

## 2022-08-20 DIAGNOSIS — N950 Postmenopausal bleeding: Secondary | ICD-10-CM

## 2022-08-20 DIAGNOSIS — N762 Acute vulvitis: Secondary | ICD-10-CM

## 2022-08-20 MED ORDER — CLOBETASOL PROPIONATE 0.05 % EX OINT: 1.0000 | TOPICAL_OINTMENT | Freq: Two times a day (BID) | CUTANEOUS | 0 refills | Status: DC

## 2022-08-21 ENCOUNTER — Telehealth: Payer: Self-pay

## 2022-08-21 DIAGNOSIS — N949 Unspecified condition associated with female genital organs and menstrual cycle: Secondary | ICD-10-CM

## 2022-08-21 DIAGNOSIS — N950 Postmenopausal bleeding: Secondary | ICD-10-CM

## 2022-08-21 DIAGNOSIS — N898 Other specified noninflammatory disorders of vagina: Secondary | ICD-10-CM

## 2022-08-21 NOTE — Telephone Encounter (Signed)
Gyn u/s order placed with [facility].  My Chart message sent to patient letting her know order placed and they will call her to schedule or she can call them. Phone number and prompts provided.

## 2022-08-21 NOTE — Telephone Encounter (Signed)
----   Message -----  From: Olivia Mackie, NP  Sent: 08/20/2022   4:05 PM EDT  To: Jerilynn Mages  Subject: RE: Ultrasound                                 Understandable. Please have sent to [facility] to be seen sooner. Thanks.  ----- Message -----  From: Jerilynn Mages  Sent: 08/20/2022   3:36 PM EDT  To: Olivia Mackie, NP  Subject: RE: Ultrasound                                 Our first [place] available is June 6 patient does not want to wait until than wants to be sent to an outside facility for sooner appointment.  ----- Message -----  From: Olivia Mackie, NP  Sent: 08/20/2022   2:21 PM EDT  To: [facility]  Subject: Ultrasound                                     Please call to schedule ultrasound for PMB. Thanks.

## 2022-08-21 NOTE — Telephone Encounter (Signed)
Patient is scheduled 09/18/22 at [facility] for  pelvic u/s.  Would you like me to have appt desk call her to arrange visit after the u/s to meet with you for results/plan?

## 2022-08-21 NOTE — Telephone Encounter (Signed)
Message sent to appt desk to arrange visit.

## 2022-08-21 NOTE — Telephone Encounter (Signed)
Yes please schedule OV to discuss. Thanks.

## 2022-08-23 NOTE — Telephone Encounter (Signed)
Appointment desk called her to schedule follow visit for after u/s. They transferred her to triage voicemail.  She states she cannot get u/s schedule til end of May. Her bleeding has stopped but still with discharge and "very painful". "Not sure what to do".

## 2022-08-24 MED ORDER — FLUCONAZOLE 150 MG PO TABS: 150.0000 mg | ORAL_TABLET | Freq: Once | ORAL | 0 refills | Status: AC

## 2022-08-24 MED ORDER — CLINDAMYCIN PHOSPHATE 2 % VA CREA: 1.0000 | TOPICAL_CREAM | Freq: Every day | VAGINAL | 0 refills | Status: AC

## 2022-08-24 NOTE — Telephone Encounter (Signed)
Per ML: "Please make sure that patient is applying a thin layer of Clobetasol on the vulva twice a day x 7 days. Also, make sure patient understands that her wet prep and U. Culture were Negative. Can send Clinda 2% cream vaginally HS x 3.  Fluconazole 150 mg x 1 post Clinda.  That is in case she developed a BV since her last visit. Dr. Elbert Ewings"  Called pt, no answer, LDVM on machine per DPR. Rxs sent to [facility]. Advised pt to return call with any additional questions concerns.   Will route to provider for final review and close encounter.

## 2022-08-24 NOTE — Telephone Encounter (Signed)
TW pt LVM in triage line stating that she called [facility] and vaginal abx has not been sent in. Pt reports that she would like for this to be resolved today d/t not wanting to go through weekend with sxs.   FYI. Pt was seen by TW on 08/15/22 for vaginal d/c and dysuria post oral sex w/ digital penetration.   Wet prep and ua/ucx returned neg. However, still experiencing discharge and vaginal discomfort. TW rx'd clobetasol for vulvitis and recommended vaginal abx for sxs but did not get sent before leaving office yesterday. Please review/advise. Thank you.

## 2022-08-30 ENCOUNTER — Ambulatory Visit: Payer: 59

## 2022-08-30 DIAGNOSIS — N950 Postmenopausal bleeding: Secondary | ICD-10-CM

## 2022-09-05 ENCOUNTER — Ambulatory Visit: Payer: 59 | Admitting: Nurse Practitioner

## 2022-09-05 VITALS — BP 104/68

## 2022-09-05 DIAGNOSIS — R0602 Shortness of breath: Secondary | ICD-10-CM | POA: Diagnosis not present

## 2022-09-05 DIAGNOSIS — N950 Postmenopausal bleeding: Secondary | ICD-10-CM | POA: Diagnosis not present

## 2022-09-05 DIAGNOSIS — D509 Iron deficiency anemia, unspecified: Secondary | ICD-10-CM | POA: Diagnosis not present

## 2022-09-05 NOTE — Progress Notes (Signed)
   Acute Office Visit  Subjective:    Patient ID: Jordan Vang, female    DOB: 01/11/1966, 57 y.o.   MRN: [id number]   HPI 57 y.o. presents today to discuss pelvic ultrasound from 08/30/2022. Seen 08/15/22 with complaints of light vaginal bleeding after recent oral intercourse with digital penetration. Bleeding was light but did occur more than once. Was also having watery discharge. Had a small amount of bleeding when using clindamycin vaginal inserts but none since. All vaginal symptoms have resolved. Does report episodes of shortness of break for about a month. It is not related to activity, is mild, denies chest pain, palpitation or cough. No recent URI. H/O anemia (last hgb 11.0 in February).   Patient's last menstrual period was 08/27/2016.    Review of Systems  Constitutional: Negative.   Respiratory:  Positive for shortness of breath. Negative for cough, chest tightness and wheezing.   Genitourinary: Negative.        Objective:    Physical Exam Constitutional:      Appearance: Normal appearance.  Cardiovascular:     Rate and Rhythm: Normal rate and regular rhythm.  Pulmonary:     Effort: Pulmonary effort is normal.     Breath sounds: Normal breath sounds.     BP 104/68 (BP Location: Right Arm, Patient Position: Sitting, Cuff Size: Large)   LMP 08/27/2016  Wt Readings from Last 3 Encounters:  06/08/22 204 lb (92.5 kg)  05/11/22 201 lb (91.2 kg)  05/08/22 201 lb (91.2 kg)        Patient informed chaperone available to be present for breast and/or pelvic exam. Patient has requested no chaperone to be present. Patient has been advised what will be completed during breast and pelvic exam.   Assessment & Plan:   Problem List Items Addressed This Visit   None Visit Diagnoses     Iron deficiency anemia, unspecified iron deficiency anemia type    -  Primary   Relevant Orders   Iron, TIBC and Ferritin Panel   CBC with Differential/Platelet   Shortness of breath        PMB (postmenopausal bleeding)          Vaginal ultrasound: Anteverted uterus, normal size and shape, no myometrial masses. Thin, symmetrical endometrium -2.13 mm.  No masses or thickening seen, avascular.   Both ovaries within normal limits. No adnexal masses, no free fluid.  Plan: Ultrasound reviewed, reassurance provided. If continues to have bleeding with intercourse we discussed option for vaginal estrogen. Will check iron panel, CBC today. Good BP today. Recommend following up with PCP soon. If she experiences chest pain/tightness with shortness of breath she should go to ER.      Olivia Mackie DNP, 4:31 PM 09/05/2022

## 2022-09-06 ENCOUNTER — Encounter: Payer: Self-pay | Admitting: Family Medicine

## 2022-09-06 LAB — IRON,TIBC AND FERRITIN PANEL
%SAT: 15 % (calc) — ABNORMAL LOW (ref 16–45)
Ferritin: 17 ng/mL (ref 16–232)
Iron: 58 ug/dL (ref 45–160)
TIBC: 397 mcg/dL (calc) (ref 250–450)

## 2022-09-07 ENCOUNTER — Ambulatory Visit (HOSPITAL_BASED_OUTPATIENT_CLINIC_OR_DEPARTMENT_OTHER)
Admission: RE | Admit: 2022-09-07 | Discharge: 2022-09-07 | Disposition: A | Discharge status: Home / Self Care | Payer: 59 | Source: Ambulatory Visit | Attending: Family Medicine | Admitting: Family Medicine

## 2022-09-07 ENCOUNTER — Ambulatory Visit: Payer: 59 | Admitting: Family Medicine

## 2022-09-07 ENCOUNTER — Encounter: Payer: Self-pay | Admitting: Family Medicine

## 2022-09-07 VITALS — BP 122/76 | HR 58 | Temp 98.8°F | Resp 18 | Ht 62.5 in | Wt 214.5 lb

## 2022-09-07 DIAGNOSIS — R0602 Shortness of breath: Secondary | ICD-10-CM | POA: Diagnosis present

## 2022-09-07 MED ORDER — ALBUTEROL SULFATE HFA 108 (90 BASE) MCG/ACT IN AERS: 2.0000 | INHALATION_SPRAY | Freq: Four times a day (QID) | RESPIRATORY_TRACT | 0 refills | Status: DC | PRN

## 2022-09-07 NOTE — Progress Notes (Signed)
   Subjective:    Patient ID: Jordan Vang, female    DOB: [birth year], 57 y.o.   MRN: [id number]  HPI SOB- 'i don't feel like I get a full breath'.  Sxs will come and go throughout the day.  Has hx of seasonal allergies but doesn't feel this is congestion related.  Sxs have been ongoing for 1 month.  No CP or tightness.  Can occur at rest and doesn't worsen w/ activity.  No wheezing.  Sxs don't seem to be related to anxiety.  No hx of asthma.  Currently on Allegra and Flonase, will use eye drops as needed.  No obvious pattern to sxs- has not noticed a relation to eating.  Denies any increased reflux sxs.  Has ongoing cough.   Review of Systems For ROS see HPI     Objective:   Physical Exam Vitals reviewed.  Constitutional:      General: She is not in acute distress.    Appearance: She is well-developed. She is obese. She is not ill-appearing.  HENT:     Head: Normocephalic and atraumatic.  Eyes:     Conjunctiva/sclera: Conjunctivae normal.     Pupils: Pupils are equal, round, and reactive to light.  Neck:     Thyroid: No thyromegaly.  Cardiovascular:     Rate and Rhythm: Normal rate and regular rhythm.     Heart sounds: Normal heart sounds. No murmur heard. Pulmonary:     Effort: Pulmonary effort is normal. No respiratory distress.     Breath sounds: Normal breath sounds.  Abdominal:     General: There is no distension.     Palpations: Abdomen is soft.     Tenderness: There is no abdominal tenderness.  Musculoskeletal:     Cervical back: Normal range of motion and neck supple.  Lymphadenopathy:     Cervical: No cervical adenopathy.  Skin:    General: Skin is warm and dry.  Neurological:     General: No focal deficit present.     Mental Status: She is alert and oriented to person, place, and time.  Psychiatric:        Mood and Affect: Mood normal.        Behavior: Behavior normal.           Assessment & Plan:  Shortness of breath- new.  Pt reports that for the  last month she feels like she is not able to get a full breath.  This occurs multiple times each day and independent of activity.  Denies chest tightness or pain.  For the last 2 weeks has had a dry cough.  Unclear if this is due to GERD, PND, or other cause.  Will get CXR to assess.  In the meantime, will start albuterol inhaler to see if this improves her air movement.  If yes, we can refer to pulmonary for PFTs and complete evaluation as pt has no hx of COPD or asthma.  Will make additional recommendations pending the CXR results.  Pt expressed understanding and is in agreement w/ plan.

## 2022-09-07 NOTE — Patient Instructions (Addendum)
Go to [facility] and get your xray We'll notify you of the results and determine next steps START the Albuterol- 2 puffs as needed for shortness of breath IF the albuterol improves your symptoms, let me know so we can determine if a daily inhaler is required Continue your daily allergy medication Call with any questions or concerns We'll figure this out!!!

## 2022-09-11 ENCOUNTER — Telehealth: Payer: Self-pay

## 2022-09-11 NOTE — Telephone Encounter (Signed)
Left Results on pt VM

## 2022-09-11 NOTE — Telephone Encounter (Signed)
-----   Message from Sheliah Hatch, MD sent at 09/11/2022  7:22 AM EDT ----- Normal chest xray- good news!  How are you feeling?

## 2022-09-18 ENCOUNTER — Other Ambulatory Visit: Payer: 59

## 2022-10-08 ENCOUNTER — Other Ambulatory Visit: Payer: Self-pay | Admitting: Family Medicine

## 2022-10-08 DIAGNOSIS — E039 Hypothyroidism, unspecified: Secondary | ICD-10-CM

## 2022-11-04 ENCOUNTER — Other Ambulatory Visit: Payer: Self-pay | Admitting: Orthopaedic Surgery

## 2022-11-05 ENCOUNTER — Encounter: Payer: Self-pay | Admitting: Orthopaedic Surgery

## 2022-11-05 ENCOUNTER — Other Ambulatory Visit: Payer: Self-pay | Admitting: Family Medicine

## 2022-11-05 ENCOUNTER — Other Ambulatory Visit (INDEPENDENT_AMBULATORY_CARE_PROVIDER_SITE_OTHER): Payer: 59

## 2022-11-05 ENCOUNTER — Ambulatory Visit (INDEPENDENT_AMBULATORY_CARE_PROVIDER_SITE_OTHER): Payer: 59 | Admitting: Orthopaedic Surgery

## 2022-11-05 DIAGNOSIS — Z96641 Presence of right artificial hip joint: Secondary | ICD-10-CM

## 2022-11-05 DIAGNOSIS — Z96642 Presence of left artificial hip joint: Secondary | ICD-10-CM

## 2022-11-05 DIAGNOSIS — Z1231 Encounter for screening mammogram for malignant neoplasm of breast: Secondary | ICD-10-CM

## 2022-11-05 NOTE — Progress Notes (Signed)
The patient is very well-known to me.  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  She says she is doing well.  She still has lateral hip numbness on the right side and has been dealing with some left knee issues.  She did see Dr. Shon Baton and she is considering PRP at some point.  She did have appropriate questions about meloxicam as well as glucosamine and we talked about turmeric for her knee as well.  She walks without a limp or assistive device.  Both hips move smoothly and fluidly and her leg lengths are near equal.  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  At this point follow-up for hips is as needed.  If she does develop any issues with her hip she knows to let us know and we can also see her for her knee and I encouraged her to reach out to Dr. Shon Baton as well for her knee.  All questions and concerns were addressed and answered.

## 2022-11-21 ENCOUNTER — Encounter (INDEPENDENT_AMBULATORY_CARE_PROVIDER_SITE_OTHER): Payer: Self-pay

## 2022-12-07 ENCOUNTER — Encounter: Payer: Self-pay | Admitting: Family Medicine

## 2022-12-07 ENCOUNTER — Encounter: Payer: 59 | Admitting: Family Medicine

## 2022-12-07 ENCOUNTER — Ambulatory Visit (INDEPENDENT_AMBULATORY_CARE_PROVIDER_SITE_OTHER): Payer: 59 | Admitting: Family Medicine

## 2022-12-07 VITALS — BP 118/78 | HR 65 | Temp 98.1°F | Resp 18 | Ht 62.5 in | Wt 219.2 lb

## 2022-12-07 DIAGNOSIS — Z0000 Encounter for general adult medical examination without abnormal findings: Secondary | ICD-10-CM | POA: Diagnosis not present

## 2022-12-07 DIAGNOSIS — E559 Vitamin D deficiency, unspecified: Secondary | ICD-10-CM

## 2022-12-07 LAB — BASIC METABOLIC PANEL
BUN: 18 mg/dL (ref 6–23)
CO2: 29 mEq/L (ref 19–32)
Calcium: 10 mg/dL (ref 8.4–10.5)
Chloride: 101 mEq/L (ref 96–112)
Creatinine, Ser: 0.78 mg/dL (ref 0.40–1.20)
GFR: 84.11 mL/min (ref >60.00)
Glucose, Bld: 96 mg/dL (ref 70–99)
Potassium: 4.5 mEq/L (ref 3.5–5.1)
Sodium: 136 mEq/L (ref 135–145)

## 2022-12-07 LAB — LIPID PANEL
Cholesterol: 138 mg/dL (ref 0–200)
HDL: 57.1 mg/dL (ref >39.00)
LDL Cholesterol: 60 mg/dL (ref 0–99)
NonHDL: 81.38
Total CHOL/HDL Ratio: 2
Triglycerides: 107 mg/dL (ref 0.0–149.0)
VLDL: 21.4 mg/dL (ref 0.0–40.0)

## 2022-12-07 LAB — CBC WITH DIFFERENTIAL/PLATELET
Basophils Absolute: 0.1 10*3/uL (ref 0.0–0.1)
Basophils Relative: 1 % (ref 0.0–3.0)
Eosinophils Absolute: 0.2 10*3/uL (ref 0.0–0.7)
Eosinophils Relative: 2.8 % (ref 0.0–5.0)
HCT: 41.3 % (ref 36.0–46.0)
Hemoglobin: 13.3 g/dL (ref 12.0–15.0)
Lymphocytes Relative: 32.1 % (ref 12.0–46.0)
Lymphs Abs: 2.3 10*3/uL (ref 0.7–4.0)
MCHC: 32.3 g/dL (ref 30.0–36.0)
MCV: 84.5 fl (ref 78.0–100.0)
Monocytes Absolute: 0.5 10*3/uL (ref 0.1–1.0)
Monocytes Relative: 7.4 % (ref 3.0–12.0)
Neutro Abs: 4.1 10*3/uL (ref 1.4–7.7)
Neutrophils Relative %: 56.7 % (ref 43.0–77.0)
Platelets: 234 10*3/uL (ref 150.0–400.0)
RBC: 4.89 Mil/uL (ref 3.87–5.11)
RDW: 14.2 % (ref 11.5–15.5)
WBC: 7.2 10*3/uL (ref 4.0–10.5)

## 2022-12-07 LAB — VITAMIN D 25 HYDROXY (VIT D DEFICIENCY, FRACTURES): VITD: 49.52 ng/mL (ref 30.00–100.00)

## 2022-12-07 LAB — HEPATIC FUNCTION PANEL
ALT: 24 U/L (ref 0–35)
AST: 20 U/L (ref 0–37)
Albumin: 4.6 g/dL (ref 3.5–5.2)
Alkaline Phosphatase: 61 U/L (ref 39–117)
Bilirubin, Direct: 0.1 mg/dL (ref 0.0–0.3)
Total Bilirubin: 0.5 mg/dL (ref 0.2–1.2)
Total Protein: 7.1 g/dL (ref 6.0–8.3)

## 2022-12-07 LAB — TSH: TSH: 3.59 u[IU]/mL (ref 0.35–5.50)

## 2022-12-07 NOTE — Assessment & Plan Note (Signed)
Check labs and replete prn. 

## 2022-12-07 NOTE — Assessment & Plan Note (Signed)
Ongoing issue for pt.  BMI 39.46 and when combined w/ hyperlipidemia and OSA, this qualifies as morbid obesity.  Encouraged low carb diet and regular exercise.  Check labs to risk stratify.  Will follow.

## 2022-12-07 NOTE — Patient Instructions (Addendum)
Follow up in 6 months to recheck cholesterol We'll notify you of your lab results and make any changes if needed Continue to work on healthy diet and regular exercise- you can do it! Call with any questions or concerns Stay Safe!  Stay Healthy! Enjoy your trip!!!

## 2022-12-07 NOTE — Assessment & Plan Note (Signed)
Pt's PE WNL w/ exception of BMI.  UTD on mammo, pap, colonoscopy, Tdap.  Check labs.  Anticipatory guidance provided.

## 2022-12-07 NOTE — Progress Notes (Signed)
   Subjective:    Patient ID: Jordan Vang, female    DOB: [birth year], 57 y.o.   MRN: [id number]  HPI CPE- UTD on mammo, pap, colonoscopy, Tdap.  Patient Care Team    Relationship Specialty Notifications Start End  Sheliah Hatch, MD PCP - General Family Medicine  05/30/21   Olivia Mackie, NP Nurse Practitioner Gynecology  05/30/21   Carilyn Goodpasture, PA-C Physician Assistant Sleep Medicine  05/30/21   Kathryne Hitch, MD Consulting Physician Orthopedic Surgery  05/30/21   Janet Berlin, MD Consulting Physician Ophthalmology  05/30/21     Health Maintenance  Topic Date Due   INFLUENZA VACCINE  11/22/2022   DTaP/Tdap/Td (2 - Td or Tdap) 07/29/2023   PAP SMEAR-Modifier  10/31/2023   MAMMOGRAM  12/13/2023   Colonoscopy  10/19/2025   Zoster Vaccines- Shingrix  Completed   HPV VACCINES  Aged Out   COVID-19 Vaccine  Discontinued   Hepatitis C Screening  Discontinued   HIV Screening  Discontinued      Review of Systems Patient reports no vision/ hearing changes, adenopathy,fever, weight change,  persistant/recurrent hoarseness , swallowing issues, chest pain, palpitations, edema, persistant/recurrent cough, hemoptysis, dyspnea (rest/exertional/paroxysmal nocturnal), gastrointestinal bleeding (melena, rectal bleeding), abdominal pain, significant heartburn, bowel changes, GU symptoms (dysuria, hematuria, incontinence), Gyn symptoms (abnormal  bleeding, pain),  syncope, focal weakness, memory loss, numbness & tingling, skin/hair/nail changes, abnormal bruising or bleeding, anxiety, or depression.     Objective:   Physical Exam General Appearance:    Alert, cooperative, no distress, appears stated age, obese  Head:    Normocephalic, without obvious abnormality, atraumatic  Eyes:    PERRL, conjunctiva/corneas clear, EOM's intact both eyes  Ears:    Normal TM's and external ear canals, both ears  Nose:   Nares normal, septum midline, mucosa normal, no drainage    or sinus  tenderness  Throat:   Lips, mucosa, and tongue normal; teeth and gums normal  Neck:   Supple, symmetrical, trachea midline, no adenopathy;    Thyroid: no enlargement/tenderness/nodules  Back:     Symmetric, no curvature, ROM normal, no CVA tenderness  Lungs:     Clear to auscultation bilaterally, respirations unlabored  Chest Wall:    No tenderness or deformity   Heart:    Regular rate and rhythm, S1 and S2 normal, no murmur, rub   or gallop  Breast Exam:    Deferred to GYN  Abdomen:     Soft, non-tender, bowel sounds active all four quadrants,    no masses, no organomegaly  Genitalia:    Deferred to GYN  Rectal:    Extremities:   Extremities normal, atraumatic, no cyanosis or edema  Pulses:   2+ and symmetric all extremities  Skin:   Skin color, texture, turgor normal, no rashes or lesions  Lymph nodes:   Cervical, supraclavicular, and axillary nodes normal  Neurologic:   CNII-XII intact, normal strength, sensation and reflexes    throughout          Assessment & Plan:

## 2022-12-10 ENCOUNTER — Telehealth: Payer: Self-pay

## 2022-12-10 ENCOUNTER — Other Ambulatory Visit: Payer: Self-pay | Admitting: Family Medicine

## 2022-12-10 NOTE — Telephone Encounter (Signed)
-----   Message from Neena Rhymes sent at 12/10/2022  7:36 AM EDT ----- Labs look great!  No changes at this time

## 2022-12-10 NOTE — Telephone Encounter (Signed)
Left results on pt VM  

## 2022-12-19 ENCOUNTER — Ambulatory Visit
Admission: RE | Admit: 2022-12-19 | Discharge: 2022-12-19 | Disposition: A | Discharge status: Home / Self Care | Payer: 59 | Source: Ambulatory Visit | Attending: Family Medicine | Admitting: Family Medicine

## 2022-12-19 DIAGNOSIS — Z1231 Encounter for screening mammogram for malignant neoplasm of breast: Secondary | ICD-10-CM

## 2023-01-08 ENCOUNTER — Encounter: Payer: Self-pay | Admitting: Nurse Practitioner

## 2023-01-08 ENCOUNTER — Ambulatory Visit (INDEPENDENT_AMBULATORY_CARE_PROVIDER_SITE_OTHER): Payer: 59 | Admitting: Nurse Practitioner

## 2023-01-08 VITALS — BP 112/68 | HR 74 | Ht 62.5 in | Wt 221.6 lb

## 2023-01-08 DIAGNOSIS — Z780 Asymptomatic menopausal state: Secondary | ICD-10-CM

## 2023-01-08 DIAGNOSIS — Z01419 Encounter for gynecological examination (general) (routine) without abnormal findings: Secondary | ICD-10-CM | POA: Diagnosis not present

## 2023-01-08 NOTE — Progress Notes (Signed)
Jordan Vang 1965-11-27 [id number]   History:  57 y.o. G1P1001 presents for annual exam. Postmenopausal - no HRT,. Had episodes of light vaginal bleeding earlier this year after intercourse. Negative U/S. No bleeding since.  Normal pap and mammogram history. History of basal cell carcinoma, hypothyroidism, HLD, PMDD.  Gynecologic History Patient's last menstrual period was 08/27/2016.   Contraception/Family planning: post menopausal status Sexually active: Yes  Health Maintenance Last Pap: 10/31/2018. Results were: Normal neg HPV, 5-year repeat Last mammogram: 12/19/2022. Results were: Normal Last colonoscopy: 10/20/2015. Results were: Normal, 10-year recall Last Dexa: 01/30/2022. Results were: Normal  Past medical history, past surgical history, family history and social history were all reviewed and documented in the EPIC chart. 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 89 yo son, working as Agricultural consultant in [employer]. Mother diagnosed with breast cancer at age 11, osteoporosis.   ROS:  A ROS was performed and pertinent positives and negatives are included.  Exam:  Vitals:   01/08/23 1025  BP: 112/68  Pulse: 74  SpO2: 100%  Weight: 221 lb 9.6 oz (100.5 kg)  Height: 5' 2.5" (1.588 m)     Body mass index is 39.89 kg/m.  General appearance:  Normal Thyroid:  Symmetrical, normal in size, without palpable masses or nodularity. Respiratory  Auscultation:  Clear without wheezing or rhonchi Cardiovascular  Auscultation:  Regular rate, without rubs, murmurs or gallops  Edema/varicosities:  Not grossly evident Abdominal  Soft,nontender, without masses, guarding or rebound.  Liver/spleen:  No organomegaly noted  Hernia:  None appreciated  Skin  Inspection:  Grossly normal Breasts: Examined lying and sitting.   Right: Without masses, retractions, nipple discharge or axillary adenopathy.   Left: Without masses, retractions, nipple discharge or axillary adenopathy. Pelvic: External  genitalia:  no lesions              Urethra:  normal appearing urethra with no masses, tenderness or lesions              Bartholins and Skenes: normal                 Vagina: normal appearing vagina with normal color and discharge, no lesions              Cervix: no lesions Bimanual Exam:  Uterus:  no masses or tenderness              Adnexa: no mass, fullness, tenderness              Rectovaginal: Deferred              Anus:  normal sphincter tone, no lesions  Patient informed chaperone available to be present for breast and pelvic exam. Patient has requested no chaperone to be present. Patient has been advised what will be completed during breast and pelvic exam.   Assessment/Plan:  57 y.o. G1P1001 for annual exam.   Well female exam with routine gynecological exam - Education provided on SBEs, importance of preventative screenings, current guidelines, high calcium diet, regular exercise, and multivitamin daily. Labs with PCP.  Postmenopausal - No HRT, no bleeding  Screening for cervical cancer - Normal Pap history.  Will repeat at 5-year interval per guidelines.  Screening for breast cancer - Normal mammogram history.  Continue annual screenings.  Normal breast exam today.  Screening for colon cancer - 2017 colonoscopy. Will repeat at GI's recommended interval.   Screening for osteoporosis - Normal bone density 01/2022. Will repeat DXA in 5 years. Mother with  history of osteoporosis, but she was a smoker.   Return in 1 year for annual.     Olivia Mackie DNP, 10:45 AM 01/08/2023

## 2023-02-19 ENCOUNTER — Other Ambulatory Visit: Payer: Self-pay | Admitting: Family Medicine

## 2023-02-19 DIAGNOSIS — E039 Hypothyroidism, unspecified: Secondary | ICD-10-CM

## 2023-02-24 ENCOUNTER — Other Ambulatory Visit: Payer: Self-pay | Admitting: Orthopaedic Surgery

## 2023-03-06 ENCOUNTER — Other Ambulatory Visit: Payer: Self-pay

## 2023-03-06 ENCOUNTER — Ambulatory Visit: Payer: 59 | Admitting: Sports Medicine

## 2023-03-06 ENCOUNTER — Encounter: Payer: Self-pay | Admitting: Sports Medicine

## 2023-03-06 DIAGNOSIS — M25562 Pain in left knee: Secondary | ICD-10-CM

## 2023-03-06 DIAGNOSIS — G8929 Other chronic pain: Secondary | ICD-10-CM

## 2023-03-06 DIAGNOSIS — M1712 Unilateral primary osteoarthritis, left knee: Secondary | ICD-10-CM | POA: Diagnosis not present

## 2023-03-06 DIAGNOSIS — M6740 Ganglion, unspecified site: Secondary | ICD-10-CM | POA: Diagnosis not present

## 2023-03-06 NOTE — Progress Notes (Signed)
Patient says that she has been in physical therapy for her left knee. She says that it feels full, mostly in the back but also throughout the entire knee. She says that her range of motion (flexion) is limited. Patient says that her physical therapist examined it and suggested she be evaluated for a possible Baker's cyst. She says that she has had conversations previously about possible PRP, and depending on her appointment today she may be interested in further discussion about this PRP injection.

## 2023-03-06 NOTE — Progress Notes (Signed)
Jordan Vang - 57 y.o. female MRN [id number]  Date of birth: [birth year]  Office Visit Note: Visit Date: 03/06/2023 PCP: Sheliah Hatch, MD Referred by: Sheliah Hatch, MD  Subjective: Chief Complaint  Patient presents with   Left Knee - Pain   HPI: Jordan Vang is a very pleasant 57 y.o. female who presents today for evaluation of acute on chronic left knee pain with stiffness and posterior swelling.   I have seen her once before for the knee, she was considering SKOAP clinical trial. She has had several treatments including CSI, hyaluronic acid injections in December. Most recently these were not very helpful.  Her knees had been doing OT today for a time, but she is currently involved in formalized physical therapy and her knee flexion has been significantly limited and causing her pain.  Her physical therapist thought she should be evaluated for a possible Baker's cyst.  She does have some pains of the right knee as well but the left is more significant and range of motion more restricted.  She does take meloxicam 15 mg once daily for pain control.  Pertinent ROS were reviewed with the patient and found to be negative unless otherwise specified above in HPI.   Assessment & Plan: Visit Diagnoses:  1. Unilateral primary osteoarthritis, left knee   2. Chronic pain of left knee   3. Ganglion cyst    Plan: Impression is acute on chronic left knee pain with progressive advanced knee osteoarthritis.  We did ultrasound the knee which showed a small effusion as well as a small likely ganglion cyst in the posterior knee capsule, but I do not believe this is limiting her range of motion at all.  No Baker's cyst was found.  Discussed with Jordan Vang that her knee ROM is significantly limited and I believe this is due to her advanced arthritis. Her MRI from 1 year ago showed areas of high-grade cartilage loss tricompartmentally in the knee.  Recent CSI and hyaluronic acid injections are no  longer being as effective.  She is interested in PRP injection therapy, we did talk about this briefly today and I gave her my PRP handout for her to read through.  She knows she would need to stop all anti-inflammatories for 10 days prior to 2 weeks after injection.  If she wishes to proceed with this, she will discontinue her meloxicam, until this time she may continue 15 mg once daily for pain as needed.   Follow-up: Return for Will notify me if wishes to proceed with Knee PRP injection.   Meds & Orders: No orders of the defined types were placed in this encounter.   Orders Placed This Encounter  Procedures   [place] Extrem Low Left Ltd    Procedures: No procedures performed      Clinical History: No specialty comments available.  She reports that she has never smoked. She has never used smokeless tobacco. No results for input(s): "HGBA1C", "LABURIC" in the last 8760 hours.  Objective:   Vital Signs: LMP 08/27/2016   Physical Exam  Gen: Well-appearing, in no acute distress; non-toxic CV: Well-perfused. Warm.  Resp: Breathing unlabored on room air; no wheezing. Psych: Fluid speech in conversation; appropriate affect; normal thought process Neuro: Sensation intact throughout. No gross coordination deficits.   Ortho Exam - Left knee: There is a trace effusion of the knee.  Range of motion is significantly limited from about 5-95 degrees compared to 0-125 degrees of the contralateral knee.  There is no varus or valgus instability.  No palpable Baker's cyst.  Imaging: [place] Extrem Low Left Ltd  Result Date: 03/06/2023 Limited musculoskeletal ultrasound of the left knee was performed today.  Evaluation of the anterior knee shows a small joint effusion in the suprapatellar pouch.  There is spurring on both ends and the undersurface of the patella.  Overlying quadricep tendon intact.  Evaluation of the posterior capsule shows no Baker's cyst.  There is a mixed echogenic structure that is  approximately 1.25 x 0.9 cm in nature, likely favoring a ganglion cyst given the location.    Narrative & Impression  CLINICAL DATA:  Chronic left knee pain and weakness. Decreased mobility.   EXAM: MRI OF THE LEFT KNEE WITHOUT CONTRAST   TECHNIQUE: Multiplanar, multisequence MR imaging of the knee was performed. No intravenous contrast was administered.   COMPARISON:  None Available.   FINDINGS: MENISCI   Medial: Large radial tear of the posterior horn of the medial meniscus with peripheral meniscal extrusion. Degeneration of the posterior horn of the medial meniscus.   Lateral: Intact.   LIGAMENTS   Cruciates: Intact ACL and PCL. Severe expansion of the ACL and increased signal as can be seen with mucinous degeneration.   Collaterals: Medial collateral ligament is intact. Lateral collateral ligament complex is intact.   CARTILAGE   Patellofemoral: Partial-thickness cartilage loss of the patellofemoral compartment with areas of full-thickness cartilage loss. Subchondral reactive marrow changes in the lateral patellofemoral compartment. Small marginal osteophytes.   Medial: Partial-thickness cartilage loss of the medial femorotibial compartment with areas of high-grade partial-thickness cartilage loss of the weight-bearing surface of the medial femoral condyle. Small marginal osteophytes.   Lateral: Partial thickness cartilage loss of the lateral femorotibial compartment with areas of full-thickness cartilage fissuring along the lateral tibial plateau. Small marginal osteophytes.   JOINT: No joint effusion. Normal Hoffa's fat-pad. No plical thickening. 14 mm ganglion cyst along the posterior joint capsule.   POPLITEAL FOSSA: Popliteus tendon is intact. No Baker's cyst.   EXTENSOR MECHANISM: Intact quadriceps tendon. Intact patellar tendon. Intact lateral patellar retinaculum. Intact medial patellar retinaculum. Intact MPFL.   BONES: No aggressive osseous  lesion. No fracture or dislocation.   Other: No fluid collection or hematoma. Muscles are normal.   IMPRESSION: 1. Large radial tear of the posterior horn of the medial meniscus with peripheral meniscal extrusion. 2. Tricompartmental cartilage abnormalities as described above.     Electronically Signed   By: Elige Ko M.D.   On: 02/01/2022 08:57    Narrative & Impression  CLINICAL DATA:  Bilateral knee pain   EXAM: LEFT KNEE 3 VIEWS   COMPARISON:  None Available.   FINDINGS: Frontal, lateral, and sunrise views of the left knee are obtained. No acute fracture, subluxation, or dislocation. There is moderate 3 compartmental osteoarthritis greatest in the patellofemoral compartment. No effusion. Soft tissues are unremarkable.   IMPRESSION: 1. Moderate 3 compartmental osteoarthritis. No acute bony abnormality.     Electronically Signed   By: Sharlet Salina M.D.   On: 12/08/2021 10:08    Past Medical/Family/Surgical/Social History: Medications & Allergies reviewed per EMR, new medications updated. Patient Active Problem List   Diagnosis Date Noted   Vitamin D deficiency 12/07/2022   Status post total replacement of right hip 05/24/2022   Chronic pain of both knees 12/05/2021   Right hip pain 12/05/2021   Physical exam 11/28/2021   Acquired hypothyroidism 05/30/2021   Adverse effect of antihyperlipidemic and antiarteriosclerotic drugs, initial encounter  05/30/2021   Allergic rhinitis 05/30/2021   Chronic pain syndrome 05/30/2021   Dyslipidemia 05/30/2021   Myalgia 05/30/2021   Primary osteoarthritis 05/30/2021   Recurrent major depression in remission (HCC) 05/30/2021   Solitary pulmonary nodule 05/30/2021   Sleep pattern disturbance 05/30/2021   OSA (obstructive sleep apnea) 05/30/2021   Status post left hip replacement 08/02/2020   B12 deficiency 11/22/2015   Fatigue 08/16/2015   Basal cell carcinoma, lip 10/28/2013   Morbid obesity (HCC) 10/28/2013    Past Medical History:  Diagnosis Date   Allergy    Anemia    Arthritis    Cancer (HCC)    basal cell cancers, MOH's on lip, several on back   Elevated cholesterol    Hypothyroidism    PMDD (premenstrual dysphoric disorder)    Dr Audie Box   Sleep apnea    CPAP   Thyroid disease    Family History  Problem Relation Age of Onset   Hearing loss Mother    Depression Mother    Cancer Mother    Breast cancer Mother        Age 82   Lung cancer Mother        smoker   Osteoporosis Mother    Arthritis Father    Hyperlipidemia Father    Stroke Father 50   Hyperlipidemia Sister    Hyperlipidemia Brother    Heart attack Maternal Grandfather        < 55 ?   Diabetes Neg Hx    Colon cancer Neg Hx    Past Surgical History:  Procedure Laterality Date   BASAL CELL CARCINOMA EXCISION     Mohs surgery on Lip   KNEE SURGERY Right 04/23/2013   arthroscopy   TONSILLECTOMY     TOTAL HIP ARTHROPLASTY Left 08/02/2020   Procedure: LEFT TOTAL HIP ARTHROPLASTY ANTERIOR APPROACH;  Surgeon: Kathryne Hitch, MD;  Location: [facility] OR;  Service: Orthopedics;  Laterality: Left;   TOTAL HIP ARTHROPLASTY Right 05/11/2022   Procedure: RIGHT TOTAL HIP ARTHROPLASTY ANTERIOR APPROACH;  Surgeon: Kathryne Hitch, MD;  Location: [facility];  Service: Orthopedics;  Laterality: Right;   Social History   Occupational History   Not on file  Tobacco Use   Smoking status: Never   Smokeless tobacco: Never  Vaping Use   Vaping status: Never Used  Substance and Sexual Activity   Alcohol use: Yes    Alcohol/week: 0.0 standard drinks of alcohol    Comment: socially   Drug use: No   Sexual activity: Yes    Birth control/protection: Post-menopausal    Comment: intercourse age 34, sexual partners less than 5

## 2023-03-08 ENCOUNTER — Ambulatory Visit: Payer: 59 | Admitting: Sports Medicine

## 2023-03-18 ENCOUNTER — Encounter: Payer: Self-pay | Admitting: Sports Medicine

## 2023-05-09 ENCOUNTER — Other Ambulatory Visit: Payer: Self-pay

## 2023-05-09 ENCOUNTER — Encounter: Payer: Self-pay | Admitting: Sports Medicine

## 2023-05-09 ENCOUNTER — Ambulatory Visit: Payer: 59 | Admitting: Sports Medicine

## 2023-05-09 DIAGNOSIS — M25562 Pain in left knee: Secondary | ICD-10-CM

## 2023-05-09 DIAGNOSIS — M1712 Unilateral primary osteoarthritis, left knee: Secondary | ICD-10-CM

## 2023-05-09 DIAGNOSIS — G8929 Other chronic pain: Secondary | ICD-10-CM

## 2023-05-09 NOTE — Progress Notes (Signed)
   Procedure Note  Patient: Jordan Vang             Date of Birth: Nov 21, 1965           MRN: [id number]             Visit Date: 05/09/2023  Procedures: Visit Diagnoses:  1. Chronic pain of left knee   2. Unilateral primary osteoarthritis, left knee    Ultrasound-guided PRP Knee Injection, Left: After discussion on risks/benefits/indications, informed verbal consent was obtained and a timeout was performed. The patient was lying supine on exam table with knee bolster underneath affected knee for [place]. The patient's right knee was prepped with Chloraprep and alcohol swabs. Utilizing ultrasound-guidance with the probe in a transverse position, the patient's suprapatellar bursa was identified and the surrounding soft tissue area was anesthesized first with a 25G, 1.5" needle with approximately 4 cc of lidocaine 1% using sterile technique, but none was delivered into the knee joint or bursa itself. Following this, using ultrasound guidance via an in-plane approach, a 22G, 1.5" needle was directed into the suprapatellar pouch of the knee joint and subsequently injected intraarticularly with 5.75 cc of platelet-rich-plasma (leukocyte-poor). Visualization of injectate spread within the knee joint was visualized dynamically under ultrasound guidance. Patient tolerated the procedure well without immediate complications.     Kit: RegenLab: RegenPlasma; BCT-3 kit   *Post-PRP Injection Guidelines: No anti-inflammatories (ibuprofen/motrin, aleve, meloxicam, etc.) for 2 weeks.  No ice for 2 weeks.  Short prescription of tramadol may be written if pain is severe, call if needed. Appropriate rest / bracing / and timeframe for beginning therapy discussed and patient endorsed understanding.   Madelyn Brunner, DO Primary Care Sports Medicine Physician  [facility] - Orthopedics  This note was dictated using Dragon naturally speaking software and may contain errors in syntax, spelling, or content  which have not been identified prior to signing this note.

## 2023-05-20 ENCOUNTER — Encounter: Payer: Self-pay | Admitting: Family Medicine

## 2023-05-21 ENCOUNTER — Encounter: Payer: Self-pay | Admitting: Physical Therapy

## 2023-05-21 ENCOUNTER — Ambulatory Visit: Payer: Self-pay | Admitting: Family Medicine

## 2023-05-21 ENCOUNTER — Ambulatory Visit (INDEPENDENT_AMBULATORY_CARE_PROVIDER_SITE_OTHER): Payer: 59 | Admitting: Physical Therapy

## 2023-05-21 DIAGNOSIS — R600 Localized edema: Secondary | ICD-10-CM

## 2023-05-21 DIAGNOSIS — R262 Difficulty in walking, not elsewhere classified: Secondary | ICD-10-CM

## 2023-05-21 DIAGNOSIS — M6281 Muscle weakness (generalized): Secondary | ICD-10-CM

## 2023-05-21 DIAGNOSIS — G8929 Other chronic pain: Secondary | ICD-10-CM | POA: Diagnosis not present

## 2023-05-21 DIAGNOSIS — M25562 Pain in left knee: Secondary | ICD-10-CM

## 2023-05-21 NOTE — Telephone Encounter (Signed)
  Chief Complaint: Fatigue/Pain Symptoms: generalized pain and fatigue Frequency: going on shortly after stopping meloxicam in early Jan in preparation for an injection in her knee Pertinent Negatives: Patient denies fever, CP Disposition: [] ED /[] Urgent Care (no appt availability in office) / [x] Appointment(In office/virtual)/ []  [facility]/ [] Home Care/ [] Refused Recommended Disposition /[] [facility]/ []  Follow-up with PCP Additional Notes: patient calling with concerns for generalized fatigue and pain. Patient states that symptoms started when she stopped her meloxicam  in preparation for RPR injection. Patient states that the fatigue is between mild and moderate. Pain has continued but patient states she is trying to give her injection time to work so that she doesn't have to go back on her pain medication. Per protocol, the recommendation would be an appointment. Patient had already spoken with her provider over MyChart and MD stated for her to get an appointment. Appointment made for 05/22/2023 at 11:20 am with her PCP. Patient verbalized understanding with the plan and all questions answered.   Copied from CRM [id number]. Topic: Clinical - Red Word Triage >> May 21, 2023  4:52 PM Louie Boston wrote: Red Word that prompted transfer to Nurse Triage: Extreme Fatigue/Pain Reason for Disposition  [1] MODERATE weakness (i.e., interferes with work, school, normal activities) AND [2] persists > 3 days  Answer Assessment - Initial Assessment Questions 1. DESCRIPTION: "Describe how you are feeling."     Stopped taking meloxicam in preparation for a PRP injection on 05/09/2023. Patient states the medication has been helping  2. SEVERITY: "How bad is it?"  "Can you stand and walk?"   - MILD (0-3): Feels weak or tired, but does not interfere with work, school or normal activities.   - MODERATE (4-7): Able to stand and walk; weakness interferes with work, school, or normal activities.   -  SEVERE (8-10): Unable to stand or walk; unable to do usual activities.     Between mild and moderate 3. ONSET: "When did these symptoms begin?" (e.g., hours, days, weeks, months)     Started shortly after stopping the meloxicam. 4. CAUSE: "What do you think is causing the weakness or fatigue?" (e.g., not drinking enough fluids, medical problem, trouble sleeping)     medication 5. NEW MEDICINES:  "Have you started on any new medicines recently?" (e.g., opioid pain medicines, benzodiazepines, muscle relaxants, antidepressants, antihistamines, neuroleptics, beta blockers)     No new medications 6. OTHER SYMPTOMS: "Do you have any other symptoms?" (e.g., chest pain, fever, cough, SOB, vomiting, diarrhea, bleeding, other areas of pain) Generalized pain  Protocols used: Weakness (Generalized) and Fatigue-A-AH

## 2023-05-21 NOTE — Therapy (Signed)
OUTPATIENT PHYSICAL THERAPY LOWER EXTREMITY EVALUATION   Patient Name: Jordan Vang MRN: [id number] DOB:Feb 09, 1966, 58 y.o., female Today's Date: 05/21/2023  END OF SESSION:  PT End of Session - 05/21/23 1404     Visit Number 1    Number of Visits 20    Date for PT Re-Evaluation 07/30/23    Authorization Type [employer]    PT Start Time 1355    PT Stop Time 1433    PT Time Calculation (min) 38 min    Activity Tolerance Patient tolerated treatment well    Behavior During Therapy WFL for tasks assessed/performed             Past Medical History:  Diagnosis Date   Allergy    Anemia    Arthritis    Cancer (HCC)    basal cell cancers, MOH's on lip, several on back   Elevated cholesterol    Hypothyroidism    PMDD (premenstrual dysphoric disorder)    Dr Audie Box   Sleep apnea    CPAP   Thyroid disease    Past Surgical History:  Procedure Laterality Date   BASAL CELL CARCINOMA EXCISION     Mohs surgery on Lip   KNEE SURGERY Right 04/23/2013   arthroscopy   TONSILLECTOMY     TOTAL HIP ARTHROPLASTY Left 08/02/2020   Procedure: LEFT TOTAL HIP ARTHROPLASTY ANTERIOR APPROACH;  Surgeon: Kathryne Hitch, MD;  Location: [facility];  Service: Orthopedics;  Laterality: Left;   TOTAL HIP ARTHROPLASTY Right 05/11/2022   Procedure: RIGHT TOTAL HIP ARTHROPLASTY ANTERIOR APPROACH;  Surgeon: Kathryne Hitch, MD;  Location: [facility];  Service: Orthopedics;  Laterality: Right;   Patient Active Problem List   Diagnosis Date Noted   Vitamin D deficiency 12/07/2022   Status post total replacement of right hip 05/24/2022   Chronic pain of both knees 12/05/2021   Right hip pain 12/05/2021   Physical exam 11/28/2021   Acquired hypothyroidism 05/30/2021   Adverse effect of antihyperlipidemic and antiarteriosclerotic drugs, initial encounter 05/30/2021   Allergic rhinitis 05/30/2021   Chronic pain syndrome 05/30/2021   Dyslipidemia 05/30/2021   Myalgia 05/30/2021   Primary  osteoarthritis 05/30/2021   Recurrent major depression in remission (HCC) 05/30/2021   Solitary pulmonary nodule 05/30/2021   Sleep pattern disturbance 05/30/2021   OSA (obstructive sleep apnea) 05/30/2021   Status post left hip replacement 08/02/2020   B12 deficiency 11/22/2015   Fatigue 08/16/2015   Basal cell carcinoma, lip 10/28/2013   Morbid obesity (HCC) 10/28/2013    PCP: Sheliah Hatch, MD   REFERRING PROVIDER: Madelyn Brunner, DO   REFERRING DIAG:  Diagnosis  [phone] (ICD-10-CM) - Chronic pain of left knee  M17.12 (ICD-10-CM) - Unilateral primary osteoarthritis, left knee       THERAPY DIAG:  Chronic pain of left knee  Muscle weakness (generalized)  Difficulty in walking, not elsewhere classified  Rationale for Evaluation and Treatment: Rehabilitation  ONSET DATE: over a year  SUBJECTIVE:   SUBJECTIVE STATEMENT: Pt stating her pain has worsened since stopping Meloxicam due to her PRP injection. Pt reported she  was considering SKOAP clinical trial. She has had several treatments including CSI, hyaluronic acid injections in December, but stated they didn't seem to help.   PERTINENT HISTORY: Knee PRP injection on 05/09/23 Bilateral hip replacements left 2022, Rt 2024, anemia, arthritis, cancer , thyroid disease, sleep apnea ,knee surgery 2015   PAIN:  NPRS scale: 0/10 at rest, worse pain 6-7/10 Pain location: left knee joint, distal  patella  Pain description: achy, radiation Aggravating factors: standing, walking Relieving factors: resting  PRECAUTIONS: None   WEIGHT BEARING RESTRICTIONS: No  FALLS:  Has patient fallen in last 6 months? No  LIVING ENVIRONMENT: Lives with: lives with their family and lives with their spouse Lives in: House/apartment Stairs: Yes: External: 5 steps; on right going up Has following equipment at home: None  OCCUPATION: no  PLOF: Independent  PATIENT GOALS: walk better without pain  Next MD  visit:   OBJECTIVE:   DIAGNOSTIC FINDINGS:  Result Date: 03/06/2023 Limited musculoskeletal ultrasound of the left knee was performed today.  Evaluation of the anterior knee shows a small joint effusion in the suprapatellar pouch.  There is spurring on both ends and the undersurface of the patella.  Overlying quadricep tendon intact.  Evaluation of the posterior capsule shows no Baker's cyst.  There is a mixed echogenic structure that is approximately 1.25 x 0.9 cm in nature, likely favoring a ganglion cyst given the location.   PATIENT SURVEYS:  05/21/23; FOTO intake:  43%  COGNITION: Overall cognitive status: WFL    SENSATION: Pt reporting numbness down Rt lateral thigh since her hip replacement  EDEMA:  Circumferential: left 44.5 centimeters, Rt 45 centimeters   PALPATION: No pin point tenderness  LOWER EXTREMITY ROM:   ROM Right 05/21/23 Left 05/21/23  Hip flexion    Hip extension    Hip abduction    Hip adduction    Hip internal rotation    Hip external rotation    Knee flexion 115 108  Knee extension 0 -10  Ankle dorsiflexion    Ankle plantarflexion    Ankle inversion    Ankle eversion     (Blank rows = not tested)  LOWER EXTREMITY MMT:  MMT Right 05/21/23 Left 05/21/23  Hip flexion    Hip extension    Hip abduction    Hip adduction    Hip internal rotation    Hip external rotation    Knee flexion    Knee extension    Ankle dorsiflexion    Ankle plantarflexion    Ankle inversion    Ankle eversion     (Blank rows = not tested)    FUNCTIONAL TESTS:  5 time sit to stand: 12.3 seconds c UE support  GAIT: Distance walked: clinic distances Assistive device utilized: None Level of assistance: Complete Independence Comments: mild antalgic gait with decreased left knee extension.                                                                                                                                                                          TODAY'S TREATMENT  DATE: 05/21/23 Therex:    HEP instruction/performance c cues for techniques, handout provided.  Trial set performed of each for comprehension and symptom assessment.  See below for exercise list  PATIENT EDUCATION:  Education details: HEP, POC Person educated: Patient Education method: Explanation, Demonstration, Verbal cues, and Handouts Education comprehension: verbalized understanding, returned demonstration, and verbal cues required  HOME EXERCISE PROGRAM: Access Code: [email or web address] URL: [email or web address] Date: 05/21/2023 Prepared by: Narda Amber  Exercises - Supine Bridge  - 2 x daily - 7 x weekly - 2 sets - 10 reps - 5 seconds hold - Seated Hamstring Stretch  - 2 x daily - 7 x weekly - 3-5 reps - 30 seconds hold - Supine Hamstring Stretch with Strap  - 2 x daily - 7 x weekly - 3-5 reps - 30 seconds hold - Seated Small Alternating Straight Leg Lifts with Heel Touch  - 2 x daily - 7 x weekly - 2 sets - 10 reps - Sit to Stand  - 2 x daily - 7 x weekly - 2 sets - 10 reps  ASSESSMENT:  CLINICAL IMPRESSION: Patient is a 58 y.o. who comes to clinic with complaints of left knee pain with mobility, strength and movement coordination deficits that impair their ability to perform usual daily and recreational functional activities without increase difficulty/symptoms at this time.  Patient to benefit from skilled PT services to address impairments and limitations to improve to previous level of function without restriction secondary to condition.   OBJECTIVE IMPAIRMENTS: decreased mobility, difficulty walking, decreased ROM, decreased strength, and pain.   ACTIVITY LIMITATIONS: bending, sitting, standing, squatting, sleeping, stairs, and transfers  PARTICIPATION LIMITATIONS: cleaning, laundry, driving, and community activity  PERSONAL FACTORS: 3+ comorbidities: see PMH above   are also affecting patient's functional outcome.   REHAB POTENTIAL: Good  CLINICAL DECISION MAKING: Stable/uncomplicated  EVALUATION COMPLEXITY: Low   GOALS: Goals reviewed with patient? Yes  SHORT TERM GOALS: (target date for Short term goals are 3 weeks 06/11/2023)   1.  Patient will demonstrate independent use of home exercise program to maintain progress from in clinic treatments.  Goal status: New  2. Pt will improve her 5 time sit to stand to </= 10 seconds c/s UE support   Goal Status: New  LONG TERM GOALS: (target dates for all long term goals are 10 weeks  07/30/2023 )   1. Patient will demonstrate/report pain at worst less than or equal to 2/10 to facilitate minimal limitation in daily activity secondary to pain symptoms.  Goal status: New   2. Patient will demonstrate independent use of home exercise program to facilitate ability to maintain/progress functional gains from skilled physical therapy services.  Goal status: New   3. Patient will demonstrate FOTO outcome > or = 58 % to indicate reduced disability due to condition.  Goal status: New   4.  Patient will demonstrate >/= 4+ /5 MMT of left LE throughout to faciltiate usual transfers, stairs, squatting at [facility] for daily life.   Goal status: New   5.  Patient will demonstrate ability to navigate a flight of stairs with reciprocal gait pattern with single hand rail.  Goal status: New   6.  Pt will improve her left knee flexion to >/= 115 degrees for improved funcitonal mobility.  Goal status: New     PLAN:  PT FREQUENCY: 1-2x/week  PT DURATION: 10 weeks  PLANNED INTERVENTIONS: Can include 54098- PT Re-evaluation, 97110-Therapeutic exercises, 97530- Therapeutic activity, [id number]- Neuromuscular re-education, 97535- Self  Care, 97140- Manual therapy, [phone]- Gait training, [phone]- Orthotic Fit/training, [phone]- Canalith repositioning, [id number]- Aquatic Therapy, 97014- Electrical stimulation (unattended), [id number]  Electrical stimulation (manual), [id number] Physical performance testing, 97016- Vasopneumatic device, [id number]- Ultrasound, [id number]- Traction (mechanical), [id number]- Ionotophoresis 4mg /ml Dexamethasone, Patient/Family education, Balance training, Stair training, Taping, Dry Needling, Joint mobilization, Joint manipulation, Spinal manipulation, Spinal mobilization, Scar mobilization, Vestibular training, Visual/preceptual remediation/compensation, DME instructions, Cryotherapy, and Moist heat.  All performed as medically necessary.  All included unless contraindicated  PLAN FOR NEXT SESSION: Review HEP knowledge/results, LE strengthening, manual and modalities as needed       Sharmon Leyden, PT, MPT 05/21/2023, 2:07 PM   Date of referral: 05/09/23 Referring provider: Madelyn Brunner, DO  Referring diagnosis?  Diagnosis  M25.562,G89.29 (ICD-10-CM) - Chronic pain of left knee  M17.12 (ICD-10-CM) - Unilateral primary osteoarthritis, left knee   Treatment diagnosis? (if different than referring diagnosis) M25.562, M62.81, R26.2, R60.0  What was this (referring dx) caused by? Arthritis  Nature of Condition: Chronic (continuous duration > 3 months)   Laterality: Lt  Current Functional Measure Score: FOTO 43%  Objective measurements identify impairments when they are compared to normal values, the uninvolved extremity, and prior level of function.  [x]  Yes  []  No  Objective assessment of functional ability: Moderate functional limitations   Briefly describe symptoms: pain, decreased ROM, decreased strength, decreased gait  How did symptoms start: arthritis, worsening over time  Average pain intensity:  Last 24 hours: 6-7 / 10  Past week: 6-7/ 10  How often does the pt experience symptoms? Frequently  How much have the symptoms interfered with usual daily activities? Quite a bit  How has condition changed since care began at this facility? NA - initial visit  In general, how is the  patients overall health? Good   BACK PAIN (STarT Back Screening Tool) No

## 2023-05-22 ENCOUNTER — Encounter: Payer: Self-pay | Admitting: Family Medicine

## 2023-05-22 ENCOUNTER — Ambulatory Visit: Payer: 59 | Admitting: Family Medicine

## 2023-05-22 VITALS — BP 132/72 | HR 74 | Temp 98.4°F | Ht 62.5 in | Wt 232.1 lb

## 2023-05-22 DIAGNOSIS — R5383 Other fatigue: Secondary | ICD-10-CM | POA: Diagnosis not present

## 2023-05-22 DIAGNOSIS — M791 Myalgia, unspecified site: Secondary | ICD-10-CM | POA: Diagnosis not present

## 2023-05-22 DIAGNOSIS — M2550 Pain in unspecified joint: Secondary | ICD-10-CM | POA: Diagnosis not present

## 2023-05-22 DIAGNOSIS — Z6841 Body Mass Index (BMI) 40.0 and over, adult: Secondary | ICD-10-CM

## 2023-05-22 LAB — HEPATIC FUNCTION PANEL
ALT: 23 U/L (ref 0–35)
AST: 21 U/L (ref 0–37)
Albumin: 4.6 g/dL (ref 3.5–5.2)
Alkaline Phosphatase: 64 U/L (ref 39–117)
Bilirubin, Direct: 0.1 mg/dL (ref 0.0–0.3)
Total Bilirubin: 0.5 mg/dL (ref 0.2–1.2)
Total Protein: 7.4 g/dL (ref 6.0–8.3)

## 2023-05-22 LAB — CBC WITH DIFFERENTIAL/PLATELET
Basophils Absolute: 0.1 10*3/uL (ref 0.0–0.1)
Basophils Relative: 0.8 % (ref 0.0–3.0)
Eosinophils Absolute: 0.2 10*3/uL (ref 0.0–0.7)
Eosinophils Relative: 2.4 % (ref 0.0–5.0)
HCT: 41.3 % (ref 36.0–46.0)
Hemoglobin: 13.5 g/dL (ref 12.0–15.0)
Lymphocytes Relative: 23.8 % (ref 12.0–46.0)
Lymphs Abs: 2.1 10*3/uL (ref 0.7–4.0)
MCHC: 32.8 g/dL (ref 30.0–36.0)
MCV: 83.7 fL (ref 78.0–100.0)
Monocytes Absolute: 0.6 10*3/uL (ref 0.1–1.0)
Monocytes Relative: 7.2 % (ref 3.0–12.0)
Neutro Abs: 5.9 10*3/uL (ref 1.4–7.7)
Neutrophils Relative %: 65.8 % (ref 43.0–77.0)
Platelets: 280 10*3/uL (ref 150.0–400.0)
RBC: 4.93 Mil/uL (ref 3.87–5.11)
RDW: 14.5 % (ref 11.5–15.5)
WBC: 9 10*3/uL (ref 4.0–10.5)

## 2023-05-22 LAB — BASIC METABOLIC PANEL
BUN: 17 mg/dL (ref 6–23)
CO2: 31 meq/L (ref 19–32)
Calcium: 9.9 mg/dL (ref 8.4–10.5)
Chloride: 105 meq/L (ref 96–112)
Creatinine, Ser: 0.77 mg/dL (ref 0.40–1.20)
GFR: 85.15 mL/min (ref >60.00)
Glucose, Bld: 79 mg/dL (ref 70–99)
Potassium: 4.2 meq/L (ref 3.5–5.1)
Sodium: 142 meq/L (ref 135–145)

## 2023-05-22 LAB — B12 AND FOLATE PANEL
Folate: >25.2 ng/mL (ref >5.9)
Vitamin B-12: 430 pg/mL (ref 211–911)

## 2023-05-22 LAB — TSH: TSH: 3.29 u[IU]/mL (ref 0.35–5.50)

## 2023-05-22 LAB — VITAMIN D 25 HYDROXY (VIT D DEFICIENCY, FRACTURES): VITD: 44.72 ng/mL (ref 30.00–100.00)

## 2023-05-22 LAB — SEDIMENTATION RATE: Sed Rate: 10 mm/h (ref 0–30)

## 2023-05-22 NOTE — Patient Instructions (Signed)
Follow up as needed or as scheduled We'll notify you of your lab results and make any changes if needed Your labs will help [place] determine the next steps Keep up the good work on healthy diet and regular physical activity- you can do it! Call with any questions or concerns Stay Safe!  Stay Healthy!

## 2023-05-22 NOTE — Assessment & Plan Note (Signed)
Ongoing issue for pt.  She reports she eats fairly well and is frustrated w/ her lack of weight loss.  She admits to limited activity due to ongoing joint and muscle pain.  She is to check w/ insurance and see if any weight loss medications are covered.  Will continue to follow.

## 2023-05-22 NOTE — Assessment & Plan Note (Signed)
Pt has hx of similar.  Likely multifactorial- weight, pain, mood are all playing a part.  Will check labs to determine if there is a metabolic component as well.  Encouraged regular physical activity to help boost energy level.  Will follow.

## 2023-05-22 NOTE — Assessment & Plan Note (Signed)
Deteriorated.  Pt reports pain in muscles and joints significantly worsened after stopping Meloxicam.  She is worried that she may again be having statin intolerance w/ elevated CK.  Will check labs to assess for rhabdo and possible rheumatologic processes.  Will follow.

## 2023-05-22 NOTE — Telephone Encounter (Signed)
Noted  Appt 05/22/2023

## 2023-05-22 NOTE — Progress Notes (Signed)
   Subjective:    Patient ID: Jordan Vang, female    DOB: [birth year], 59 y.o.   MRN: [id number]  HPI Fatigue- pt notes that when she stopped her Meloxicam in anticipation of a PRP injxn in her knee she developed worsening joint pains and myalgias.  At this point, is not able to walk comfortably.  Also noted increased fatigue.  Has not restarted Meloxicam but did start PT yesterday which she finds is helpful for pain/fatigue.  Pt reports she felt terrible when on a statin previously.  Is worried this may be the case again.  Is also concerned that weight is playing a role in her pain and fatigue.     Review of Systems For ROS see HPI     Objective:   Physical Exam Vitals reviewed.  Constitutional:      Appearance: Normal appearance. She is obese. She is not ill-appearing.  HENT:     Head: Normocephalic and atraumatic.  Eyes:     Extraocular Movements: Extraocular movements intact.     Conjunctiva/sclera: Conjunctivae normal.  Cardiovascular:     Rate and Rhythm: Normal rate and regular rhythm.  Pulmonary:     Effort: Pulmonary effort is normal. No respiratory distress.     Breath sounds: No wheezing or rhonchi.  Musculoskeletal:     Cervical back: Neck supple.  Skin:    General: Skin is warm and dry.     Coloration: Skin is not pale.  Neurological:     General: No focal deficit present.     Mental Status: She is alert and oriented to person, place, and time.  Psychiatric:        Mood and Affect: Mood normal.        Behavior: Behavior normal.        Thought Content: Thought content normal.           Assessment & Plan:

## 2023-05-23 ENCOUNTER — Encounter: Payer: Self-pay | Admitting: Family Medicine

## 2023-05-23 LAB — ANA: Anti Nuclear Antibody (ANA): NEGATIVE

## 2023-05-24 ENCOUNTER — Telehealth: Payer: Self-pay

## 2023-05-24 LAB — CK: Total CK: 108 U/L (ref 7–177)

## 2023-05-24 NOTE — Telephone Encounter (Signed)
The CK results are not back.  Those are the only ones not resulted.  Based on the results available, we do not need to refer to Rheumatology.  IF the CK is positive, we will adjust the cholesterol medication- which we can do w/o a specialty referral.  Your insurance doesn't cover Ozempic or Mounjaro for weight loss b/c those are the diabetes versions of those medications.  We need to determine if your insurance covers Wegovy or Zepbound- the weight loss versions.  Or we can try and send in a medication and see if we can get it approved.

## 2023-05-24 NOTE — Telephone Encounter (Signed)
-----   Message from Neena Rhymes sent at 05/24/2023  7:46 AM EST ----- Labs all look good!  This is good news and means we can move forward

## 2023-05-27 ENCOUNTER — Ambulatory Visit (INDEPENDENT_AMBULATORY_CARE_PROVIDER_SITE_OTHER): Payer: 59 | Admitting: Physical Therapy

## 2023-05-27 ENCOUNTER — Encounter: Payer: Self-pay | Admitting: Physical Therapy

## 2023-05-27 ENCOUNTER — Telehealth: Payer: Self-pay

## 2023-05-27 DIAGNOSIS — M25562 Pain in left knee: Secondary | ICD-10-CM | POA: Diagnosis not present

## 2023-05-27 DIAGNOSIS — R262 Difficulty in walking, not elsewhere classified: Secondary | ICD-10-CM | POA: Diagnosis not present

## 2023-05-27 DIAGNOSIS — R600 Localized edema: Secondary | ICD-10-CM | POA: Diagnosis not present

## 2023-05-27 DIAGNOSIS — M6281 Muscle weakness (generalized): Secondary | ICD-10-CM | POA: Diagnosis not present

## 2023-05-27 DIAGNOSIS — G8929 Other chronic pain: Secondary | ICD-10-CM

## 2023-05-27 NOTE — Therapy (Signed)
OUTPATIENT PHYSICAL THERAPY TREATMENT  Patient Name: Jordan Vang MRN: [id number] DOB:March 01, 1966, 58 y.o., female Today's Date: 05/27/2023  END OF SESSION:  PT End of Session - 05/27/23 0811     Visit Number 2    Number of Visits 20    Date for PT Re-Evaluation 07/30/23    Authorization Type [employer]    PT Start Time 0800    PT Stop Time 0848    PT Time Calculation (min) 48 min    Activity Tolerance Patient tolerated treatment well    Behavior During Therapy WFL for tasks assessed/performed             Past Medical History:  Diagnosis Date   Allergy    Anemia    Arthritis    Cancer (HCC)    basal cell cancers, MOH's on lip, several on back   Elevated cholesterol    Hypothyroidism    PMDD (premenstrual dysphoric disorder)    Dr Audie Box   Sleep apnea    CPAP   Thyroid disease    Past Surgical History:  Procedure Laterality Date   BASAL CELL CARCINOMA EXCISION     Mohs surgery on Lip   KNEE SURGERY Right 04/23/2013   arthroscopy   TONSILLECTOMY     TOTAL HIP ARTHROPLASTY Left 08/02/2020   Procedure: LEFT TOTAL HIP ARTHROPLASTY ANTERIOR APPROACH;  Surgeon: Kathryne Hitch, MD;  Location: [facility];  Service: Orthopedics;  Laterality: Left;   TOTAL HIP ARTHROPLASTY Right 05/11/2022   Procedure: RIGHT TOTAL HIP ARTHROPLASTY ANTERIOR APPROACH;  Surgeon: Kathryne Hitch, MD;  Location: [facility];  Service: Orthopedics;  Laterality: Right;   Patient Active Problem List   Diagnosis Date Noted   Vitamin D deficiency 12/07/2022   Status post total replacement of right hip 05/24/2022   Chronic pain of both knees 12/05/2021   Right hip pain 12/05/2021   Physical exam 11/28/2021   Acquired hypothyroidism 05/30/2021   Adverse effect of antihyperlipidemic and antiarteriosclerotic drugs, initial encounter 05/30/2021   Allergic rhinitis 05/30/2021   Chronic pain syndrome 05/30/2021   Dyslipidemia 05/30/2021   Myalgia 05/30/2021   Primary osteoarthritis 05/30/2021    Recurrent major depression in remission (HCC) 05/30/2021   Solitary pulmonary nodule 05/30/2021   Sleep pattern disturbance 05/30/2021   OSA (obstructive sleep apnea) 05/30/2021   Status post left hip replacement 08/02/2020   B12 deficiency 11/22/2015   Fatigue 08/16/2015   Basal cell carcinoma, lip 10/28/2013   Morbid obesity (HCC) 10/28/2013    PCP: Sheliah Hatch, MD   REFERRING PROVIDER: Madelyn Brunner, DO   REFERRING DIAG:  Diagnosis  [phone] (ICD-10-CM) - Chronic pain of left knee  M17.12 (ICD-10-CM) - Unilateral primary osteoarthritis, left knee       THERAPY DIAG:  Chronic pain of left knee  Muscle weakness (generalized)  Difficulty in walking, not elsewhere classified  Localized edema  Rationale for Evaluation and Treatment: Rehabilitation  ONSET DATE: over a year  SUBJECTIVE:   SUBJECTIVE STATEMENT: Stating her knee is sore today overall, 6/10 pain depending on what she does  PERTINENT HISTORY: Knee PRP injection on 05/09/23 Bilateral hip replacements left 2022, Rt 2024, anemia, arthritis, cancer , thyroid disease, sleep apnea ,knee surgery 2015   PAIN:  NPRS scale: 0/10 at rest, worse pain 6/10 Pain location: left knee joint, distal patella  Pain description: achy, radiation Aggravating factors: standing, walking Relieving factors: resting  PRECAUTIONS: None   WEIGHT BEARING RESTRICTIONS: No  FALLS:  Has patient fallen in  last 6 months? No  LIVING ENVIRONMENT: Lives with: lives with their family and lives with their spouse Lives in: House/apartment Stairs: Yes: External: 5 steps; on right going up Has following equipment at home: None  OCCUPATION: no  PLOF: Independent  PATIENT GOALS: walk better without pain  Next MD visit:   OBJECTIVE:   DIAGNOSTIC FINDINGS:  Result Date: 03/06/2023 Limited musculoskeletal ultrasound of the left knee was performed today.  Evaluation of the anterior knee shows a small joint effusion  in the suprapatellar pouch.  There is spurring on both ends and the undersurface of the patella.  Overlying quadricep tendon intact.  Evaluation of the posterior capsule shows no Baker's cyst.  There is a mixed echogenic structure that is approximately 1.25 x 0.9 cm in nature, likely favoring a ganglion cyst given the location.   PATIENT SURVEYS:  05/21/23; FOTO intake:  43%  COGNITION: Overall cognitive status: WFL    SENSATION: Pt reporting numbness down Rt lateral thigh since her hip replacement  EDEMA:  Circumferential: left 44.5 centimeters, Rt 45 centimeters   PALPATION: No pin point tenderness  LOWER EXTREMITY ROM:   ROM Right 05/21/23 Left 05/21/23  Hip flexion    Hip extension    Hip abduction    Hip adduction    Hip internal rotation    Hip external rotation    Knee flexion 115 108  Knee extension 0 -10  Ankle dorsiflexion    Ankle plantarflexion    Ankle inversion    Ankle eversion     (Blank rows = not tested)  LOWER EXTREMITY MMT:  MMT Right 05/21/23 Left 05/21/23  Hip flexion    Hip extension    Hip abduction    Hip adduction    Hip internal rotation    Hip external rotation    Knee flexion    Knee extension    Ankle dorsiflexion    Ankle plantarflexion    Ankle inversion    Ankle eversion     (Blank rows = not tested)    FUNCTIONAL TESTS:  5 time sit to stand: 12.3 seconds c UE support  GAIT: Distance walked: clinic distances Assistive device utilized: None Level of assistance: Complete Independence Comments: mild antalgic gait with decreased left knee extension.                                                                                                                                                                         TODAY'S TREATMENT  DATE: 05/27/23 05/21/23 Therex: Recumbent bike L1  X8 min, seat #6 Slantboard 30 sec  X3  Theractivity: (ROM and  strength for stairs, sit to stand, squats,) Leg press DL 78# 2N56, then SL left only 31# 2X10 Step ups 4 inch step with bilat UE support, leading left leg, X 10 forward and X 10 lateral. LAQ 2# 2X10 Sit to stand 2 X 10 with airex pad in chair 2X10 Seated SLR 2X10  -Vasopnuematic device X 10 min, medium compression, 34 deg to Lt knee   05/21/23 Therex:    HEP instruction/performance c cues for techniques, handout provided.  Trial set performed of each for comprehension and symptom assessment.  See below for exercise list  PATIENT EDUCATION:  Education details: HEP, POC Person educated: Patient Education method: Explanation, Demonstration, Verbal cues, and Handouts Education comprehension: verbalized understanding, returned demonstration, and verbal cues required  HOME EXERCISE PROGRAM: Access Code: [email or web address] URL: [email or web address] Date: 05/21/2023 Prepared by: Narda Amber  Exercises - Supine Bridge  - 2 x daily - 7 x weekly - 2 sets - 10 reps - 5 seconds hold - Seated Hamstring Stretch  - 2 x daily - 7 x weekly - 3-5 reps - 30 seconds hold - Supine Hamstring Stretch with Strap  - 2 x daily - 7 x weekly - 3-5 reps - 30 seconds hold - Seated Small Alternating Straight Leg Lifts with Heel Touch  - 2 x daily - 7 x weekly - 2 sets - 10 reps - Sit to Stand  - 2 x daily - 7 x weekly - 2 sets - 10 reps  ASSESSMENT:  CLINICAL IMPRESSION: She had some soreness and pain with exercises to tried to modify and back down some as tolerated. Did use vasopnumatic afterwords to decrease pain, swelling, and edema in her knee.   OBJECTIVE IMPAIRMENTS: decreased mobility, difficulty walking, decreased ROM, decreased strength, and pain.   ACTIVITY LIMITATIONS: bending, sitting, standing, squatting, sleeping, stairs, and transfers  PARTICIPATION LIMITATIONS: cleaning, laundry, driving, and community activity  PERSONAL FACTORS: 3+ comorbidities: see PMH above  are also affecting  patient's functional outcome.   REHAB POTENTIAL: Good  CLINICAL DECISION MAKING: Stable/uncomplicated  EVALUATION COMPLEXITY: Low   GOALS: Goals reviewed with patient? Yes  SHORT TERM GOALS: (target date for Short term goals are 3 weeks 06/11/2023)   1.  Patient will demonstrate independent use of home exercise program to maintain progress from in clinic treatments.  Goal status: New  2. Pt will improve her 5 time sit to stand to </= 10 seconds c/s UE support   Goal Status: New  LONG TERM GOALS: (target dates for all long term goals are 10 weeks  07/30/2023 )   1. Patient will demonstrate/report pain at worst less than or equal to 2/10 to facilitate minimal limitation in daily activity secondary to pain symptoms.  Goal status: New   2. Patient will demonstrate independent use of home exercise program to facilitate ability to maintain/progress functional gains from skilled physical therapy services.  Goal status: New   3. Patient will demonstrate FOTO outcome > or = 58 % to indicate reduced disability due to condition.  Goal status: New   4.  Patient will demonstrate >/= 4+ /5 MMT of left LE throughout to faciltiate usual transfers, stairs, squatting at [facility] for daily life.   Goal status: New   5.  Patient will demonstrate ability to navigate a flight of stairs with reciprocal gait pattern with single hand rail.  Goal  status: New   6.  Pt will improve her left knee flexion to >/= 115 degrees for improved funcitonal mobility.  Goal status: New     PLAN:  PT FREQUENCY: 1-2x/week  PT DURATION: 10 weeks  PLANNED INTERVENTIONS: Can include 36644- PT Re-evaluation, 97110-Therapeutic exercises, 97530- Therapeutic activity, [id number]- Neuromuscular re-education, 97535- Self Care, 97140- Manual therapy, [phone]- Gait training, [phone]- Orthotic Fit/training, [phone]- Canalith repositioning, [id number]- Aquatic Therapy, 97014- Electrical stimulation (unattended), [id number]- Electrical  stimulation (manual), [id number] Physical performance testing, 97016- Vasopneumatic device, [id number]- Ultrasound, [id number]- Traction (mechanical), [id number]- Ionotophoresis 4mg /ml Dexamethasone, Patient/Family education, Balance training, Stair training, Taping, Dry Needling, Joint mobilization, Joint manipulation, Spinal manipulation, Spinal mobilization, Scar mobilization, Vestibular training, Visual/preceptual remediation/compensation, DME instructions, Cryotherapy, and Moist heat.  All performed as medically necessary.  All included unless contraindicated  PLAN FOR NEXT SESSION: LE strengthening, manual and modalities as needed    April Manson, PT, DPT 05/27/2023, 8:42 AM   Date of referral: 05/09/23 Referring provider: Madelyn Brunner, DO  Referring diagnosis?  Diagnosis  M25.562,G89.29 (ICD-10-CM) - Chronic pain of left knee  M17.12 (ICD-10-CM) - Unilateral primary osteoarthritis, left knee   Treatment diagnosis? (if different than referring diagnosis) M25.562, M62.81, R26.2, R60.0  What was this (referring dx) caused by? Arthritis  Nature of Condition: Chronic (continuous duration > 3 months)   Laterality: Lt  Current Functional Measure Score: FOTO 43%  Objective measurements identify impairments when they are compared to normal values, the uninvolved extremity, and prior level of function.  [x]  Yes  []  No  Objective assessment of functional ability: Moderate functional limitations   Briefly describe symptoms: pain, decreased ROM, decreased strength, decreased gait  How did symptoms start: arthritis, worsening over time  Average pain intensity:  Last 24 hours: 6-7 / 10  Past week: 6-7/ 10  How often does the pt experience symptoms? Frequently  How much have the symptoms interfered with usual daily activities? Quite a bit  How has condition changed since care began at this facility? NA - initial visit  In general, how is the patients overall health? Good   BACK PAIN (STarT Back  Screening Tool) No

## 2023-05-27 NOTE — Telephone Encounter (Signed)
-----   Message from Neena Rhymes sent at 05/24/2023 10:27 PM EST ----- CK is normal.  No evidence of muscle breakdown

## 2023-05-27 NOTE — Telephone Encounter (Signed)
Patient reviewed results in MyChart message and has responded with questions

## 2023-05-27 NOTE — Telephone Encounter (Signed)
Patient notes doing better compared to previous days, wondering if this could have been viral illness, please advise

## 2023-05-28 MED ORDER — TIRZEPATIDE-WEIGHT MANAGEMENT 2.5 MG/0.5ML ~~LOC~~ SOLN: 2.5000 mg | SUBCUTANEOUS | 1 refills | Status: AC

## 2023-05-28 NOTE — Addendum Note (Signed)
Addended by: Sheliah Hatch on: 05/28/2023 07:49 AM   Modules accepted: Orders

## 2023-05-29 ENCOUNTER — Other Ambulatory Visit (HOSPITAL_COMMUNITY): Payer: Self-pay

## 2023-05-31 ENCOUNTER — Other Ambulatory Visit (HOSPITAL_COMMUNITY): Payer: Self-pay

## 2023-05-31 ENCOUNTER — Telehealth: Payer: Self-pay

## 2023-05-31 NOTE — Telephone Encounter (Signed)
 Pharmacy Patient Advocate Encounter   Received notification from Patient Advice Request messages that prior authorization for Zepbound  2.5MG /0.5ML pen-injectors is required/requested.   Insurance verification completed.   The patient is insured through [facility] .   Per test claim: PA required; PA submitted to above mentioned insurance via CoverMyMeds Key/confirmation #/EOC [id number] Status is pending

## 2023-06-03 ENCOUNTER — Ambulatory Visit (INDEPENDENT_AMBULATORY_CARE_PROVIDER_SITE_OTHER): Payer: 59 | Admitting: Physical Therapy

## 2023-06-03 ENCOUNTER — Encounter: Payer: Self-pay | Admitting: Physical Therapy

## 2023-06-03 DIAGNOSIS — M6281 Muscle weakness (generalized): Secondary | ICD-10-CM | POA: Diagnosis not present

## 2023-06-03 DIAGNOSIS — M25562 Pain in left knee: Secondary | ICD-10-CM | POA: Diagnosis not present

## 2023-06-03 DIAGNOSIS — R262 Difficulty in walking, not elsewhere classified: Secondary | ICD-10-CM | POA: Diagnosis not present

## 2023-06-03 DIAGNOSIS — G8929 Other chronic pain: Secondary | ICD-10-CM

## 2023-06-03 DIAGNOSIS — R600 Localized edema: Secondary | ICD-10-CM | POA: Diagnosis not present

## 2023-06-03 NOTE — Telephone Encounter (Signed)
 Pharmacy Patient Advocate Encounter  Received notification from [employer] that Prior Authorization for Zepbound 2.5MG /0.5ML pen-injectors  has been DENIED.  See denial reason below. No denial letter attached in CMM. Will attach denial letter to Media tab once received.   PA #/Case ID/Reference #: [id number]   DENIAL REASON: Plan exclusion

## 2023-06-03 NOTE — Telephone Encounter (Signed)
 Please let pt know that the injectable weight loss medications are not a covered benefit as part of her insurance plan.  I'm not sure how she would like to proceed.  She could possibly call the compounding pharmacy of her choice and see how much it would cost for Tirzepatide or Semaglutide

## 2023-06-03 NOTE — Telephone Encounter (Signed)
 Zepbound was denied, please advise what you think next best step is

## 2023-06-03 NOTE — Telephone Encounter (Signed)
 Left vm to call office

## 2023-06-03 NOTE — Therapy (Signed)
 OUTPATIENT PHYSICAL THERAPY TREATMENT  Patient Name: Jordan Vang MRN: [id number] DOB:[birth year], 58 y.o., female Today's Date: 06/03/2023  END OF SESSION:  PT End of Session - 06/03/23 0848     Visit Number 3    Number of Visits 20    Date for PT Re-Evaluation 07/30/23    Authorization Type [employer]    PT Start Time 0805    PT Stop Time 0845    PT Time Calculation (min) 40 min    Activity Tolerance Patient tolerated treatment well    Behavior During Therapy WFL for tasks assessed/performed              Past Medical History:  Diagnosis Date   Allergy    Anemia    Arthritis    Cancer (HCC)    basal cell cancers, MOH's on lip, several on back   Elevated cholesterol    Hypothyroidism    PMDD (premenstrual dysphoric disorder)    Dr Sharie Dauphin   Sleep apnea    CPAP   Thyroid  disease    Past Surgical History:  Procedure Laterality Date   BASAL CELL CARCINOMA EXCISION     Mohs surgery on Lip   KNEE SURGERY Right 04/23/2013   arthroscopy   TONSILLECTOMY     TOTAL HIP ARTHROPLASTY Left 08/02/2020   Procedure: LEFT TOTAL HIP ARTHROPLASTY ANTERIOR APPROACH;  Surgeon: Arnie Lao, MD;  Location: [facility];  Service: Orthopedics;  Laterality: Left;   TOTAL HIP ARTHROPLASTY Right 05/11/2022   Procedure: RIGHT TOTAL HIP ARTHROPLASTY ANTERIOR APPROACH;  Surgeon: Arnie Lao, MD;  Location: [facility];  Service: Orthopedics;  Laterality: Right;   Patient Active Problem List   Diagnosis Date Noted   Vitamin D  deficiency 12/07/2022   Status post total replacement of right hip 05/24/2022   Chronic pain of both knees 12/05/2021   Right hip pain 12/05/2021   Physical exam 11/28/2021   Acquired hypothyroidism 05/30/2021   Adverse effect of antihyperlipidemic and antiarteriosclerotic drugs, initial encounter 05/30/2021   Allergic rhinitis 05/30/2021   Chronic pain syndrome 05/30/2021   Dyslipidemia 05/30/2021   Myalgia 05/30/2021   Primary osteoarthritis 05/30/2021    Recurrent major depression in remission (HCC) 05/30/2021   Solitary pulmonary nodule 05/30/2021   Sleep pattern disturbance 05/30/2021   OSA (obstructive sleep apnea) 05/30/2021   Status post left hip replacement 08/02/2020   B12 deficiency 11/22/2015   Fatigue 08/16/2015   Basal cell carcinoma, lip 10/28/2013   Morbid obesity (HCC) 10/28/2013    PCP: Jess Morita, MD   REFERRING PROVIDER: Shauna Del, DO   REFERRING DIAG:  Diagnosis  [phone] (ICD-10-CM) - Chronic pain of left knee  M17.12 (ICD-10-CM) - Unilateral primary osteoarthritis, left knee       THERAPY DIAG:  Chronic pain of left knee  Muscle weakness (generalized)  Difficulty in walking, not elsewhere classified  Localized edema  Rationale for Evaluation and Treatment: Rehabilitation  ONSET DATE: over a year  SUBJECTIVE:   SUBJECTIVE STATEMENT: States her knee is sore and painful, she was on her feet a lot yesterday. She has been doing all the exercises, they help for a little bit and then the pain seems to come back  PERTINENT HISTORY: Knee PRP injection on 05/09/23 Bilateral hip replacements left 2022, Rt 2024, anemia, arthritis, cancer , thyroid  disease, sleep apnea ,knee surgery 2015   PAIN:  NPRS scale: 6/10 Pain location: left knee joint, distal patella  Pain description: achy, radiation Aggravating factors: standing, walking Relieving  factors: resting  PRECAUTIONS: None   WEIGHT BEARING RESTRICTIONS: No  FALLS:  Has patient fallen in last 6 months? No  LIVING ENVIRONMENT: Lives with: lives with their family and lives with their spouse Lives in: House/apartment Stairs: Yes: External: 5 steps; on right going up Has following equipment at home: None  OCCUPATION: no  PLOF: Independent  PATIENT GOALS: walk better without pain  Next MD visit:   OBJECTIVE:   DIAGNOSTIC FINDINGS:  Result Date: 03/06/2023 Limited musculoskeletal ultrasound of the left knee was  performed today.  Evaluation of the anterior knee shows a small joint effusion in the suprapatellar pouch.  There is spurring on both ends and the undersurface of the patella.  Overlying quadricep tendon intact.  Evaluation of the posterior capsule shows no Baker's cyst.  There is a mixed echogenic structure that is approximately 1.25 x 0.9 cm in nature, likely favoring a ganglion cyst given the location.   PATIENT SURVEYS:  05/21/23; FOTO intake:  43%  COGNITION: Overall cognitive status: WFL    SENSATION: Pt reporting numbness down Rt lateral thigh since her hip replacement  EDEMA:  Circumferential: left 44.5 centimeters, Rt 45 centimeters   PALPATION: No pin point tenderness  LOWER EXTREMITY ROM:   ROM Right 05/21/23 Left 05/21/23  Hip flexion    Hip extension    Hip abduction    Hip adduction    Hip internal rotation    Hip external rotation    Knee flexion 115 108  Knee extension 0 -10  Ankle dorsiflexion    Ankle plantarflexion    Ankle inversion    Ankle eversion     (Blank rows = not tested)  LOWER EXTREMITY MMT:  MMT Right 05/21/23 Left 05/21/23  Hip flexion    Hip extension    Hip abduction    Hip adduction    Hip internal rotation    Hip external rotation    Knee flexion    Knee extension    Ankle dorsiflexion    Ankle plantarflexion    Ankle inversion    Ankle eversion     (Blank rows = not tested)    FUNCTIONAL TESTS:  5 time sit to stand: 12.3 seconds c UE support  GAIT: Distance walked: clinic distances Assistive device utilized: None Level of assistance: Complete Independence Comments: mild antalgic gait with decreased left knee extension.                                                                                                                                                                         TODAY'S TREATMENT  DATE: 06/03/23 Therex: Nu step L5 X 8 min  UE/LE seat#8 Slantboard 30 sec  X3 Seated Hamstring stretch 30 sec X 3 bilat  Theractivity: (ROM and strength for stairs, sit to stand, squats,) Leg press DL 16# 1W96, then SL left only 31# 2X10 Step ups 4 inch step with bilat UE support, leading left leg, X 10 forward and X 10 lateral. LAQ 3# 2X15 Sit to stand 2 X 10 with airex pad in chair 2X10 Seated SLR 2X10   05/27/23 Therex: Recumbent bike L1  X8 min, seat #6 Slantboard 30 sec  X3   Theractivity: (ROM and strength for stairs, sit to stand, squats,) Leg press DL 04# 5W09, then SL left only 31# 2X10 Step ups 4 inch step with bilat UE support, leading left leg, X 10 forward and X 10 lateral. LAQ 2# 2X10 Sit to stand 2 X 10 with airex pad in chair 2X10 Seated SLR 2X10   -Vasopnuematic device X 10 min, medium compression, 34 deg to Lt knee  05/21/23 Therex:    HEP instruction/performance c cues for techniques, handout provided.  Trial set performed of each for comprehension and symptom assessment.  See below for exercise list  PATIENT EDUCATION:  Education details: HEP, POC Person educated: Patient Education method: Explanation, Demonstration, Verbal cues, and Handouts Education comprehension: verbalized understanding, returned demonstration, and verbal cues required  HOME EXERCISE PROGRAM: Access Code: [email or web address] URL: [email or web address] Date: 05/21/2023 Prepared by: Jerrel Mor  Exercises - Supine Bridge  - 2 x daily - 7 x weekly - 2 sets - 10 reps - 5 seconds hold - Seated Hamstring Stretch  - 2 x daily - 7 x weekly - 3-5 reps - 30 seconds hold - Supine Hamstring Stretch with Strap  - 2 x daily - 7 x weekly - 3-5 reps - 30 seconds hold - Seated Small Alternating Straight Leg Lifts with Heel Touch  - 2 x daily - 7 x weekly - 2 sets - 10 reps - Sit to Stand  - 2 x daily - 7 x weekly - 2 sets - 10 reps  ASSESSMENT:  CLINICAL IMPRESSION: She is improving with strength but continues to have knee pain.  PT recommending to continue with current plan and she will try aquatic PT next visit.   OBJECTIVE IMPAIRMENTS: decreased mobility, difficulty walking, decreased ROM, decreased strength, and pain.   ACTIVITY LIMITATIONS: bending, sitting, standing, squatting, sleeping, stairs, and transfers  PARTICIPATION LIMITATIONS: cleaning, laundry, driving, and community activity  PERSONAL FACTORS: 3+ comorbidities: see PMH above  are also affecting patient's functional outcome.   REHAB POTENTIAL: Good  CLINICAL DECISION MAKING: Stable/uncomplicated  EVALUATION COMPLEXITY: Low   GOALS: Goals reviewed with patient? Yes  SHORT TERM GOALS: (target date for Short term goals are 3 weeks 06/11/2023)   1.  Patient will demonstrate independent use of home exercise program to maintain progress from in clinic treatments.  Goal status: New  2. Pt will improve her 5 time sit to stand to </= 10 seconds c/s UE support   Goal Status: New  LONG TERM GOALS: (target dates for all long term goals are 10 weeks  07/30/2023 )   1. Patient will demonstrate/report pain at worst less than or equal to 2/10 to facilitate minimal limitation in daily activity secondary to pain symptoms.  Goal status: New   2. Patient will demonstrate independent use of home exercise program to facilitate ability to maintain/progress functional gains from skilled physical therapy services.  Goal status: New  3. Patient will demonstrate FOTO outcome > or = 58 % to indicate reduced disability due to condition.  Goal status: New   4.  Patient will demonstrate >/= 4+ /5 MMT of left LE throughout to faciltiate usual transfers, stairs, squatting at [facility] for daily life.   Goal status: New   5.  Patient will demonstrate ability to navigate a flight of stairs with reciprocal gait pattern with single hand rail.  Goal status: New   6.  Pt will improve her left knee flexion to >/= 115 degrees for improved funcitonal mobility.  Goal  status: New     PLAN:  PT FREQUENCY: 1-2x/week  PT DURATION: 10 weeks  PLANNED INTERVENTIONS: Can include 27253- PT Re-evaluation, 97110-Therapeutic exercises, 97530- Therapeutic activity, [id number]- Neuromuscular re-education, 97535- Self Care, 97140- Manual therapy, [phone]- Gait training, [phone]- Orthotic Fit/training, [phone]- Canalith repositioning, [id number]- Aquatic Therapy, 97014- Electrical stimulation (unattended), [id number]- Electrical stimulation (manual), [id number] Physical performance testing, 97016- Vasopneumatic device, [id number]- Ultrasound, [id number]- Traction (mechanical), [id number]- Ionotophoresis 4mg /ml Dexamethasone , Patient/Family education, Balance training, Stair training, Taping, Dry Needling, Joint mobilization, Joint manipulation, Spinal manipulation, Spinal mobilization, Scar mobilization, Vestibular training, Visual/preceptual remediation/compensation, DME instructions, Cryotherapy, and Moist heat.  All performed as medically necessary.  All included unless contraindicated  PLAN FOR NEXT SESSION: LE strengthening, manual and modalities as needed    Mick Alamin, PT, DPT 06/03/2023, 8:49 AM   Date of referral: 05/09/23 Referring provider: Shauna Del, DO  Referring diagnosis?  Diagnosis  M25.562,G89.29 (ICD-10-CM) - Chronic pain of left knee  M17.12 (ICD-10-CM) - Unilateral primary osteoarthritis, left knee   Treatment diagnosis? (if different than referring diagnosis) M25.562, M62.81, R26.2, R60.0  What was this (referring dx) caused by? Arthritis  Nature of Condition: Chronic (continuous duration > 3 months)   Laterality: Lt  Current Functional Measure Score: FOTO 43%  Objective measurements identify impairments when they are compared to normal values, the uninvolved extremity, and prior level of function.  [x]  Yes  []  No  Objective assessment of functional ability: Moderate functional limitations   Briefly describe symptoms: pain, decreased ROM, decreased strength,  decreased gait  How did symptoms start: arthritis, worsening over time  Average pain intensity:  Last 24 hours: 6-7 / 10  Past week: 6-7/ 10  How often does the pt experience symptoms? Frequently  How much have the symptoms interfered with usual daily activities? Quite a bit  How has condition changed since care began at this facility? NA - initial visit  In general, how is the patients overall health? Good   BACK PAIN (STarT Back Screening Tool) No

## 2023-06-04 NOTE — Telephone Encounter (Signed)
Patient will check with compounding pharmacies and let us know what information she gets back

## 2023-06-05 ENCOUNTER — Encounter: Payer: Self-pay | Admitting: Family Medicine

## 2023-06-07 ENCOUNTER — Ambulatory Visit: Payer: 59 | Admitting: Physical Therapy

## 2023-06-07 ENCOUNTER — Encounter: Payer: Self-pay | Admitting: Physical Therapy

## 2023-06-07 DIAGNOSIS — R600 Localized edema: Secondary | ICD-10-CM | POA: Diagnosis not present

## 2023-06-07 DIAGNOSIS — M6281 Muscle weakness (generalized): Secondary | ICD-10-CM

## 2023-06-07 DIAGNOSIS — G8929 Other chronic pain: Secondary | ICD-10-CM

## 2023-06-07 DIAGNOSIS — M25562 Pain in left knee: Secondary | ICD-10-CM | POA: Diagnosis not present

## 2023-06-07 DIAGNOSIS — R262 Difficulty in walking, not elsewhere classified: Secondary | ICD-10-CM | POA: Diagnosis not present

## 2023-06-07 NOTE — Therapy (Addendum)
OUTPATIENT PHYSICAL THERAPY TREATMENT/Discharge  PHYSICAL THERAPY DISCHARGE SUMMARY  Visits from Start of Care: 4  Current functional level related to goals / functional outcomes: See below   Remaining deficits: See below   Education / Equipment: HEP  Plan:  Patient goals were some met. Patient is being discharged at her request due to financials as she says insurance is not covering PT until deductible. She feels she can continue with independent program.     Patient Name: Jordan Vang MRN: [id number] DOB:12/26/1965, 58 y.o., female Today's Date: 06/07/2023  END OF SESSION:  PT End of Session - 06/07/23 0816     Visit Number 4    Number of Visits 20    Date for PT Re-Evaluation 07/30/23    Authorization Type [employer]    PT Start Time 0801    PT Stop Time 0840    PT Time Calculation (min) 39 min    Activity Tolerance Patient tolerated treatment well    Behavior During Therapy WFL for tasks assessed/performed               Past Medical History:  Diagnosis Date   Allergy    Anemia    Arthritis    Cancer (HCC)    basal cell cancers, MOH's on lip, several on back   Elevated cholesterol    Hypothyroidism    PMDD (premenstrual dysphoric disorder)    Dr Audie Box   Sleep apnea    CPAP   Thyroid disease    Past Surgical History:  Procedure Laterality Date   BASAL CELL CARCINOMA EXCISION     Mohs surgery on Lip   KNEE SURGERY Right 04/23/2013   arthroscopy   TONSILLECTOMY     TOTAL HIP ARTHROPLASTY Left 08/02/2020   Procedure: LEFT TOTAL HIP ARTHROPLASTY ANTERIOR APPROACH;  Surgeon: Kathryne Hitch, MD;  Location: [facility];  Service: Orthopedics;  Laterality: Left;   TOTAL HIP ARTHROPLASTY Right 05/11/2022   Procedure: RIGHT TOTAL HIP ARTHROPLASTY ANTERIOR APPROACH;  Surgeon: Kathryne Hitch, MD;  Location: [facility];  Service: Orthopedics;  Laterality: Right;   Patient Active Problem List   Diagnosis Date Noted   Vitamin D deficiency 12/07/2022    Status post total replacement of right hip 05/24/2022   Chronic pain of both knees 12/05/2021   Right hip pain 12/05/2021   Physical exam 11/28/2021   Acquired hypothyroidism 05/30/2021   Adverse effect of antihyperlipidemic and antiarteriosclerotic drugs, initial encounter 05/30/2021   Allergic rhinitis 05/30/2021   Chronic pain syndrome 05/30/2021   Dyslipidemia 05/30/2021   Myalgia 05/30/2021   Primary osteoarthritis 05/30/2021   Recurrent major depression in remission (HCC) 05/30/2021   Solitary pulmonary nodule 05/30/2021   Sleep pattern disturbance 05/30/2021   OSA (obstructive sleep apnea) 05/30/2021   Status post left hip replacement 08/02/2020   B12 deficiency 11/22/2015   Fatigue 08/16/2015   Basal cell carcinoma, lip 10/28/2013   Morbid obesity (HCC) 10/28/2013    PCP: Sheliah Hatch, MD   REFERRING PROVIDER: Sheliah Hatch, MD   REFERRING DIAG:  Diagnosis  [id number] (ICD-10-CM) - Chronic pain of left knee  M17.12 (ICD-10-CM) - Unilateral primary osteoarthritis, left knee       THERAPY DIAG:  Chronic pain of left knee  Muscle weakness (generalized)  Difficulty in walking, not elsewhere classified  Localized edema  Rationale for Evaluation and Treatment: Rehabilitation  ONSET DATE: over a year  SUBJECTIVE:   SUBJECTIVE STATEMENT: States she woke up with pain, usually does, arrives  with 2/10 pain and not really feeling the pain in the water.  PERTINENT HISTORY: Knee PRP injection on 05/09/23 Bilateral hip replacements left 2022, Rt 2024, anemia, arthritis, cancer , thyroid disease, sleep apnea ,knee surgery 2015   PAIN:  NPRS scale: 2/10 Pain location: left knee joint, distal patella  Pain description: achy, radiation Aggravating factors: standing, walking Relieving factors: resting  PRECAUTIONS: None   WEIGHT BEARING RESTRICTIONS: No  FALLS:  Has patient fallen in last 6 months? No  LIVING ENVIRONMENT: Lives with: lives  with their family and lives with their spouse Lives in: House/apartment Stairs: Yes: External: 5 steps; on right going up Has following equipment at home: None  OCCUPATION: no  PLOF: Independent  PATIENT GOALS: walk better without pain  Next MD visit:   OBJECTIVE:   DIAGNOSTIC FINDINGS:  Result Date: 03/06/2023 Limited musculoskeletal ultrasound of the left knee was performed today.  Evaluation of the anterior knee shows a small joint effusion in the suprapatellar pouch.  There is spurring on both ends and the undersurface of the patella.  Overlying quadricep tendon intact.  Evaluation of the posterior capsule shows no Baker's cyst.  There is a mixed echogenic structure that is approximately 1.25 x 0.9 cm in nature, likely favoring a ganglion cyst given the location.   PATIENT SURVEYS:  05/21/23; FOTO intake:  43%  COGNITION: Overall cognitive status: WFL    SENSATION: Pt reporting numbness down Rt lateral thigh since her hip replacement  EDEMA:  Circumferential: left 44.5 centimeters, Rt 45 centimeters   PALPATION: No pin point tenderness  LOWER EXTREMITY ROM:   ROM Right 05/21/23 Left 05/21/23  Hip flexion    Hip extension    Hip abduction    Hip adduction    Hip internal rotation    Hip external rotation    Knee flexion 115 108  Knee extension 0 -10  Ankle dorsiflexion    Ankle plantarflexion    Ankle inversion    Ankle eversion     (Blank rows = not tested)  LOWER EXTREMITY MMT:  MMT Right 05/21/23 Left 05/21/23  Hip flexion    Hip extension    Hip abduction    Hip adduction    Hip internal rotation    Hip external rotation    Knee flexion    Knee extension    Ankle dorsiflexion    Ankle plantarflexion    Ankle inversion    Ankle eversion     (Blank rows = not tested)    FUNCTIONAL TESTS:  5 time sit to stand: 12.3 seconds c UE support  GAIT: Distance walked: clinic distances Assistive device utilized: None Level of assistance: Complete  Independence Comments: mild antalgic gait with decreased left knee extension.  TODAY'S TREATMENT                                                                          DATE: DATE:  06/07/23 Pt seen for aquatic therapy today.  Treatment took place in water 3.5-4.75 ft in depth at the [facility] pool. Temp of water was 91.  Pt entered/exited the pool via steps with hand rail.   Pt requires the buoyancy and hydrostatic pressure of water for support, and to offload joints by unweighting joint load by at least 50 % in navel deep water and by at least 75-80% in chest to neck deep water.  Viscosity of the water is needed for resistance of strengthening. Water current perturbations provides challenge to standing balance requiring increased core activation.   Aquatic PT exercises Forward walking 2 round trips horizontal width of pool Sidestepping 2 round trips horizontal width of pool Backwards  walking 2 round trips horizontal width of pool Standing hip abduction/adduction X 20 bilat Standing hip flexion/extension X 20 bilat Monster walking 3 round trips Tandem walking 3 round trips Walking marches down forward and back backwards 3 round trips Push/pull with kickboard X 15 each way, lunge stance Hip circles X 15 CW, X 15 CCW  bilat Lunge step to wall X 10 bilat Step ups onto first step of pool X 10 each side Sitting on pool bench for cycling and leg swings X 5 min  06/03/23 Therex: Nu step L5 X 8 min UE/LE seat#8 Slantboard 30 sec  X3 Seated Hamstring stretch 30 sec X 3 bilat  Theractivity: (ROM and strength for stairs, sit to stand, squats,) Leg press DL 16# 1W96, then SL left only 31# 2X10 Step ups 4 inch step with bilat UE support, leading left leg, X 10 forward and X 10 lateral. LAQ 3# 2X15 Sit to stand 2 X  10 with airex pad in chair 2X10 Seated SLR 2X10   05/27/23 Therex: Recumbent bike L1  X8 min, seat #6 Slantboard 30 sec  X3   Theractivity: (ROM and strength for stairs, sit to stand, squats,) Leg press DL 04# 5W09, then SL left only 31# 2X10 Step ups 4 inch step with bilat UE support, leading left leg, X 10 forward and X 10 lateral. LAQ 2# 2X10 Sit to stand 2 X 10 with airex pad in chair 2X10 Seated SLR 2X10   -Vasopnuematic device X 10 min, medium compression, 34 deg to Lt knee  PATIENT EDUCATION:  Education details: HEP, POC Person educated: Patient Education method: Programmer, multimedia, Facilities manager, Verbal cues, and Handouts Education comprehension: verbalized understanding, returned demonstration, and verbal cues required  HOME EXERCISE PROGRAM: Access Code: [email or web address] URL: [email or web address] Date: 05/21/2023 Prepared by: Narda Amber  Exercises - Supine Bridge  - 2 x daily - 7 x weekly - 2 sets - 10 reps - 5 seconds hold - Seated Hamstring Stretch  - 2 x daily - 7 x weekly - 3-5 reps - 30 seconds hold - Supine Hamstring Stretch with Strap  - 2 x daily - 7 x weekly - 3-5 reps - 30 seconds hold - Seated Small Alternating Straight Leg Lifts with Heel Touch  - 2 x daily - 7 x weekly - 2 sets -  10 reps - Sit to Stand  - 2 x daily - 7 x weekly - 2 sets - 10 reps  Aquatic HEP Access Code: [email or web address] URL: [email or web address] Date: 06/07/2023 Prepared by: Ivery Quale  Exercises - Standing March at [facility]  - 1 x daily - 2 x weekly - 15 reps - Standing Hip Flexion Extension at [employer]  - 1 x daily - 2 x weekly - 15 reps - Standing Hip Abduction Adduction at Pool Wall  - 1 x daily - 2 x weekly - 20 reps - Side Stepping  - 2 x daily - 2 x weekly - 4 sets - 10 reps - Forward March  - 1 x daily - 2 x weekly - 4 sets - Backward March  - 1 x daily - 2 x weekly - 4 sets - Squat  - 1 x daily - 2 x weekly - 15 reps - Lunge to [employer] at [employer]  - 1  x daily - 2 x weekly - 1 sets - 20 reps - Forward Monster Walk  - 1 x daily - 2 x weekly - 4 sets - Standing Single Leg Hip Circles  - 1 x daily - 2 x weekly - 1 sets - 20 reps  ASSESSMENT:  CLINICAL IMPRESSION: She had first aquatic PT session with good overall Obst with the water and tolerance to exercises. I did print out and laminate aquatic HEP for her as well. She relays this will need to be her last PT visit due to financials but she will continue with HEP independently and she [facility] the [facility] so she plans to do her water and land program there.   OBJECTIVE IMPAIRMENTS: decreased mobility, difficulty walking, decreased ROM, decreased strength, and pain.   ACTIVITY LIMITATIONS: bending, sitting, standing, squatting, sleeping, stairs, and transfers  PARTICIPATION LIMITATIONS: cleaning, laundry, driving, and community activity  PERSONAL FACTORS: 3+ comorbidities: see PMH above  are also affecting patient's functional outcome.   REHAB POTENTIAL: Good  CLINICAL DECISION MAKING: Stable/uncomplicated  EVALUATION COMPLEXITY: Low   GOALS: Goals reviewed with patient? Yes  SHORT TERM GOALS: (target date for Short term goals are 3 weeks 06/11/2023)   1.  Patient will demonstrate independent use of home exercise program to maintain progress from in clinic treatments.  Goal status: MET 06/07/23  2. Pt will improve her 5 time sit to stand to </= 10 seconds c/s UE support   Goal Status: ongoing 06/07/23  LONG TERM GOALS: (target dates for all long term goals are 10 weeks  07/30/2023 )   1. Patient will demonstrate/report pain at worst less than or equal to 2/10 to facilitate minimal limitation in daily activity secondary to pain symptoms.  Goal status: ongoing 06/07/23   2. Patient will demonstrate independent use of home exercise program to facilitate ability to maintain/progress functional gains from skilled physical therapy services.  Goal status:MET 06/07/23    3. Patient will demonstrate FOTO outcome > or = 58 % to indicate reduced disability due to condition.  Goal status: ongoing 06/07/23   4.  Patient will demonstrate >/= 4+ /5 MMT of left LE throughout to faciltiate usual transfers, stairs, squatting at [facility] for daily life.   Goal status: ongoing 06/07/23   5.  Patient will demonstrate ability to navigate a flight of stairs with reciprocal gait pattern with single hand rail.  Goal status: ongoing 06/07/23   6.  Pt will improve her left knee flexion to >/= 115  degrees for improved funcitonal mobility.  Goal status: ongoing 06/07/23     PLAN:  PT FREQUENCY: 1-2x/week  PT DURATION: 10 weeks  PLANNED INTERVENTIONS: Can include 16109- PT Re-evaluation, 97110-Therapeutic exercises, 97530- Therapeutic activity, 97112- Neuromuscular re-education, 97535- Self Care, 97140- Manual therapy, [phone]- Gait training, [phone]- Orthotic Fit/training, [phone]- Canalith repositioning, [id number]- Aquatic Therapy, 97014- Electrical stimulation (unattended), [id number]- Electrical stimulation (manual), [id number] Physical performance testing, 97016- Vasopneumatic device, [id number]- Ultrasound, [id number]- Traction (mechanical), [id number]- Ionotophoresis 4mg /ml Dexamethasone, Patient/Family education, Balance training, Stair training, Taping, Dry Needling, Joint mobilization, Joint manipulation, Spinal manipulation, Spinal mobilization, Scar mobilization, Vestibular training, Visual/preceptual remediation/compensation, DME instructions, Cryotherapy, and Moist heat.  All performed as medically necessary.  All included unless contraindicated  PLAN FOR NEXT SESSION: how did she feel after first water visit last time? LE strengthening, manual and modalities as needed    April Manson, PT, DPT 06/07/2023, 8:17 AM   Date of referral: 05/09/23 Referring provider: Sheliah Hatch, MD  Referring diagnosis?  Diagnosis  M25.562,G89.29 (ICD-10-CM) - Chronic pain of left knee  M17.12 (ICD-10-CM) -  Unilateral primary osteoarthritis, left knee   Treatment diagnosis? (if different than referring diagnosis) M25.562, M62.81, R26.2, R60.0  What was this (referring dx) caused by? Arthritis  Nature of Condition: Chronic (continuous duration > 3 months)   Laterality: Lt  Current Functional Measure Score: FOTO 43%  Objective measurements identify impairments when they are compared to normal values, the uninvolved extremity, and prior level of function.  [x]  Yes  []  No  Objective assessment of functional ability: Moderate functional limitations   Briefly describe symptoms: pain, decreased ROM, decreased strength, decreased gait  How did symptoms start: arthritis, worsening over time  Average pain intensity:  Last 24 hours: 6-7 / 10  Past week: 6-7/ 10  How often does the pt experience symptoms? Frequently  How much have the symptoms interfered with usual daily activities? Quite a bit  How has condition changed since care began at this facility? NA - initial visit  In general, how is the patients overall health? Good   BACK PAIN (STarT Back Screening Tool) No

## 2023-06-10 ENCOUNTER — Encounter: Payer: 59 | Admitting: Physical Therapy

## 2023-06-12 ENCOUNTER — Encounter: Payer: 59 | Admitting: Physical Therapy

## 2023-06-17 ENCOUNTER — Encounter: Payer: 59 | Admitting: Physical Therapy

## 2023-06-18 ENCOUNTER — Encounter: Payer: Self-pay | Admitting: Sports Medicine

## 2023-06-21 ENCOUNTER — Ambulatory Visit: Payer: 59 | Admitting: Physical Therapy

## 2023-06-25 ENCOUNTER — Other Ambulatory Visit: Payer: Self-pay | Admitting: Family Medicine

## 2023-06-25 DIAGNOSIS — E039 Hypothyroidism, unspecified: Secondary | ICD-10-CM

## 2023-07-10 ENCOUNTER — Other Ambulatory Visit: Payer: Self-pay | Admitting: Medical Genetics

## 2023-07-16 ENCOUNTER — Other Ambulatory Visit: Payer: Self-pay

## 2023-07-16 ENCOUNTER — Ambulatory Visit (INDEPENDENT_AMBULATORY_CARE_PROVIDER_SITE_OTHER)

## 2023-07-16 ENCOUNTER — Encounter: Payer: Self-pay | Admitting: Sports Medicine

## 2023-07-16 ENCOUNTER — Ambulatory Visit (INDEPENDENT_AMBULATORY_CARE_PROVIDER_SITE_OTHER): Payer: 59 | Admitting: Sports Medicine

## 2023-07-16 DIAGNOSIS — Z8269 Family history of other diseases of the musculoskeletal system and connective tissue: Secondary | ICD-10-CM | POA: Diagnosis not present

## 2023-07-16 DIAGNOSIS — M170 Bilateral primary osteoarthritis of knee: Secondary | ICD-10-CM

## 2023-07-16 DIAGNOSIS — M25562 Pain in left knee: Secondary | ICD-10-CM | POA: Diagnosis not present

## 2023-07-16 DIAGNOSIS — G8929 Other chronic pain: Secondary | ICD-10-CM | POA: Diagnosis not present

## 2023-07-16 DIAGNOSIS — M2550 Pain in unspecified joint: Secondary | ICD-10-CM

## 2023-07-16 DIAGNOSIS — M545 Low back pain, unspecified: Secondary | ICD-10-CM

## 2023-07-16 DIAGNOSIS — Z006 Encounter for examination for normal comparison and control in clinical research program: Secondary | ICD-10-CM

## 2023-07-16 NOTE — Progress Notes (Addendum)
 Jordan Vang - 58 y.o. female MRN [id number]  Date of birth: 24-May-1965  Office Visit Note: Visit Date: 07/16/2023 PCP: Sheliah Hatch, MD Referred by: Sheliah Hatch, MD  Subjective: Chief Complaint  Patient presents with   Left Knee - Follow-up   HPI: Jordan Vang is a pleasant 58 y.o. female who presents today for follow-up of left knee pain with OA.  She has bilateral knee OA with L > R knee pain. She is s/p PRP injection into the left knee on 05/09/23. She unfortunately did not receive a significant amount of benefit from this.  She did consider the SKOAP trial in the past but did not move forward with this. Right knee is doing pretty well at this time.  She does use meloxicam 15 mg usually around every other day and this does help her knees and her overall joint pain.  She is also inquiring about ankylosing spondylitis testing. Her sister's son has been diagnosed. Her sister is going through testing currently. Jordan Vang does report full body joint pain, with more stiffness throughout her joints.  She does note that she would like to maintain better control of her weight, she is beginning on a GLP-1 through weight watchers and is hoping to reduce her weight to help offload her joints.  Pertinent ROS were reviewed with the patient and found to be negative unless otherwise specified above in HPI.   Assessment & Plan: Visit Diagnoses:  1. Chronic low back pain, unspecified back pain laterality, unspecified whether sciatica present   2. Family history of ankylosing spondylitis   3. Generalized joint pain   4. Chronic pain of left knee   5. Bilateral primary osteoarthritis of knee    Plan: Impression is bilateral knee pain with osteoarthritis with the left knee having high-grade cartilage loss that is symptomatic.  Unfortunately she did not receive much relief from PRP injection therapy.  We discussed other options such as considering Zilretta such as a long-acting corticosteroid,  could always consider genicular nerve injection block as well for symptomatic relief.  She has gotten benefit from meloxicam 15 mg taking this every other day, she is okay to continue this for her knees and her generalized joint pain.  She will continue with healthy lifestyle changes and transition to a GLP-1 to help with weight loss and offloading the joints.  Given her family history of ankylosing spondylitis, we did obtain an x-ray of the low back which did show some degenerative change and mild arthritic changes about the spine but I could not appreciate any bamboo sign on this.  We will also move forward with drawing an HLA-B27 antigen test for screening purposes.   She will think about treatment options for the knee going forward and she will contact me or let me know if she wishes to proceed with any of these.  Follow-up: Return if symptoms worsen or fail to improve.   Meds & Orders: No orders of the defined types were placed in this encounter.   Orders Placed This Encounter  Procedures   XR Lumbar Spine 2-3 Views   HLA-B27 antigen     Procedures: No procedures performed      Clinical History: No specialty comments available.  She reports that she has never smoked. She has never used smokeless tobacco. No results for input(s): "HGBA1C", "LABURIC" in the last 8760 hours.  Objective:   Vital Signs: LMP 08/27/2016   Physical Exam  Gen: Well-appearing, in no acute distress;  non-toxic CV: Well-perfused. Warm.  Resp: Breathing unlabored on room air; no wheezing. Psych: Fluid speech in conversation; appropriate affect; normal thought process  Ortho Exam - Bilateral knees: Left knee with a trace effusion.  There is restriction in range of motion of the left knee from about 6-115 degrees compared to 0-125 degrees of the right knee.  There is patellar crepitus noted.   Imaging: XR Lumbar Spine 2-3 Views Result Date: 07/16/2023 2 views of the lumbar spine including AP and lateral film  were ordered and reviewed by myself.  Very mild degenerative left-sided curve on AP view.  There is arthritic change with DDD noted throughout the lumbar spine with anterior spurring from T12-L2 most notably.  There is a grade 1 L5 retrolisthesis.  Mild facet arthropathy at the lower levels.  *I did personally review both the right and left knee MRI today during the office visit.  Narrative & Impression  CLINICAL DATA:  Right knee pain   EXAM: MRI OF THE RIGHT KNEE WITHOUT CONTRAST   TECHNIQUE: Multiplanar, multisequence MR imaging of the right was performed. No intravenous contrast was administered.   COMPARISON:  Radiographs dated December 06, 2021   FINDINGS: MENISCI   Medial: Degenerative changes without tear.   Lateral: Intact.   LIGAMENTS   Cruciates: ACL and PCL are intact.   Collaterals: Medial collateral ligament is intact. Lateral collateral ligament complex is intact.   CARTILAGE   Patellofemoral: Near complete loss of the articular cartilage with subchondral cystic changes/edema and prominent marginal osteophytes.   Medial: Mild partial-thickness cartilage loss of the medial femorotibial compartment.   Lateral: Full-thickness cartilage defect at the weight-bearing area with subchondral edema of the femoral condyle.   JOINT: Moderate joint effusion. Normal Hoffa's fat-pad. No plical thickening. Intra-articular calcified loose body about the posterior aspect of the intercondylar fossa measuring approximately 0.8 cm.   POPLITEAL FOSSA: Popliteus tendon is intact. No Baker's cyst.   EXTENSOR MECHANISM: Intact quadriceps tendon. Intact patellar tendon. Intact lateral patellar retinaculum. Intact medial patellar retinaculum. Intact MPFL.   BONES: No aggressive osseous lesion. No fracture or dislocation. Tricompartmental marginal osteophytes.   Other: No fluid collection or hematoma. Muscles are normal.   IMPRESSION: 1. Moderate to severe patellofemoral  osteoarthritis with full-thickness cartilage loss, subchondral cystic changes/edema and prominent marginal osteophytes. 2. Mild-to-moderate lateral tibiofemoral osteoarthritis with full-thickness cartilage defect at the weight-bearing area with subchondral edema. 3. Mild medial tibiofemoral osteoarthritis. 4. Moderate knee joint effusion with intra-articular calcified loose body about the posterior aspect of the intercondylar fossa measuring approximately 0.8 cm. 5. Cruciate and collateral ligaments are intact. Quadriceps tendon and patellar tendon are also intact. No evidence of fracture or osteonecrosis.     Electronically Signed   By: Larose Hires D.O.   On: 01/17/2022 16:27   Narrative & Impression  CLINICAL DATA:  Chronic left knee pain and weakness. Decreased mobility.   EXAM: MRI OF THE LEFT KNEE WITHOUT CONTRAST   TECHNIQUE: Multiplanar, multisequence MR imaging of the knee was performed. No intravenous contrast was administered.   COMPARISON:  None Available.   FINDINGS: MENISCI   Medial: Large radial tear of the posterior horn of the medial meniscus with peripheral meniscal extrusion. Degeneration of the posterior horn of the medial meniscus.   Lateral: Intact.   LIGAMENTS   Cruciates: Intact ACL and PCL. Severe expansion of the ACL and increased signal as can be seen with mucinous degeneration.   Collaterals: Medial collateral ligament is intact. Lateral collateral ligament  complex is intact.   CARTILAGE   Patellofemoral: Partial-thickness cartilage loss of the patellofemoral compartment with areas of full-thickness cartilage loss. Subchondral reactive marrow changes in the lateral patellofemoral compartment. Small marginal osteophytes.   Medial: Partial-thickness cartilage loss of the medial femorotibial compartment with areas of high-grade partial-thickness cartilage loss of the weight-bearing surface of the medial femoral condyle. Small  marginal osteophytes.   Lateral: Partial thickness cartilage loss of the lateral femorotibial compartment with areas of full-thickness cartilage fissuring along the lateral tibial plateau. Small marginal osteophytes.   JOINT: No joint effusion. Normal Hoffa's fat-pad. No plical thickening. 14 mm ganglion cyst along the posterior joint capsule.   POPLITEAL FOSSA: Popliteus tendon is intact. No Baker's cyst.   EXTENSOR MECHANISM: Intact quadriceps tendon. Intact patellar tendon. Intact lateral patellar retinaculum. Intact medial patellar retinaculum. Intact MPFL.   BONES: No aggressive osseous lesion. No fracture or dislocation.   Other: No fluid collection or hematoma. Muscles are normal.   IMPRESSION: 1. Large radial tear of the posterior horn of the medial meniscus with peripheral meniscal extrusion. 2. Tricompartmental cartilage abnormalities as described above.     Electronically Signed   By: Elige Ko M.D.   On: 02/01/2022 08:57    Past Medical/Family/Surgical/Social History: Medications & Allergies reviewed per EMR, new medications updated. Patient Active Problem List   Diagnosis Date Noted   Vitamin D deficiency 12/07/2022   Status post total replacement of right hip 05/24/2022   Chronic pain of both knees 12/05/2021   Right hip pain 12/05/2021   Physical exam 11/28/2021   Acquired hypothyroidism 05/30/2021   Adverse effect of antihyperlipidemic and antiarteriosclerotic drugs, initial encounter 05/30/2021   Allergic rhinitis 05/30/2021   Chronic pain syndrome 05/30/2021   Dyslipidemia 05/30/2021   Myalgia 05/30/2021   Primary osteoarthritis 05/30/2021   Recurrent major depression in remission (HCC) 05/30/2021   Solitary pulmonary nodule 05/30/2021   Sleep pattern disturbance 05/30/2021   OSA (obstructive sleep apnea) 05/30/2021   Status post left hip replacement 08/02/2020   B12 deficiency 11/22/2015   Fatigue 08/16/2015   Basal cell carcinoma, lip  10/28/2013   Morbid obesity (HCC) 10/28/2013   Past Medical History:  Diagnosis Date   Allergy    Anemia    Arthritis    Cancer (HCC)    basal cell cancers, MOH's on lip, several on back   Elevated cholesterol    Hypothyroidism    PMDD (premenstrual dysphoric disorder)    Dr Audie Box   Sleep apnea    CPAP   Thyroid disease    Family History  Problem Relation Age of Onset   Hearing loss Mother    Depression Mother    Cancer Mother    Breast cancer Mother        Age 2   Lung cancer Mother        smoker   Osteoporosis Mother    Arthritis Father    Hyperlipidemia Father    Stroke Father 80   Hyperlipidemia Sister    Hyperlipidemia Brother    Heart attack Maternal Grandfather        < 55 ?   Diabetes Neg Hx    Colon cancer Neg Hx    Past Surgical History:  Procedure Laterality Date   BASAL CELL CARCINOMA EXCISION     Mohs surgery on Lip   KNEE SURGERY Right 04/23/2013   arthroscopy   TONSILLECTOMY     TOTAL HIP ARTHROPLASTY Left 08/02/2020   Procedure: LEFT TOTAL HIP ARTHROPLASTY  ANTERIOR APPROACH;  Surgeon: Kathryne Hitch, MD;  Location: [facility] OR;  Service: Orthopedics;  Laterality: Left;   TOTAL HIP ARTHROPLASTY Right 05/11/2022   Procedure: RIGHT TOTAL HIP ARTHROPLASTY ANTERIOR APPROACH;  Surgeon: Kathryne Hitch, MD;  Location: [facility] ORS;  Service: Orthopedics;  Laterality: Right;   Social History   Occupational History   Not on file  Tobacco Use   Smoking status: Never   Smokeless tobacco: Never  Vaping Use   Vaping status: Never Used  Substance and Sexual Activity   Alcohol use: Yes    Alcohol/week: 0.0 standard drinks of alcohol    Comment: socially   Drug use: No   Sexual activity: Yes    Birth control/protection: Post-menopausal    Comment: intercourse age 54, sexual partners less than 5

## 2023-07-16 NOTE — Progress Notes (Signed)
 Patient says that she does not feel the PRP injection worked. She says that she began taking Meloxicam again which did help quite a bit; she stopped taking it on Friday for this appointment and does notice a difference. She says that she feels more stiffness without it and that the knee feels more full. She also mentions stiffness in other joints and body parts. Patient says that her sister is being tested for Ankylosing Spondylitis and that her sister's son has been diagnosed. She says that she will begin taking an equivalent of Ozempic beginning on Monday.

## 2023-07-17 LAB — HLA-B27 ANTIGEN: HLA-B27 Antigen: POSITIVE — AB

## 2023-07-18 ENCOUNTER — Encounter: Payer: Self-pay | Admitting: Sports Medicine

## 2023-07-26 LAB — GENECONNECT MOLECULAR SCREEN: Genetic Analysis Overall Interpretation: NEGATIVE

## 2023-09-29 ENCOUNTER — Other Ambulatory Visit: Payer: Self-pay | Admitting: Physician Assistant

## 2023-11-05 ENCOUNTER — Other Ambulatory Visit: Payer: Self-pay | Admitting: Family Medicine

## 2023-11-05 DIAGNOSIS — Z1231 Encounter for screening mammogram for malignant neoplasm of breast: Secondary | ICD-10-CM

## 2023-12-01 ENCOUNTER — Other Ambulatory Visit: Payer: Self-pay | Admitting: Family Medicine

## 2024-01-06 ENCOUNTER — Ambulatory Visit
Admission: RE | Admit: 2024-01-06 | Discharge: 2024-01-06 | Disposition: A | Discharge status: Home / Self Care | Source: Ambulatory Visit | Attending: Family Medicine | Admitting: Family Medicine

## 2024-01-06 DIAGNOSIS — Z1231 Encounter for screening mammogram for malignant neoplasm of breast: Secondary | ICD-10-CM

## 2024-01-14 ENCOUNTER — Other Ambulatory Visit (HOSPITAL_COMMUNITY)
Admission: RE | Admit: 2024-01-14 | Discharge: 2024-01-14 | Disposition: A | Source: Ambulatory Visit | Attending: Nurse Practitioner | Admitting: Nurse Practitioner

## 2024-01-14 ENCOUNTER — Encounter: Payer: Self-pay | Admitting: Nurse Practitioner

## 2024-01-14 ENCOUNTER — Ambulatory Visit (INDEPENDENT_AMBULATORY_CARE_PROVIDER_SITE_OTHER): Admitting: Nurse Practitioner

## 2024-01-14 VITALS — BP 104/62 | HR 91 | Ht 62.5 in | Wt 217.0 lb

## 2024-01-14 DIAGNOSIS — Z124 Encounter for screening for malignant neoplasm of cervix: Secondary | ICD-10-CM | POA: Diagnosis present

## 2024-01-14 DIAGNOSIS — Z780 Asymptomatic menopausal state: Secondary | ICD-10-CM | POA: Diagnosis not present

## 2024-01-14 DIAGNOSIS — Z01419 Encounter for gynecological examination (general) (routine) without abnormal findings: Secondary | ICD-10-CM | POA: Insufficient documentation

## 2024-01-14 DIAGNOSIS — Z1331 Encounter for screening for depression: Secondary | ICD-10-CM | POA: Diagnosis not present

## 2024-01-14 NOTE — Progress Notes (Signed)
 Jordan Vang June 04, 1965 [id number]   History:  58 y.o. G1P1001 presents for annual exam. Postmenopausal - no HRT. Normal pap history. History of basal cell carcinoma, hypothyroidism, HLD, PMDD.  Gynecologic History Patient's last menstrual period was 08/27/2016.   Contraception/Family planning: post menopausal status Sexually active: Yes  Health Maintenance Last Pap: 10/31/2018. Results were: Normal neg HPV, 5-year repeat Last mammogram: 01/06/2024. Results were: Normal Last colonoscopy: 10/20/2015. Results were: Normal, 10-year recall Last Dexa: 01/30/2022. Results were: Normal     01/14/2024   12:03 PM  Depression screen PHQ 2/9  Decreased Interest 0  Down, Depressed, Hopeless 0  PHQ - 2 Score 0     Past medical history, past surgical history, family history and social history were all reviewed and documented in the EPIC chart. Married. Retired. Volunteers for church. 61 yo son, working as Agricultural consultant in [employer]. Mother diagnosed with breast cancer at age 42, osteoporosis.   ROS:  A ROS was performed and pertinent positives and negatives are included.  Exam:  Vitals:   01/14/24 1204  BP: 104/62  Pulse: 91  SpO2: 97%  Weight: 217 lb (98.4 kg)  Height: 5' 2.5 (1.588 m)      Body mass index is 39.06 kg/m.  General appearance:  Normal Thyroid :  Symmetrical, normal in size, without palpable masses or nodularity. Respiratory  Auscultation:  Clear without wheezing or rhonchi Cardiovascular  Auscultation:  Regular rate, without rubs, murmurs or gallops  Edema/varicosities:  Not grossly evident Abdominal  Soft,nontender, without masses, guarding or rebound.  Liver/spleen:  No organomegaly noted  Hernia:  None appreciated  Skin  Inspection:  Grossly normal Breasts: Examined lying and sitting.   Right: Without masses, retractions, nipple discharge or axillary adenopathy.   Left: Without masses, retractions, nipple discharge or axillary adenopathy. Pelvic:  External genitalia:  no lesions              Urethra:  normal appearing urethra with no masses, tenderness or lesions              Bartholins and Skenes: normal                 Vagina: normal appearing vagina with normal color and discharge, no lesions              Cervix: no lesions Bimanual Exam:  Uterus:  no masses or tenderness              Adnexa: no mass, fullness, tenderness              Rectovaginal: Deferred              Anus:  normal sphincter tone, no lesions  Jordan Vang, CMA present as chaperone.   Assessment/Plan:  58 y.o. G1P1001 for annual exam.   Well female exam with routine gynecological exam - Education provided on SBEs, importance of preventative screenings, current guidelines, high calcium  diet, regular exercise, and multivitamin daily. Labs with PCP.  Postmenopausal - No HRT, no bleeding  Cervical cancer screening - Plan: Cytology - PAP( ). Normal Pap history. Pap today per guidelines.   Screening for breast cancer - Normal mammogram history.  Continue annual screenings.  Normal breast exam today.  Screening for colon cancer - 2017 colonoscopy. Will repeat at GI's recommended interval.   Screening for osteoporosis - Normal bone density 01/2022. Will repeat DXA in 5 years. Mother with history of osteoporosis, but she was a smoker.   Return in about  1 year (around 01/13/2025) for Annual.     Jordan DELENA Shutter DNP, 12:31 PM 01/14/2024

## 2024-01-17 LAB — CYTOLOGY - PAP
Comment: NEGATIVE
Diagnosis: UNDETERMINED — AB
High risk HPV: NEGATIVE

## 2024-01-20 ENCOUNTER — Ambulatory Visit: Payer: Self-pay | Admitting: Nurse Practitioner

## 2024-01-21 ENCOUNTER — Other Ambulatory Visit: Payer: Self-pay | Admitting: Sports Medicine

## 2024-02-24 ENCOUNTER — Encounter: Payer: Self-pay | Admitting: Radiology

## 2024-02-28 ENCOUNTER — Other Ambulatory Visit: Payer: Self-pay | Admitting: Family Medicine

## 2024-02-28 DIAGNOSIS — E039 Hypothyroidism, unspecified: Secondary | ICD-10-CM

## 2024-04-09 ENCOUNTER — Other Ambulatory Visit: Payer: Self-pay | Admitting: Sports Medicine

## 2024-04-21 ENCOUNTER — Encounter: Payer: Self-pay | Admitting: Family Medicine

## 2024-04-29 ENCOUNTER — Encounter: Payer: Self-pay | Admitting: Family Medicine

## 2024-04-29 ENCOUNTER — Ambulatory Visit: Admitting: Family Medicine

## 2024-04-29 VITALS — BP 120/80 | HR 76 | Wt 213.0 lb

## 2024-04-29 DIAGNOSIS — E785 Hyperlipidemia, unspecified: Secondary | ICD-10-CM | POA: Diagnosis not present

## 2024-04-29 DIAGNOSIS — M25561 Pain in right knee: Secondary | ICD-10-CM

## 2024-04-29 DIAGNOSIS — R5383 Other fatigue: Secondary | ICD-10-CM

## 2024-04-29 DIAGNOSIS — G8929 Other chronic pain: Secondary | ICD-10-CM

## 2024-04-29 DIAGNOSIS — M25562 Pain in left knee: Secondary | ICD-10-CM | POA: Diagnosis not present

## 2024-04-29 NOTE — Assessment & Plan Note (Signed)
 Ongoing issue.  Current BMI 38 but coupled w/ hyperlipidemia, this qualifies as morbidly obese.  Check labs to risk stratify.  Will follow.

## 2024-04-29 NOTE — Assessment & Plan Note (Signed)
 Chronic problem.  On Crestor 20mg  daily w/o difficulty.  Check labs.  Adjust meds prn

## 2024-04-29 NOTE — Assessment & Plan Note (Signed)
 Ongoing issue.  Pt is interested in exploring low dose radiation therapy rather than knee replacement.  Referral placed.

## 2024-04-29 NOTE — Assessment & Plan Note (Signed)
 Pt has hx of similar.  Feels sxs have worsened since starting GLP1.  Will check labs to r/o possible underlying cause.

## 2024-04-29 NOTE — Progress Notes (Signed)
" ° °  Subjective:    Patient ID: Jordan Vang, female    DOB: October 11, 1965, 59 y.o.   MRN: [id number]  HPI Knee pain- ongoing issue but worsening.  Bilateral.  Friend encouraged her try 'low dose radiation therapy'.  Needs referral to radiation oncology.  Fatigue- ongoing issue for pt.  Feels that GLP1 may be contributing.  Prescriber encouraged her to check labs.  Hyperlipidemia- chronic problem, on Crestor  20mg  daily.  Denies abd pain, N/V.  Morbid obesity- BMI 38.  Currently on Zepbound  thru weight watchers.   Review of Systems For ROS see HPI     Objective:   Physical Exam Vitals reviewed.  Constitutional:      General: She is not in acute distress.    Appearance: Normal appearance. She is well-developed. She is obese. She is not ill-appearing.  HENT:     Head: Normocephalic and atraumatic.  Eyes:     Conjunctiva/sclera: Conjunctivae normal.     Pupils: Pupils are equal, round, and reactive to light.  Neck:     Thyroid : No thyromegaly.  Cardiovascular:     Rate and Rhythm: Normal rate and regular rhythm.     Pulses: Normal pulses.     Heart sounds: Normal heart sounds. No murmur heard. Pulmonary:     Effort: Pulmonary effort is normal. No respiratory distress.     Breath sounds: Normal breath sounds.  Abdominal:     General: There is no distension.     Palpations: Abdomen is soft.     Tenderness: There is no abdominal tenderness.  Musculoskeletal:     Cervical back: Normal range of motion and neck supple.     Right lower leg: No edema.     Left lower leg: No edema.  Lymphadenopathy:     Cervical: No cervical adenopathy.  Skin:    General: Skin is warm and dry.  Neurological:     General: No focal deficit present.     Mental Status: She is alert and oriented to person, place, and time.  Psychiatric:        Mood and Affect: Mood normal.        Behavior: Behavior normal.        Thought Content: Thought content normal.           Assessment & Plan:    "

## 2024-04-29 NOTE — Patient Instructions (Signed)
 Schedule your complete physical in 6 months Schedule a lab visit at your convenience We'll call you to schedule your radiation consultation Continue to work on healthy diet and regular exercise- you can do it! Call with any questions or concerns Stay Safe!  Stay Healthy! Happy New Year!!!

## 2024-04-30 ENCOUNTER — Other Ambulatory Visit (INDEPENDENT_AMBULATORY_CARE_PROVIDER_SITE_OTHER)

## 2024-04-30 DIAGNOSIS — R5383 Other fatigue: Secondary | ICD-10-CM | POA: Diagnosis not present

## 2024-04-30 DIAGNOSIS — E785 Hyperlipidemia, unspecified: Secondary | ICD-10-CM

## 2024-04-30 LAB — CBC WITH DIFFERENTIAL/PLATELET
Basophils Absolute: 0.1 K/uL (ref 0.0–0.1)
Basophils Relative: 0.7 % (ref 0.0–3.0)
Eosinophils Absolute: 0.2 K/uL (ref 0.0–0.7)
Eosinophils Relative: 2.4 % (ref 0.0–5.0)
HCT: 39.4 % (ref 36.0–46.0)
Hemoglobin: 13.1 g/dL (ref 12.0–15.0)
Lymphocytes Relative: 30.8 % (ref 12.0–46.0)
Lymphs Abs: 2.4 K/uL (ref 0.7–4.0)
MCHC: 33.2 g/dL (ref 30.0–36.0)
MCV: 82.2 fl (ref 78.0–100.0)
Monocytes Absolute: 0.5 K/uL (ref 0.1–1.0)
Monocytes Relative: 6.7 % (ref 3.0–12.0)
Neutro Abs: 4.6 K/uL (ref 1.4–7.7)
Neutrophils Relative %: 59.4 % (ref 43.0–77.0)
Platelets: 240 K/uL (ref 150.0–400.0)
RBC: 4.8 Mil/uL (ref 3.87–5.11)
RDW: 14.2 % (ref 11.5–15.5)
WBC: 7.8 K/uL (ref 4.0–10.5)

## 2024-04-30 LAB — HEPATIC FUNCTION PANEL
ALT: 24 U/L (ref 3–35)
AST: 17 U/L (ref 5–37)
Albumin: 4.4 g/dL (ref 3.5–5.2)
Alkaline Phosphatase: 52 U/L (ref 39–117)
Bilirubin, Direct: 0.1 mg/dL (ref 0.1–0.3)
Total Bilirubin: 0.5 mg/dL (ref 0.2–1.2)
Total Protein: 6.6 g/dL (ref 6.0–8.3)

## 2024-04-30 LAB — LIPID PANEL
Cholesterol: 113 mg/dL (ref 28–200)
HDL: 50.7 mg/dL
LDL Cholesterol: 44 mg/dL (ref 10–99)
NonHDL: 61.84
Total CHOL/HDL Ratio: 2
Triglycerides: 90 mg/dL (ref 10.0–149.0)
VLDL: 18 mg/dL (ref 0.0–40.0)

## 2024-04-30 LAB — VITAMIN D 25 HYDROXY (VIT D DEFICIENCY, FRACTURES): VITD: 43.06 ng/mL (ref 30.00–100.00)

## 2024-04-30 LAB — BASIC METABOLIC PANEL WITH GFR
BUN: 16 mg/dL (ref 6–23)
CO2: 29 meq/L (ref 19–32)
Calcium: 9.4 mg/dL (ref 8.4–10.5)
Chloride: 107 meq/L (ref 96–112)
Creatinine, Ser: 0.71 mg/dL (ref 0.40–1.20)
GFR: 93.23 mL/min
Glucose, Bld: 80 mg/dL (ref 70–99)
Potassium: 4.6 meq/L (ref 3.5–5.1)
Sodium: 142 meq/L (ref 135–145)

## 2024-04-30 LAB — HEMOGLOBIN A1C: Hgb A1c MFr Bld: 5.6 % (ref 4.6–6.5)

## 2024-04-30 LAB — B12 AND FOLATE PANEL
Folate: >23.7 ng/mL
Vitamin B-12: 365 pg/mL (ref 211–911)

## 2024-04-30 LAB — TSH: TSH: 3.21 u[IU]/mL (ref 0.35–5.50)

## 2024-05-01 ENCOUNTER — Ambulatory Visit: Payer: Self-pay | Admitting: Family Medicine

## 2024-05-18 ENCOUNTER — Ambulatory Visit

## 2024-05-18 ENCOUNTER — Ambulatory Visit: Admitting: Radiation Oncology

## 2024-05-19 DIAGNOSIS — M170 Bilateral primary osteoarthritis of knee: Secondary | ICD-10-CM | POA: Insufficient documentation

## 2024-05-19 NOTE — Progress Notes (Signed)
 " Radiation Oncology         [phone] ________________________________  Name: Jordan Vang        MRN: [id number]  Date of Service: 05/21/2024 DOB: June 24, 1965  RR:Ujanmp, Comer BRAVO, MD  Mahlon Comer BRAVO, MD     REFERRING PHYSICIAN: Mahlon Comer BRAVO, MD   DIAGNOSIS: The encounter diagnosis was Primary osteoarthritis of both knees. M17.0    HISTORY OF PRESENT ILLNESS: Jordan Vang is a 59 y.o. female seen in consultation for radiation therapy.  She has a hx of chronic, progressive bilateral knee osteoarthritis, L greater than R, which has worsened overtime and now significantly limits daily function. Her pain has been described as achy with occasional radiation. It is localized to the joint and patella and exacerbated by standing and walking, with partial relief at rest. She wakes up with pain.   Her pain has resulted in decreased mobility, reduced range of motion and strength, difficulty with prolonged standing and walking, and limitations in activities of daily living, including stairs, transfers, household tasks, driving and community participation. Imaging obtained of the L knee in November 2024 which demonstrated a small suprapatellar joint effusion and patellar spurring.   She has pursued multiple conservative therapies without adequate or durable relief. She was referred to physical therapy in January 2025 and completed aquatic therapy with good tolerance, however, ongoing visits were limited by financial constraints. She continues a home exercise and aquatic program independently. She has undergone corticosteroid injections with limited benefit. She continues meloxicam  per orthopedics, though pain remains inadequately controlled.   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   Elevated cholesterol    Hypothyroidism    PMDD (premenstrual dysphoric disorder)    Dr Rockney   Sleep apnea    CPAP   Thyroid  disease        PAST SURGICAL HISTORY: Past Surgical History:  Procedure Laterality Date   BASAL CELL CARCINOMA EXCISION     Mohs surgery on Lip   KNEE SURGERY Right 04/23/2013   arthroscopy   TONSILLECTOMY     TOTAL HIP ARTHROPLASTY Left 08/02/2020   Procedure: LEFT TOTAL HIP ARTHROPLASTY ANTERIOR APPROACH;  Surgeon: Vernetta Lonni GRADE, MD;  Location: [facility] OR;  Service: Orthopedics;  Laterality: Left;   TOTAL HIP ARTHROPLASTY Right 05/11/2022   Procedure: RIGHT TOTAL HIP ARTHROPLASTY ANTERIOR APPROACH;  Surgeon: Vernetta Lonni GRADE, MD;  Location: [facility];  Service: Orthopedics;  Laterality: Right;     FAMILY HISTORY:  Family History  Problem Relation Age of Onset   Hearing loss Mother    Depression Mother    Cancer Mother    Breast cancer Mother        Age 76   Lung cancer Mother        smoker   Osteoporosis Mother    Arthritis Father    Hyperlipidemia Father    Stroke Father 13   Hyperlipidemia Sister    Ankylosing spondylitis Sister    Hyperlipidemia Brother    Heart attack Maternal Grandfather        < 55 ?   Diabetes Neg Hx    Colon cancer Neg Hx      SOCIAL HISTORY:  reports that she has never smoked. She has never used smokeless tobacco. She reports current alcohol  use. She reports that she does not use drugs.   ALLERGIES: Azithromycin , Naproxen, Atorvastatin , Celebrex [celecoxib], and Tape   MEDICATIONS:  Current Outpatient Medications  Medication Sig Dispense Refill   Calcium  Carb-Cholecalciferol  (CALCIUM  600 + D PO) Take 1 tablet by mouth at bedtime.     carboxymethylcellulose (REFRESH PLUS) 0.5 % SOLN Place 1 drop into both eyes 3 (three) times daily as needed (dry eyes).     Cholecalciferol   (VITAMIN D ) 50 MCG (2000 UT) tablet Take 2,000 Units by mouth daily.     COLLAGEN PO Take 1 capsule by mouth daily.     ketotifen (ZADITOR) 0.025 % ophthalmic solution Place 1 drop into both eyes 2 (two) times daily as needed (allergies).     levothyroxine  (SYNTHROID ) 50 MCG tablet TAKE ONE TABLET BY MOUTH DAILY 60 tablet 3   loratadine (CLARITIN) 10 MG tablet Take 10 mg by mouth daily. (Patient not taking: Reported on 04/29/2024)     Magnesium Bisglycinate (MAG GLYCINATE PO) Take 120 mg by mouth daily.     melatonin 3 MG TABS tablet Take 3 mg by mouth at bedtime.     meloxicam  (MOBIC ) 15 MG tablet TAKE ONE TABLET BY MOUTH DAILY 30 tablet 1   Multiple Vitamin (MULTIVITAMIN WITH MINERALS) TABS tablet Take 1 tablet by mouth daily.     Omega-3 Fatty Acids (FISH OIL) 1200 MG CAPS Take 1,200 mg by mouth daily.     rosuvastatin  (CRESTOR ) 20 MG tablet TAKE ONE TABLET BY MOUTH DAILY 90 tablet 3   sertraline  (ZOLOFT ) 50 MG tablet TAKE ONE TABLET BY MOUTH DAILY 90 tablet 3   tirzepatide  (ZEPBOUND ) 2.5 MG/0.5ML injection vial Inject 2.5 mg into the skin once a week. 2 mL 1   triamcinolone  cream (KENALOG ) 0.1 % Apply 1 application topically 2 (two) times daily as needed (itching).     No current facility-administered medications for this visit.     REVIEW OF SYSTEMS: The patient reports that s***/he is doing well overall and a review of symptoms is otherwise negative.      PHYSICAL EXAM:  Wt Readings from Last 3 Encounters:  04/29/24 213 lb (96.6 kg)  01/14/24 217 lb (98.4 kg)  05/22/23 232 lb 2 oz (105.3 kg)   Temp Readings from Last 3 Encounters:  05/22/23 98.4 F (36.9 C)  12/07/22 98.1 F (36.7 C) (Temporal)  09/07/22 98.8 F (37.1 C) (Temporal)   BP Readings from Last 3 Encounters:  04/29/24 120/80  01/14/24 104/62  05/22/23 132/72   Pulse Readings from Last 3 Encounters:  04/29/24 76  01/14/24 91  05/22/23 74    /10   Physical Exam    LABORATORY DATA:  Lab Results   Component Value Date   WBC 7.8 04/30/2024   HGB 13.1 04/30/2024   HCT 39.4 04/30/2024   MCV 82.2 04/30/2024   PLT 240.0 04/30/2024   Lab Results  Component Value Date   NA 142 04/30/2024   K 4.6 04/30/2024   CL 107 04/30/2024   CO2 29 04/30/2024   Lab Results  Component Value Date   ALT 24 04/30/2024   AST 17 04/30/2024   ALKPHOS 52 04/30/2024   BILITOT 0.5 04/30/2024      RADIOGRAPHY:   L Knee US  02/2023:  Described as follows: Evaluation of the anterior knee shows a small joint effusion in the suprapatellar pouch. There is spurring on both  ends and the undersurface of the patella. Overlying quadricep tendon intact. Evaluation of the posterior capsule shows no Baker's cyst. There is a mixed echogenic structure that is approximately 1.25 x 0.9 cm in nature, likely favoring a ganglion cyst given the location.   PATHOLOGY: no pertinent pathology     IMPRESSION/PLAN:   After a detailed discussion of the patients history, prior treatment response, and current goals, we reviewed the proposed protocol for LDRT targeting the bilateral knees. Our institutional approach follows a regimen of 3 Gy total, delivered in 6 fractions of 0.5 Gy per fraction over 2-3 weeks. This dosing is consistent with published guidelines and peer-reviewed data for non-malignant musculoskeletal conditions.  We reviewed:   Goals of care: symptom relief and improved mobility Expected timeline for therapeutic benefit: typically several weeks to months Potential risks, including rare but theoretical concerns regarding late tissue effects or secondary malignancy (risk estimated to be extremely low at this dose and age) Lack of immunosuppression and inactive autoimmune history, minimizing concern for immune-mediated complications   My goal with low dose radiation therapy is to help reduce chronic joint pain and improve mobility, particularly for activities like standing, walking and driving. This treatment  does not reverse joint damage, but it can calm the inflammation in and around the joint that contributes to pain. Based on published data, about 60-80% of patients with osteoarthritis experience meaningful pain relief within a few weeks to months after completing a short course of low-dose radiation. While not everyone responds, the majority of patients do report improvement, and the treatment is generally well tolerated.  The patient was agreeable to proceed. We will arrange CT simulation for planning and initiate treatment pending final review and setup. A follow-up visit will be scheduled approximately 8-12 weeks after completion of LDRT to assess clinical response.   We personally spent 60 minutes in this encounter including chart review, reviewing radiological studies, meeting face-to-face with the patient, entering orders and completing documentation.    Estefana HERO. Maritza, M.D.   "

## 2024-05-20 NOTE — Progress Notes (Signed)
 Site of osteoarthritis:  Primary osteoarthritis of both knees. M17.0   How long have you had pain? Approximately 3-4 years.  What over the counter or prescription medications have you tried?    .  Does anything make the pain better or worse? exacerbated by standing and walking, with partial relief at rest. She wakes up with pain.  She continues a home exercise and aquatic program independently. She has undergone corticosteroid injections with limited benefit. She continues meloxicam  per orthopedics, though pain remains inadequately controlled.       Wt Readings from Last 4 Encounters: 05/21/2024      208.6 lb  04/29/24 213 lb (96.6 kg)  01/14/24 217 lb (98.4 kg)  05/22/23 232 lb 2 oz (105.3 kg)    Ambulatory status? Walker? Wheelchair?: Ambulatory  SAFETY ISSUES: Prior radiation? No Pacemaker/ICD? No Possible current pregnancy? No Is the patient on methotrexate? No  Current Complaints / other details:

## 2024-05-21 ENCOUNTER — Encounter: Payer: Self-pay | Admitting: Radiation Oncology

## 2024-05-21 ENCOUNTER — Ambulatory Visit
Admission: RE | Admit: 2024-05-21 | Discharge: 2024-05-21 | Disposition: A | Discharge status: Home / Self Care | Source: Ambulatory Visit | Attending: Radiation Oncology | Admitting: Radiation Oncology

## 2024-05-21 ENCOUNTER — Telehealth: Payer: Self-pay | Admitting: Radiation Oncology

## 2024-05-21 VITALS — BP 141/88 | HR 73 | Temp 97.5°F | Resp 18 | Ht 62.5 in | Wt 208.6 lb

## 2024-05-21 DIAGNOSIS — X501XXA Overexertion from prolonged static or awkward postures, initial encounter: Secondary | ICD-10-CM | POA: Insufficient documentation

## 2024-05-21 DIAGNOSIS — D649 Anemia, unspecified: Secondary | ICD-10-CM | POA: Insufficient documentation

## 2024-05-21 DIAGNOSIS — Z801 Family history of malignant neoplasm of trachea, bronchus and lung: Secondary | ICD-10-CM | POA: Insufficient documentation

## 2024-05-21 DIAGNOSIS — M170 Bilateral primary osteoarthritis of knee: Secondary | ICD-10-CM

## 2024-05-21 DIAGNOSIS — Z85828 Personal history of other malignant neoplasm of skin: Secondary | ICD-10-CM | POA: Insufficient documentation

## 2024-05-21 DIAGNOSIS — S83241A Other tear of medial meniscus, current injury, right knee, initial encounter: Secondary | ICD-10-CM | POA: Insufficient documentation

## 2024-05-21 DIAGNOSIS — S83242A Other tear of medial meniscus, current injury, left knee, initial encounter: Secondary | ICD-10-CM | POA: Insufficient documentation

## 2024-05-21 DIAGNOSIS — E7800 Pure hypercholesterolemia, unspecified: Secondary | ICD-10-CM | POA: Insufficient documentation

## 2024-05-21 DIAGNOSIS — Z803 Family history of malignant neoplasm of breast: Secondary | ICD-10-CM | POA: Insufficient documentation

## 2024-05-21 DIAGNOSIS — Z79890 Hormone replacement therapy (postmenopausal): Secondary | ICD-10-CM | POA: Insufficient documentation

## 2024-05-21 DIAGNOSIS — E039 Hypothyroidism, unspecified: Secondary | ICD-10-CM | POA: Insufficient documentation

## 2024-05-21 DIAGNOSIS — F3281 Premenstrual dysphoric disorder: Secondary | ICD-10-CM | POA: Insufficient documentation

## 2024-05-21 NOTE — Telephone Encounter (Signed)
 Was asked by Dr Maritza to call patient with an estimate for her treatment.  Called patient and let her know that her out of pocket max is $7,350 and has a remaining balance of $7, 263.19.  Patient verbalized understanding.

## 2024-05-28 ENCOUNTER — Ambulatory Visit
Admission: RE | Admit: 2024-05-28 | Discharge: 2024-05-28 | Disposition: A | Source: Ambulatory Visit | Attending: Radiation Oncology | Admitting: Radiation Oncology

## 2024-05-29 ENCOUNTER — Telehealth: Payer: Self-pay | Admitting: Radiation Oncology

## 2024-05-29 NOTE — Telephone Encounter (Signed)
 2/6 Received call from patient's husband with questions about out of pocket expense for treatments.  Forward call to [facility], he knows to leave voicemail for call back.

## 2024-06-09 ENCOUNTER — Ambulatory Visit: Admitting: Radiation Oncology

## 2024-06-10 ENCOUNTER — Ambulatory Visit

## 2024-06-10 ENCOUNTER — Ambulatory Visit: Admitting: Radiation Oncology

## 2024-06-11 ENCOUNTER — Ambulatory Visit

## 2024-06-11 ENCOUNTER — Ambulatory Visit: Admitting: Radiation Oncology

## 2024-06-12 ENCOUNTER — Ambulatory Visit

## 2024-06-12 ENCOUNTER — Ambulatory Visit: Admitting: Radiation Oncology

## 2024-06-15 ENCOUNTER — Ambulatory Visit: Admitting: Radiation Oncology

## 2024-06-15 ENCOUNTER — Ambulatory Visit

## 2024-06-16 ENCOUNTER — Ambulatory Visit: Admitting: Radiation Oncology

## 2024-06-16 ENCOUNTER — Ambulatory Visit

## 2024-06-17 ENCOUNTER — Ambulatory Visit: Admitting: Radiation Oncology

## 2024-06-17 ENCOUNTER — Ambulatory Visit

## 2024-06-18 ENCOUNTER — Ambulatory Visit

## 2024-06-18 ENCOUNTER — Ambulatory Visit: Admitting: Radiation Oncology

## 2024-06-19 ENCOUNTER — Ambulatory Visit: Admitting: Radiation Oncology

## 2024-06-19 ENCOUNTER — Ambulatory Visit

## 2024-06-22 ENCOUNTER — Ambulatory Visit: Admitting: Radiation Oncology

## 2024-06-22 ENCOUNTER — Ambulatory Visit

## 2024-06-23 ENCOUNTER — Ambulatory Visit

## 2024-06-24 ENCOUNTER — Ambulatory Visit

## 2024-06-25 ENCOUNTER — Ambulatory Visit

## 2024-06-26 ENCOUNTER — Ambulatory Visit

## 2024-06-29 ENCOUNTER — Ambulatory Visit

## 2024-06-30 ENCOUNTER — Ambulatory Visit

## 2024-07-01 ENCOUNTER — Ambulatory Visit

## 2024-07-02 ENCOUNTER — Ambulatory Visit

## 2024-07-03 ENCOUNTER — Ambulatory Visit

## 2024-07-06 ENCOUNTER — Ambulatory Visit

## 2024-07-07 ENCOUNTER — Ambulatory Visit

## 2024-07-08 ENCOUNTER — Ambulatory Visit

## 2024-07-09 ENCOUNTER — Ambulatory Visit

## 2024-07-10 ENCOUNTER — Ambulatory Visit

## 2024-07-13 ENCOUNTER — Ambulatory Visit

## 2024-07-14 ENCOUNTER — Ambulatory Visit

## 2024-07-15 ENCOUNTER — Ambulatory Visit

## 2024-07-16 ENCOUNTER — Ambulatory Visit

## 2024-07-17 ENCOUNTER — Ambulatory Visit

## 2024-07-20 ENCOUNTER — Ambulatory Visit

## 2024-07-21 ENCOUNTER — Ambulatory Visit

## 2024-07-22 ENCOUNTER — Ambulatory Visit

## 2024-07-23 ENCOUNTER — Ambulatory Visit

## 2024-07-24 ENCOUNTER — Ambulatory Visit

## 2024-07-27 ENCOUNTER — Ambulatory Visit

## 2024-10-29 ENCOUNTER — Encounter: Admitting: Family Medicine
